# Patient Record
Sex: Female | Born: 1980 | Race: White | Hispanic: No | Smoking: Former smoker
Health system: Southern US, Community
[De-identification: ages and names within clinical notes are randomized; demographics above are authoritative.]

## PROBLEM LIST (undated history)

## (undated) DIAGNOSIS — F4310 Post-traumatic stress disorder, unspecified: Secondary | ICD-10-CM

## (undated) DIAGNOSIS — F329 Major depressive disorder, single episode, unspecified: Secondary | ICD-10-CM

## (undated) DIAGNOSIS — F32A Depression, unspecified: Secondary | ICD-10-CM

## (undated) DIAGNOSIS — F419 Anxiety disorder, unspecified: Secondary | ICD-10-CM

## (undated) DIAGNOSIS — R569 Unspecified convulsions: Secondary | ICD-10-CM

## (undated) DIAGNOSIS — M1990 Unspecified osteoarthritis, unspecified site: Secondary | ICD-10-CM

## (undated) HISTORY — PX: DILATION AND CURETTAGE OF UTERUS: SHX78

## (undated) HISTORY — DX: Anxiety disorder, unspecified: F41.9

## (undated) HISTORY — PX: TUBAL LIGATION: SHX77

---
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
## 2002-12-10 ENCOUNTER — Emergency Department (HOSPITAL_COMMUNITY): Admission: EM | Admit: 2002-12-10 | Discharge: 2002-12-11 | Payer: Self-pay | Admitting: Emergency Medicine

## 2002-12-11 ENCOUNTER — Encounter: Payer: Self-pay | Admitting: *Deleted

## 2004-11-07 ENCOUNTER — Emergency Department: Payer: Self-pay | Admitting: Emergency Medicine

## 2005-01-21 ENCOUNTER — Emergency Department: Payer: Self-pay | Admitting: Emergency Medicine

## 2005-06-30 ENCOUNTER — Observation Stay: Payer: Self-pay

## 2005-07-11 ENCOUNTER — Ambulatory Visit: Payer: Self-pay | Admitting: Obstetrics & Gynecology

## 2005-07-15 ENCOUNTER — Observation Stay: Payer: Self-pay

## 2005-08-26 ENCOUNTER — Inpatient Hospital Stay: Payer: Self-pay | Admitting: Unknown Physician Specialty

## 2005-12-24 ENCOUNTER — Emergency Department: Payer: Self-pay | Admitting: Emergency Medicine

## 2006-05-12 ENCOUNTER — Emergency Department: Payer: Self-pay | Admitting: Emergency Medicine

## 2006-11-25 HISTORY — PX: AUGMENTATION MAMMAPLASTY: SUR837

## 2007-07-14 ENCOUNTER — Emergency Department: Payer: Self-pay | Admitting: Emergency Medicine

## 2009-01-18 ENCOUNTER — Emergency Department (HOSPITAL_COMMUNITY): Admission: EM | Admit: 2009-01-18 | Discharge: 2009-01-18 | Payer: Self-pay | Admitting: Emergency Medicine

## 2009-05-22 ENCOUNTER — Ambulatory Visit: Payer: Self-pay | Admitting: Family Medicine

## 2010-06-14 ENCOUNTER — Emergency Department: Payer: Self-pay | Admitting: Emergency Medicine

## 2010-08-02 ENCOUNTER — Emergency Department (HOSPITAL_COMMUNITY): Admission: EM | Admit: 2010-08-02 | Discharge: 2010-08-02 | Payer: Self-pay | Admitting: Emergency Medicine

## 2011-03-12 LAB — URINE MICROSCOPIC-ADD ON

## 2011-03-12 LAB — URINALYSIS, ROUTINE W REFLEX MICROSCOPIC
Bilirubin Urine: NEGATIVE
Glucose, UA: NEGATIVE mg/dL
Ketones, ur: NEGATIVE mg/dL
Nitrite: NEGATIVE
Protein, ur: 30 mg/dL — AB
Specific Gravity, Urine: 1.02 (ref 1.005–1.030)
Urobilinogen, UA: 0.2 mg/dL (ref 0.0–1.0)
pH: 6.5 (ref 5.0–8.0)

## 2011-03-12 LAB — PREGNANCY, URINE: Preg Test, Ur: NEGATIVE

## 2012-05-11 ENCOUNTER — Encounter (HOSPITAL_COMMUNITY): Payer: Self-pay | Admitting: *Deleted

## 2012-05-11 ENCOUNTER — Emergency Department (HOSPITAL_COMMUNITY)
Admission: EM | Admit: 2012-05-11 | Discharge: 2012-05-11 | Disposition: A | Discharge status: Home / Self Care | Payer: Self-pay | Attending: Emergency Medicine | Admitting: Emergency Medicine

## 2012-05-11 DIAGNOSIS — T6391XA Toxic effect of contact with unspecified venomous animal, accidental (unintentional), initial encounter: Secondary | ICD-10-CM | POA: Insufficient documentation

## 2012-05-11 DIAGNOSIS — T63301A Toxic effect of unspecified spider venom, accidental (unintentional), initial encounter: Secondary | ICD-10-CM

## 2012-05-11 DIAGNOSIS — F17200 Nicotine dependence, unspecified, uncomplicated: Secondary | ICD-10-CM | POA: Insufficient documentation

## 2012-05-11 DIAGNOSIS — T63391A Toxic effect of venom of other spider, accidental (unintentional), initial encounter: Secondary | ICD-10-CM | POA: Insufficient documentation

## 2012-05-11 MED ORDER — DOXYCYCLINE HYCLATE 100 MG PO CAPS: 100.0000 mg | ORAL_CAPSULE | Freq: Two times a day (BID) | ORAL | Status: AC

## 2012-05-11 MED ORDER — NAPROXEN 500 MG PO TABS: 500.0000 mg | ORAL_TABLET | Freq: Two times a day (BID) | ORAL | Status: AC

## 2012-05-11 NOTE — Discharge Instructions (Signed)
Brown Recluse Spider Bite A brown recluse spider may be dark brown to light tan in color. It has a band of darker color shaped like a violin on its back. The whole spider (with legs) may grow to the size of 1 inch (2.5 cm). These spiders live undercover, outdoors, and in out-of-the-way places indoors. They can be found in the [place] on the [address], [place], and mostly in the [place]. Brown recluse spider bites can be serious and life-threatening. SYMPTOMS  Symptoms can get worse over several days and may include:  Pain at the bite site. This may begin as a small, painful blister with redness around it. The pain and sore (lesion) caused by the bite can increase and spread over time. This may result in an area of tissue death up to 12 inches (30 cm) wide.   General feeling of illness (malaise).   Nausea and vomiting.   Fever.   Body aches.  TREATMENT  Your caregiver may prescribe a drug to prevent tissue death or to treat your lesion. If a large lesion develops, surgery may be needed to remove the damaged tissue. Certain medicines and antibiotics may be prescribed depending on the severity of your illness. However, there is no single antidote to treat this bite. Treatment focuses on caring for your wound. HOME CARE INSTRUCTIONS   Do not scratch the bite area. Keep the area clean and covered with an adhesive bandage or sterile gauze bandage.   Wash the area daily in warm, soapy water.   Put ice or cool compresses on the bite area.   Put ice in a plastic bag.   Place a towel between your skin and the bag.   Leave the ice on for 20 to 30 minutes, 3 to 4 times a day or as directed.   Keep the bite area elevated above the level of your heart. This helps reduce swelling.   Take medicines as directed by your caregiver.  You may need a tetanus shot if:  You cannot remember when you had your last tetanus shot.   You have never had a tetanus shot.   The injury broke your skin.  If you  get a tetanus shot, your arm may swell, get red, and feel warm to the touch. This is common and not a problem. If you need a tetanus shot and you choose not to have one, there is a rare chance of getting tetanus. Sickness from tetanus can be serious. SEEK MEDICAL CARE IF:   Your symptoms do not improve in 24 hours or are getting worse.   You have increasing pain in the bite area.  SEEK IMMEDIATE MEDICAL CARE IF:   Your lesion appears to be getting larger (more than  inch [5 mm]), growing deeper, or looks infected.   You have chills or a fever.   You feel nauseous, vomit, have muscle aches, weakness, extreme tiredness, convulsions, or a red rash.   Your urine output decreases.   You have blood in your urine or notice other unusual bleeding.   Your skin turns yellow.  MAKE SURE YOU:   Understand these instructions.   Will watch your condition.   Will get help right away if you are not doing well or get worse.  Document Released: 11/11/2005 Document Revised: 10/31/2011 Document Reviewed: 06/12/2011 [facility] Patient Information 2012 [place], [place].  Not sure if it's a brown recluse bite oriented as a spider bite. Certainly not consistent with a black widow spider bite.  Take antibiotic as directed return for new or worse symptoms.

## 2012-05-11 NOTE — ED Notes (Signed)
"  spider bite" to rt thigh,

## 2012-05-11 NOTE — ED Provider Notes (Signed)
History  This chart was scribed for Shelda Jakes, MD by Bennett Scrape. This patient was seen in room APA11/APA11 and the patient's care was started at 1:58PM.  CSN: [id number]  Arrival date & time 05/11/12  1309   First MD Initiated Contact with Patient 05/11/12 1358      No chief complaint on file.   The history is provided by the patient. No language interpreter was used.    Kelsey Walton is a 31 y.o. female who presents to the Emergency Department complaining of a possible spider bite described as a black center with surrounding redness on her right thigh that she noticed approximately 30 minutes PTA. She denies pain but states that it is mildly itchy. She denies knowing the exact onset and states that she did not notice the bite yesterday. She denies seeing the insect that bite her. Pt states that she has been goggling her symptoms and was afraid that she was afraid that it was a brown recluse. She denies fever, abdominal pain and nausea as associated symptoms. Pt does not have a h/o chronic medical conditions. Pt is a current everyday smoker but denies alcohol use.   No current PCP.  History reviewed. No pertinent past medical history.  Past Surgical History  Procedure Date  . Tubal ligation     History reviewed. No pertinent family history.  History  Substance Use Topics  . Smoking status: Current Everyday Smoker  . Smokeless tobacco: Not on file  . Alcohol Use: No     Review of Systems  Constitutional: Negative for fever.  HENT: Negative for congestion and sore throat.   Eyes: Negative for discharge and redness.  Respiratory: Negative for cough and shortness of breath.   Cardiovascular: Negative for chest pain.  Gastrointestinal: Negative for vomiting, abdominal pain and diarrhea.  Genitourinary: Negative for dysuria.  Musculoskeletal: Negative for back pain.  Skin: Positive for wound. Negative for rash.  Neurological: Negative for dizziness, syncope and  headaches.  Psychiatric/Behavioral: Negative for behavioral problems and confusion.    Allergies  Review of patient's allergies indicates no known allergies.  Home Medications   Current Outpatient Rx  Name Route Sig Dispense Refill  . DOXYCYCLINE HYCLATE 100 MG PO CAPS Oral Take 1 capsule (100 mg total) by mouth 2 (two) times daily. 14 capsule 0  . NAPROXEN 500 MG PO TABS Oral Take 1 tablet (500 mg total) by mouth 2 (two) times daily. 14 tablet 0    Triage Vitals: BP 108/61  Pulse 90  Temp 98.3 F (36.8 C) (Oral)  Resp 20  Ht 5' (1.524 m)  Wt 112 lb (50.803 kg)  BMI 21.87 kg/m2  SpO2 100%  LMP 05/11/2012  Physical Exam  Nursing note and vitals reviewed. Constitutional: She is oriented to person, place, and time. She appears well-developed and well-nourished. No distress.  HENT:  Head: Normocephalic and atraumatic.  Eyes: Conjunctivae and EOM are normal.  Neck: Neck supple. No tracheal deviation present.  Cardiovascular: Normal rate and regular rhythm.   No murmur heard. Pulmonary/Chest: Effort normal and breath sounds normal. No respiratory distress.  Abdominal: Soft. Bowel sounds are normal.  Musculoskeletal: Normal range of motion.  Neurological: She is alert and oriented to person, place, and time.  Skin: Skin is warm and dry.       3 mm central black area with 12 mm of surrounding erythema on right thigh, no induration  Psychiatric: She has a normal mood and affect. Her behavior is normal.  ED Course  Procedures (including critical care time)  DIAGNOSTIC STUDIES: Oxygen Saturation is 100% on room air, normal by my interpretation.    COORDINATION OF CARE: 2:22PM-Discussed discharge plan of antibiotics with pt and pt agreed to plan.   Labs Reviewed - No data to display No results found.   1. Spider bite       MDM  Most likely spider bite cannot confirm does have a small area of blackness but no significant induration no purulence these 2 ports  carefully in case it is a brown recluse spider bite. Will treat patient with doxycycline patient has instructions return for any worsening symptoms also when this occurred is not known it difficult to judge timeline how much severity is occurred up to this point in time.      I personally performed the services described in this documentation, which was scribed in my presence. The recorded information has been reviewed and considered.     Shelda Jakes, MD 05/11/12 [phone]

## 2012-05-14 ENCOUNTER — Encounter (HOSPITAL_COMMUNITY): Payer: Self-pay | Admitting: *Deleted

## 2012-05-14 ENCOUNTER — Emergency Department (HOSPITAL_COMMUNITY)
Admission: EM | Admit: 2012-05-14 | Discharge: 2012-05-14 | Disposition: A | Discharge status: Home / Self Care | Payer: Self-pay | Attending: Emergency Medicine | Admitting: Emergency Medicine

## 2012-05-14 DIAGNOSIS — T63301A Toxic effect of unspecified spider venom, accidental (unintentional), initial encounter: Secondary | ICD-10-CM

## 2012-05-14 DIAGNOSIS — S90569A Insect bite (nonvenomous), unspecified ankle, initial encounter: Secondary | ICD-10-CM | POA: Insufficient documentation

## 2012-05-14 DIAGNOSIS — F17200 Nicotine dependence, unspecified, uncomplicated: Secondary | ICD-10-CM | POA: Insufficient documentation

## 2012-05-14 MED ORDER — HYDROCODONE-ACETAMINOPHEN 5-325 MG PO TABS: 1.0000 | ORAL_TABLET | Freq: Three times a day (TID) | ORAL | Status: AC | PRN

## 2012-05-14 MED ORDER — HYDROCODONE-ACETAMINOPHEN 5-325 MG PO TABS
1.0000 | ORAL_TABLET | Freq: Once | ORAL | Status: AC
  Administered 2012-05-14: 1 via ORAL
  Filled 2012-05-14: qty 1

## 2012-05-14 NOTE — ED Provider Notes (Signed)
History  This chart was scribed for Gerhard Munch, MD by Bennett Scrape. This patient was seen in room TR07C/TR07C and the patient's care was started at 3:23PM.  CSN: [id number]  Arrival date & time 05/14/12  1434   First MD Initiated Contact with Patient 05/14/12 1523      Chief Complaint  Patient presents with  . Insect Bite    The history is provided by the patient. No language interpreter was used.    Kelsey Walton is a 31 y.o. female who presents to the Emergency Department complaining of 4 days of sudden onset, gradually worsening, constant insect bite on right thigh with associated lethargy. she was seen 4 days ago and discharged with antibiotics. She reports that she has been taking the antibiotics but states that the area is now blistering, oozing and getting large. She reports that she has also been goggling "brown recluse bite symptoms" and is scared that she will lose her leg. She denies fever, chills and emesis as associated symptoms. She does not have a h/o chronic medical conditions. She is a current everyday smoker but denies alcohol use.   History reviewed. No pertinent past medical history.  Past Surgical History  Procedure Date  . Tubal ligation     History reviewed. No pertinent family history.  History  Substance Use Topics  . Smoking status: Current Everyday Smoker  . Smokeless tobacco: Not on file  . Alcohol Use: No     Review of Systems  Allergies  Review of patient's allergies indicates no known allergies.  Home Medications   Current Outpatient Rx  Name Route Sig Dispense Refill  . DOXYCYCLINE HYCLATE 100 MG PO CAPS Oral Take 1 capsule (100 mg total) by mouth 2 (two) times daily. 14 capsule 0  . NAPROXEN 500 MG PO TABS Oral Take 1 tablet (500 mg total) by mouth 2 (two) times daily. 14 tablet 0    Triage Vitals: BP 122/67  Pulse 88  Temp 97.9 F (36.6 C) (Oral)  Resp 16  SpO2 99%  LMP 05/11/2012  Physical Exam  Nursing note and  vitals reviewed. Constitutional: She is oriented to person, place, and time. She appears well-developed and well-nourished.  HENT:  Head: Normocephalic and atraumatic.  Eyes: Conjunctivae and EOM are normal.  Neck: Phonation normal.  Cardiovascular: Normal rate.   Pulmonary/Chest: Effort normal. No stridor. No respiratory distress.  Abdominal: She exhibits no distension.  Musculoskeletal: She exhibits no edema.  Neurological: She is alert and oriented to person, place, and time.  Skin: Skin is warm and dry.       12 cm erythematous area with a focal 4 cm indurated patch with weeping, very focal black 1 cm central area on right thigh  Psychiatric: She has a normal mood and affect. Her behavior is normal.    ED Course  Procedures (including critical care time)  COORDINATION OF CARE: 4:11PM-Discussed discharge plan with pt and pt agreed to plan.   Labs Reviewed - No data to display No results found.   No diagnosis found.    MDM  I personally performed the services described in this documentation, which was scribed in my presence. The recorded information has been reviewed and considered.  This female presents 4 days after the initial visit for a possible insect bite.  Notably, since the patient's initial presentation she notes increasing erythema focally about the leg, but she denies any concurrent fevers, chills, nausea, behavioral changes, vomiting or other overt stigmata of systemic  disease.  I discussed the natural course of wounds such as hers, although it is impossible to know the specific vector, at length.  Absent any of these aforementioned concerning findings, the patient is appropriate for continued by mouth antibiotics.  I delineated the margins of the wound today.  The patient's request for additional analgesics was accommodated.     Gerhard Munch, MD 05/14/12 [phone]

## 2012-05-14 NOTE — Discharge Instructions (Signed)
As discussed, it is very important that you continue to monitor the condition of your wound.  If you develop any new, or concerning changes in your condition, such as fever, nausea, vomiting, changes in cognition or mental status, please return to the emergency department immediately.  Otherwise, please make sure to call your physician tomorrow to insure appropriate followup care.

## 2012-05-14 NOTE — ED Notes (Signed)
Pt seen at [facility] after spider bite and was told to come back if symptoms worsened. States redness/swelling increased. Pt has black indentation at site of bite. States has taken antibiotic and pain medication without relief.

## 2012-05-14 NOTE — ED Notes (Addendum)
Pt states seen at [facility] and was told to watch smyptoms. Pt states that over the past three days she has been getting worse. Pt has a redneded area on her right outer thigh. Pt states worse today. Pt taking doxycycline and naprxsyn rx by [facility].

## 2013-09-08 ENCOUNTER — Emergency Department (HOSPITAL_COMMUNITY): Payer: Self-pay

## 2013-09-08 ENCOUNTER — Encounter (HOSPITAL_COMMUNITY): Payer: Self-pay | Admitting: Emergency Medicine

## 2013-09-08 ENCOUNTER — Emergency Department (HOSPITAL_COMMUNITY)
Admission: EM | Admit: 2013-09-08 | Discharge: 2013-09-08 | Disposition: A | Discharge status: Home / Self Care | Payer: Self-pay | Attending: Emergency Medicine | Admitting: Emergency Medicine

## 2013-09-08 DIAGNOSIS — S46909A Unspecified injury of unspecified muscle, fascia and tendon at shoulder and upper arm level, unspecified arm, initial encounter: Secondary | ICD-10-CM | POA: Insufficient documentation

## 2013-09-08 DIAGNOSIS — S8990XA Unspecified injury of unspecified lower leg, initial encounter: Secondary | ICD-10-CM | POA: Insufficient documentation

## 2013-09-08 DIAGNOSIS — F17200 Nicotine dependence, unspecified, uncomplicated: Secondary | ICD-10-CM | POA: Insufficient documentation

## 2013-09-08 DIAGNOSIS — Y92410 Unspecified street and highway as the place of occurrence of the external cause: Secondary | ICD-10-CM | POA: Insufficient documentation

## 2013-09-08 DIAGNOSIS — Y9389 Activity, other specified: Secondary | ICD-10-CM | POA: Insufficient documentation

## 2013-09-08 DIAGNOSIS — S4980XA Other specified injuries of shoulder and upper arm, unspecified arm, initial encounter: Secondary | ICD-10-CM | POA: Insufficient documentation

## 2013-09-08 DIAGNOSIS — S0990XA Unspecified injury of head, initial encounter: Secondary | ICD-10-CM | POA: Insufficient documentation

## 2013-09-08 MED ORDER — TRAMADOL HCL 50 MG PO TABS: 50.0000 mg | ORAL_TABLET | Freq: Four times a day (QID) | ORAL | Status: DC | PRN

## 2013-09-08 MED ORDER — HYDROCODONE-ACETAMINOPHEN 5-325 MG PO TABS
1.0000 | ORAL_TABLET | Freq: Once | ORAL | Status: AC
  Administered 2013-09-08: 1 via ORAL
  Filled 2013-09-08: qty 1

## 2013-09-08 NOTE — ED Notes (Signed)
Pt request pain medication for facial pain. EDP aware and pain med ordered
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
## 2013-09-08 NOTE — Progress Notes (Signed)
ED/CM noted patient did not have health insurance and/or PCP listed in the computer.  Patient was given the [place] resource handout with information on the clinics, food pantries, and the handout for new health insurance sign-up.  Patient expressed appreciation for this. 
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
## 2015-07-10 ENCOUNTER — Telehealth: Payer: Self-pay | Admitting: *Deleted

## 2015-07-10 ENCOUNTER — Encounter: Payer: Self-pay | Admitting: *Deleted

## 2015-07-10 ENCOUNTER — Ambulatory Visit
Admission: RE | Admit: 2015-07-10 | Discharge: 2015-07-10 | Disposition: A | Discharge status: Home / Self Care | Payer: Medicaid Other | Source: Ambulatory Visit | Attending: Oncology | Admitting: Oncology

## 2015-07-10 ENCOUNTER — Ambulatory Visit: Payer: Self-pay | Attending: Oncology | Admitting: *Deleted

## 2015-07-10 ENCOUNTER — Other Ambulatory Visit: Payer: Self-pay | Admitting: *Deleted

## 2015-07-10 ENCOUNTER — Ambulatory Visit: Payer: Self-pay

## 2015-07-10 VITALS — BP 106/72 | HR 78 | Temp 97.7°F | Resp 16 | Ht 62.6 in | Wt 129.9 lb

## 2015-07-10 DIAGNOSIS — N63 Unspecified lump in unspecified breast: Secondary | ICD-10-CM

## 2015-07-10 NOTE — Telephone Encounter (Signed)
Called and discussed mammogram and ultrasound results and need for surgical consult as we had discussed earlier today.  Appointment scheduled to see Dr. Bary Castilla on 07/17/15 @ 1130.  She is to arrive 30 minutes early to complete paperwork.  She is to take a photo ID and all her meds with her to the appointment.  Will follow-up per protocol.

## 2015-07-10 NOTE — Patient Instructions (Signed)
Gave patient hand-out, Women Staying Healthy, Active and Well from [facility], with education on breast health, pap smears, heart and colon health. 

## 2015-07-10 NOTE — Progress Notes (Signed)
Subjective:     Patient ID: Kelsey Walton, female   DOB: 1981/03/19, 34 y.o.   MRN: [id number]  HPI   Review of Systems     Objective:   Physical Exam  Pulmonary/Chest: Right breast exhibits mass. Right breast exhibits no inverted nipple, no nipple discharge, no skin change and no tenderness. Left breast exhibits no inverted nipple, no mass, no nipple discharge, no skin change and no tenderness. Breasts are symmetrical.         Assessment:     34 year old White female referred to [facility] by Serafina Royals, DO at [facility] for keratosis and possible area of breast concerns.  Patient states she had bilateral breast implants inserted about 8 years ago. States they are behind the muscle.  On visual inspection of the breast exam,  I see a small nodule in the upper outer quadrant of the right breast.  I can palpate an approximate 0.5 cm nodule at 10:00 right breast 4-5 cm from the areola.  I can also palpate tender probable lymphadenopathy in the right axillary tail.  Taught self breast awareness.  Patient has been screened for eligibility.  She does not have any insurance, Medicare or Medicaid.  She also meets financial eligibility.  Hand-out given on the Affordable Care Act.    Plan:     Willl get bilateral diagnostic mammogram with ultrasound.  Discussed with patient that if there are no findings on imaging, I will refer her on for surgical consult for a second opinion.  She is agreeable.  Will follow-up per protocol.

## 2015-07-17 ENCOUNTER — Encounter: Payer: Self-pay | Admitting: General Surgery

## 2015-07-17 ENCOUNTER — Other Ambulatory Visit: Payer: PRIVATE HEALTH INSURANCE

## 2015-07-17 ENCOUNTER — Ambulatory Visit (INDEPENDENT_AMBULATORY_CARE_PROVIDER_SITE_OTHER): Payer: PRIVATE HEALTH INSURANCE | Admitting: General Surgery

## 2015-07-17 VITALS — BP 100/60 | HR 60 | Resp 14 | Ht 60.0 in | Wt 132.0 lb

## 2015-07-17 DIAGNOSIS — N6310 Unspecified lump in the right breast, unspecified quadrant: Secondary | ICD-10-CM

## 2015-07-17 DIAGNOSIS — N63 Unspecified lump in breast: Secondary | ICD-10-CM

## 2015-07-17 NOTE — Progress Notes (Signed)
Patient ID: Kelsey Walton, female   DOB: Aug 02, 1981, 34 y.o.   MRN: [id number]  Chief Complaint  Patient presents with  . Other    Right Breast Mass    HPI Kelsey Walton is a 34 y.o. female for evaluation of 2 right breast masses. Her most recent mammogram and ultrasound was done on 07/10/15. She does monthly breast checks. This was her first mammogram. She found the masses in her right breast about 3 weeks ago. She does have bilateral breast implants completed 8 years ago. She states that the areas are tender to palpation. She denies any pain with movement. She denies any injury to the breasts.  HPI  Past Medical History  Diagnosis Date  . Anxiety     Past Surgical History  Procedure Laterality Date  . Tubal ligation    . Augmentation mammaplasty Bilateral 2008    saline    Family History  Problem Relation Age of Onset  . Breast cancer Maternal Aunt     great aunt  . Leukemia Maternal Grandfather   . Prostate cancer Maternal Grandfather   . Multiple myeloma Father     lung, neck, bone    Social History Social History  Substance Use Topics  . Smoking status: Former Smoker    Quit date: 12/15/2012  . Smokeless tobacco: Never Used  . Alcohol Use: No    No Known Allergies  Current Outpatient Prescriptions  Medication Sig Dispense Refill  . ALPRAZolam (XANAX) 1 MG tablet Take 1 mg by mouth daily.     No current facility-administered medications for this visit.    Review of Systems Review of Systems  Constitutional: Negative.   Respiratory: Negative.   Cardiovascular: Negative.     Blood pressure 100/60, pulse 60, resp. rate 14, height 5' (1.524 m), weight 132 lb (59.875 kg), last menstrual period 07/01/2015.  Physical Exam Physical Exam  Constitutional: She is oriented to person, place, and time. She appears well-developed and well-nourished.  Eyes: Conjunctivae are normal. No scleral icterus.  Neck: Neck supple.  Cardiovascular: Normal rate, regular rhythm  and normal heart sounds.   Pulmonary/Chest: Effort normal and breath sounds normal. Right breast exhibits no inverted nipple, no mass, no nipple discharge, no skin change and no tenderness. Left breast exhibits no inverted nipple, no mass, no nipple discharge, no skin change and no tenderness.  Lymphadenopathy:    She has no cervical adenopathy.  1 cm lymph gland in the left axilla. 1 cm node in the right axilla.  nodule in the upper outer quadrant of the right breast 10 o'clk, 10 cm from the nipple.     Neurological: She is alert and oriented to person, place, and time.  Skin: Skin is warm and dry.  Psychiatric: She has a normal mood and affect.    Data Reviewed Bilateral mammograms and right breast ultrasound dated 07/10/2015 was reviewed. 2 nodular densities are noted in the upper-outer quadrant on lateral views measuring 10 and 5 mm respectively. Ultrasound suggested intra-mammary lymph nodes for which six-month follow-up was recommended. BI-RADS-3.  Ultrasound examination of the upper-outer quadrant of the right breast confirmed a dominant nodule measuring 0.33 x 0.83 x 0.96 cm. Its echo appearance is that of a lymph node with a hyperechoic: Hypoechoic cortex. Posterior acoustic enhancement is noted. FNA sampling was completed using 1 mL of 1% plain Xylocaine. Multiple passes through the lesion with a 22-gauge needle were completed under ultrasound guidance with no violation of the underlying pectoralis muscle  or implant. The procedure was well tolerated. Slides 4 were prepared.  Assessment    Right breast nodule, likely intramammary lymph node.    Plan    The clinical exam and imaging studies correspond to a now mildly symptomatic lymph node. The patient is very anxious about this issue, as she is unaware of any episodes of injury or inflammation involving the breast or upper extremity to account for their recent development.  Options for management were reviewed: 1) 6 month  follow-up as recommended by radiology with intervention if enlargement at that time versus 2) FNA sampling followed by six-month follow-up ultrasound to confirm the clinical impression of a benign lymph node.  The patient elected to proceed to FNA sampling.  She will be contacted when the cytology results are available and if normal will arrange for a follow-up ultrasound in 6 months. The importance of avoiding manipulation of the area has been emphasized.    PCP: Serafina Royals Ref: Lady Deutscher 07/17/2015, 1:15 PM

## 2015-07-17 NOTE — Patient Instructions (Signed)
We will call with results. You may remove the bandage tomorrow. Ice to area if needed.

## 2015-07-20 ENCOUNTER — Telehealth: Payer: Self-pay | Admitting: General Surgery

## 2015-07-20 LAB — FINE-NEEDLE ASPIRATION

## 2015-07-20 NOTE — Telephone Encounter (Signed)
x

## 2015-07-21 ENCOUNTER — Telehealth: Payer: Self-pay

## 2015-07-21 NOTE — Telephone Encounter (Signed)
Notified patient as instructed, patient pleased. Discussed follow-up in 6 months, patient agrees. Patient placed in recalls.

## 2015-07-21 NOTE — Telephone Encounter (Signed)
-----   Message from Robert Bellow, MD sent at 07/21/2015  9:09 AM EDT ----- Please notify the patient of the benign results. She should plan on a 6 month follow-up office visit. Thank you ----- Message -----    From: [facility] Lab Results In Interface    Sent: 07/20/2015   5:39 AM      To: Robert Bellow, MD

## 2015-07-24 ENCOUNTER — Encounter: Payer: Self-pay | Admitting: *Deleted

## 2015-07-24 NOTE — Progress Notes (Signed)
Patient notified of benign pathology by Dr. Dwyane Luo office.  To follow-up with him in 6 months.  HSIS to [place].

## 2015-11-23 ENCOUNTER — Encounter: Payer: Self-pay | Admitting: *Deleted

## 2016-01-10 ENCOUNTER — Ambulatory Visit: Payer: PRIVATE HEALTH INSURANCE | Admitting: General Surgery

## 2016-01-11 ENCOUNTER — Ambulatory Visit: Payer: PRIVATE HEALTH INSURANCE | Admitting: General Surgery

## 2016-03-21 ENCOUNTER — Encounter: Payer: Self-pay | Admitting: *Deleted

## 2017-02-18 ENCOUNTER — Emergency Department (HOSPITAL_COMMUNITY)
Admission: EM | Admit: 2017-02-18 | Discharge: 2017-02-18 | Disposition: A | Discharge status: Home / Self Care | Payer: Self-pay | Attending: Emergency Medicine | Admitting: Emergency Medicine

## 2017-02-18 ENCOUNTER — Emergency Department (HOSPITAL_COMMUNITY): Payer: Self-pay

## 2017-02-18 ENCOUNTER — Encounter (HOSPITAL_COMMUNITY): Payer: Self-pay | Admitting: Emergency Medicine

## 2017-02-18 DIAGNOSIS — Z79899 Other long term (current) drug therapy: Secondary | ICD-10-CM | POA: Insufficient documentation

## 2017-02-18 DIAGNOSIS — Z87891 Personal history of nicotine dependence: Secondary | ICD-10-CM | POA: Insufficient documentation

## 2017-02-18 DIAGNOSIS — N7011 Chronic salpingitis: Secondary | ICD-10-CM | POA: Insufficient documentation

## 2017-02-18 LAB — COMPREHENSIVE METABOLIC PANEL
ALT: 13 U/L — ABNORMAL LOW (ref 14–54)
ANION GAP: 5 (ref 5–15)
AST: 17 U/L (ref 15–41)
Albumin: 4.2 g/dL (ref 3.5–5.0)
Alkaline Phosphatase: 54 U/L (ref 38–126)
BUN: 12 mg/dL (ref 6–20)
CHLORIDE: 105 mmol/L (ref 101–111)
CO2: 27 mmol/L (ref 22–32)
CREATININE: 0.78 mg/dL (ref 0.44–1.00)
Calcium: 9.3 mg/dL (ref 8.9–10.3)
Glucose, Bld: 92 mg/dL (ref 65–99)
POTASSIUM: 4.2 mmol/L (ref 3.5–5.1)
SODIUM: 137 mmol/L (ref 135–145)
Total Bilirubin: 0.4 mg/dL (ref 0.3–1.2)
Total Protein: 7.4 g/dL (ref 6.5–8.1)

## 2017-02-18 LAB — CBC
HCT: 41.2 % (ref 36.0–46.0)
HEMOGLOBIN: 13.6 g/dL (ref 12.0–15.0)
MCH: 29 pg (ref 26.0–34.0)
MCHC: 33 g/dL (ref 30.0–36.0)
MCV: 87.8 fL (ref 78.0–100.0)
Platelets: 263 10*3/uL (ref 150–400)
RBC: 4.69 MIL/uL (ref 3.87–5.11)
RDW: 12.7 % (ref 11.5–15.5)
WBC: 4.6 10*3/uL (ref 4.0–10.5)

## 2017-02-18 LAB — URINALYSIS, ROUTINE W REFLEX MICROSCOPIC
BILIRUBIN URINE: NEGATIVE
Glucose, UA: NEGATIVE mg/dL
HGB URINE DIPSTICK: NEGATIVE
KETONES UR: NEGATIVE mg/dL
Leukocytes, UA: NEGATIVE
Nitrite: NEGATIVE
PROTEIN: NEGATIVE mg/dL
Specific Gravity, Urine: 1.023 (ref 1.005–1.030)
pH: 5 (ref 5.0–8.0)

## 2017-02-18 LAB — WET PREP, GENITAL
Sperm: NONE SEEN
Trich, Wet Prep: NONE SEEN
Yeast Wet Prep HPF POC: NONE SEEN

## 2017-02-18 LAB — HCG, QUANTITATIVE, PREGNANCY

## 2017-02-18 LAB — LIPASE, BLOOD: LIPASE: 57 U/L — AB (ref 11–51)

## 2017-02-18 MED ORDER — DOXYCYCLINE HYCLATE 100 MG PO CAPS: 100.0000 mg | ORAL_CAPSULE | Freq: Two times a day (BID) | ORAL | 0 refills | Status: DC

## 2017-02-18 MED ORDER — ONDANSETRON 4 MG PO TBDP
4.0000 mg | ORAL_TABLET | Freq: Once | ORAL | Status: AC
  Administered 2017-02-18: 4 mg via ORAL
  Filled 2017-02-18: qty 1

## 2017-02-18 MED ORDER — LIDOCAINE HCL (PF) 1 % IJ SOLN
INTRAMUSCULAR | Status: AC
  Filled 2017-02-18: qty 5

## 2017-02-18 MED ORDER — HYDROCODONE-ACETAMINOPHEN 5-325 MG PO TABS
1.0000 | ORAL_TABLET | Freq: Once | ORAL | Status: AC
  Administered 2017-02-18: 1 via ORAL
  Filled 2017-02-18: qty 1

## 2017-02-18 MED ORDER — CEFTRIAXONE SODIUM 1 G IJ SOLR
1.0000 g | Freq: Once | INTRAMUSCULAR | Status: AC
  Administered 2017-02-18: 1 g via INTRAMUSCULAR
  Filled 2017-02-18: qty 10

## 2017-02-18 MED ORDER — HYDROCODONE-ACETAMINOPHEN 5-325 MG PO TABS: 1.0000 | ORAL_TABLET | ORAL | 0 refills | Status: DC | PRN

## 2017-02-18 MED ORDER — FLUCONAZOLE 150 MG PO TABS: 150.0000 mg | ORAL_TABLET | Freq: Once | ORAL | 0 refills | Status: AC

## 2017-02-18 NOTE — ED Provider Notes (Signed)
[facility] DEPT Provider Note   CSN: [id number] Arrival date & time: 02/18/17  1318     History   Chief Complaint Chief Complaint  Patient presents with  . Abdominal Pain    HPI Kelsey Walton is a 36 y.o. female.  HPI Patient presents with back pain that radiates from her abdomen. 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 States that she gets back pain when she works. 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 States she had a busy weekend and hurt more. States now the pain goes to her abdomen. No dysuria. No diarrhea or constipation. Worse with movements. No vaginal bleeding or discharge. No loss of bladder bowel control. No weakness. Pain is worse with movements. States she did have unprotected sex previously. She did not really quantify the time for this. Pain is dull and constant. She states she is pregnant in the past and only showed up on blood work not in her urine.   Past Medical History:  Diagnosis Date  . Anxiety     Patient Active Problem List   Diagnosis Date Noted  . Breast mass, right 07/17/2015    Past Surgical History:  Procedure Laterality Date  . AUGMENTATION MAMMAPLASTY Bilateral 2008   saline  . TUBAL LIGATION      OB History    Gravida Para Term Preterm AB Living   _0 SAB TAB Ectopic Multiple Live Births                  Obstetric Comments   Menstrual age:   Age 1st Pregnancy: 37        Home Medications    Prior to Admission medications   Medication Sig Start Date End Date Taking? Authorizing Provider  ALPRAZolam Duanne Moron) 1 MG tablet Take 1 mg by mouth 2 (two) times daily.    Yes Historical Provider, MD  ibuprofen (ADVIL,MOTRIN) 200 MG tablet Take 800 mg by mouth every 6 (six) hours as needed for mild pain or moderate pain.   Yes Historical Provider, MD  doxycycline (VIBRAMYCIN) 100 MG capsule Take 1 capsule (100 mg total) by mouth 2 (two) times daily. 02/18/17   Davonna Belling, MD  fluconazole  (DIFLUCAN) 150 MG tablet Take 1 tablet (150 mg total) by mouth once. 02/26/17 02/26/17  Davonna Belling, MD  HYDROcodone-acetaminophen (NORCO/VICODIN) 5-325 MG tablet Take 1-2 tablets by mouth every 4 (four) hours as needed. 02/18/17   Davonna Belling, MD    Family History Family History  Problem Relation Age of Onset  . Breast cancer Maternal Aunt     great aunt  . Leukemia Maternal Grandfather   . Prostate cancer Maternal Grandfather   . Multiple myeloma Father     lung, neck, bone    Social History Social History  Substance Use Topics  . Smoking status: Former Smoker    Quit date: 12/15/2012  . Smokeless tobacco: Never Used  . Alcohol use No     Allergies   Patient has no known allergies.   Review of Systems Review of Systems  Constitutional: Negative for appetite change and fever.  HENT: Negative for congestion.   Respiratory: Negative for chest tightness and shortness of breath.   Cardiovascular: Negative for chest pain.  Gastrointestinal: Positive for abdominal pain.  Genitourinary: Positive for pelvic pain. Negative for decreased urine volume, flank pain, vaginal bleeding and vaginal discharge.  Musculoskeletal: Positive for back pain.  Neurological: Negative for numbness.  Psychiatric/Behavioral: Negative for agitation and confusion.     Physical Exam Updated Vital Signs BP (!) 116/102   Pulse 63   Temp 98.2 F (36.8 C) (Oral)   Resp 16   Ht _0  (1.575 m)   Wt 125 lb (56.7 kg)   LMP 02/09/2017   SpO2 100%   BMI 22.86 kg/m   Physical Exam  Constitutional: She appears well-developed.  HENT:  Head: Atraumatic.  Eyes: Pupils are equal, round, and reactive to light.  Neck: Neck supple.  Cardiovascular: Normal rate.   Pulmonary/Chest: Effort normal.  Abdominal: There is tenderness.  Suprapubic right sided tenderness. No mass. No hernia palpated. No CVA tenderness. Mild lower lumbar tenderness on right side.  Musculoskeletal: She exhibits no edema.    Skin: Capillary refill takes less than 2 seconds.  Pelvic exam showed minimal white discharge in right adnexal tenderness. No cervical motion tenderness.   ED Treatments / Results  Labs (all labs ordered are listed, but only abnormal results are displayed) Labs Reviewed  WET PREP, GENITAL - Abnormal; Notable for the following:       Result Value   Clue Cells Wet Prep HPF POC PRESENT (*)    WBC, Wet Prep HPF POC MODERATE (*)    All other components within normal limits  LIPASE, BLOOD - Abnormal; Notable for the following:    Lipase 57 (*)    All other components within normal limits  COMPREHENSIVE METABOLIC PANEL - Abnormal; Notable for the following:    ALT 13 (*)    All other components within normal limits  CBC  URINALYSIS, ROUTINE W REFLEX MICROSCOPIC  HCG, QUANTITATIVE, PREGNANCY  RPR  HIV ANTIBODY (ROUTINE TESTING)  GC/CHLAMYDIA PROBE AMP ([place]) NOT AT [facility]    EKG  EKG Interpretation None       Radiology US Transvaginal Non-ob  Result Date: 02/18/2017 CLINICAL DATA:  36 year old female with right lower quadrant pain for 2 days tubal ligation 12 years prior. LMP 02/04/2017. EXAM: TRANSABDOMINAL AND TRANSVAGINAL ULTRASOUND OF PELVIS DOPPLER ULTRASOUND OF OVARIES TECHNIQUE: Both transabdominal and transvaginal ultrasound examinations of the pelvis were performed. Transabdominal technique was performed for global imaging of the pelvis including uterus, ovaries, adnexal regions, and pelvic cul-de-sac. It was necessary to proceed with endovaginal exam following the transabdominal exam to visualize the endometrium, myometrium and adnexa. Color and duplex Doppler ultrasound was utilized to evaluate blood flow to the ovaries. COMPARISON:  01/22/2005 obstetric scan. FINDINGS: Uterus Measurements: 8.1 x 4.2 x 5.6 cm. Anteverted uterus. Right posterior upper uterine body 2.1 x 1.6 x 2.1 cm intramural fibroid. No additional fibroids. No additional myometrial abnormalities.  Endometrium Thickness: 15 mm. Mildly heterogeneous endometrium with no endometrial cavity fluid or focal endometrial mass demonstrated. Right ovary Measurements: 3.9 x 2.1 x 2.8 cm. Normal right ovary with no right ovarian mass. 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 Mild wall thickening associated with the right hydrosalpinx, cannot exclude acute salpingitis. Left ovary Measurements: 2.5 x 1.2 x 2.5 cm. Normal appearance/no adnexal mass. Pulsed Doppler evaluation of both ovaries demonstrates normal low-resistance arterial and venous waveforms. Other findings No abnormal free fluid. IMPRESSION: 1. Normal ovaries.  No evidence of adnexal torsion. 2. 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 Mild  wall thickening associated with the right hydrosalpinx, cannot exclude acute salpingitis/ pelvic inflammatory disease. No tubo-ovarian abscess. 3. Solitary intramural right uterine fibroid. 4. Bilayer endometrial thickness 15 mm, upper normal. No endometrial cavity fluid. No focal endometrial mass. Electronically Signed   By: Ilona Sorrel M.D.   On: 02/18/2017 18:15   US Pelvis Complete  Result Date: 02/18/2017 CLINICAL DATA:  36 year old female with right lower quadrant pain for 2 days tubal ligation 12 years prior. LMP 02/04/2017. EXAM: TRANSABDOMINAL AND TRANSVAGINAL ULTRASOUND OF PELVIS DOPPLER ULTRASOUND OF OVARIES TECHNIQUE: Both transabdominal and transvaginal ultrasound examinations of the pelvis were performed. Transabdominal technique was performed for global imaging of the pelvis including uterus, ovaries, adnexal regions, and pelvic cul-de-sac. It was necessary to proceed with endovaginal exam following the transabdominal exam to visualize the  endometrium, myometrium and adnexa. Color and duplex Doppler ultrasound was utilized to evaluate blood flow to the ovaries. COMPARISON:  01/22/2005 obstetric scan. FINDINGS: Uterus Measurements: 8.1 x 4.2 x 5.6 cm. Anteverted uterus. Right posterior upper uterine body 2.1 x 1.6 x 2.1 cm intramural fibroid. No additional fibroids. No additional myometrial abnormalities. Endometrium Thickness: 15 mm. Mildly heterogeneous endometrium with no endometrial cavity fluid or focal endometrial mass demonstrated. Right ovary Measurements: 3.9 x 2.1 x 2.8 cm. Normal right ovary with no right ovarian mass. 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 Mild wall thickening associated with the right hydrosalpinx, cannot exclude acute salpingitis. Left ovary Measurements: 2.5 x 1.2 x 2.5 cm. Normal appearance/no adnexal mass. Pulsed Doppler evaluation of both ovaries demonstrates normal low-resistance arterial and venous waveforms. Other findings No abnormal free fluid. IMPRESSION: 1. Normal ovaries.  No evidence of adnexal torsion. 2. 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 Mild wall thickening associated with the right hydrosalpinx, cannot exclude acute salpingitis/ pelvic inflammatory disease. No tubo-ovarian abscess. 3. Solitary intramural right uterine fibroid. 4. Bilayer endometrial thickness 15 mm, upper normal. No endometrial cavity fluid. No focal endometrial mass. Electronically Signed   By: Ilona Sorrel M.D.   On: 02/18/2017 18:15   [place] Art/ven Flow Abd Pelv Doppler  Result Date: 02/18/2017 CLINICAL DATA:  36 year old female with right lower quadrant pain for 2 days tubal ligation 12 years prior. LMP 02/04/2017. EXAM: TRANSABDOMINAL AND TRANSVAGINAL  ULTRASOUND OF PELVIS DOPPLER ULTRASOUND OF OVARIES TECHNIQUE: Both transabdominal and transvaginal ultrasound examinations of the pelvis were performed. Transabdominal technique was performed for global imaging of the pelvis including uterus, ovaries, adnexal regions, and pelvic cul-de-sac. It was necessary to proceed with endovaginal exam following the transabdominal exam to visualize the endometrium, myometrium and adnexa. Color and duplex Doppler ultrasound was utilized to evaluate blood flow to the ovaries. COMPARISON:  01/22/2005 obstetric scan. FINDINGS: Uterus Measurements: 8.1 x 4.2 x 5.6 cm. Anteverted uterus. Right posterior upper uterine body 2.1 x 1.6 x 2.1 cm intramural fibroid. No additional fibroids. No additional myometrial abnormalities. Endometrium Thickness: 15 mm. Mildly heterogeneous endometrium with no endometrial cavity fluid or focal endometrial mass demonstrated. Right ovary Measurements: 3.9 x 2.1 x 2.8 cm. Normal right ovary with no right ovarian mass. There is a large tubular cystic structure in the right adnexa between the right ovary in uterus measuring 5.2 x 2.6 x 4.8 cm in maximum dimensions, with incomplete thickened eccentric internal septations, compatible with  moderate to large right hydrosalpinx. Mild wall thickening associated with the right hydrosalpinx, cannot exclude acute salpingitis. Left ovary Measurements: 2.5 x 1.2 x 2.5 cm. Normal appearance/no adnexal mass. Pulsed Doppler evaluation of both ovaries demonstrates normal low-resistance arterial and venous waveforms. Other findings No abnormal free fluid. IMPRESSION: 1. Normal ovaries.  No evidence of adnexal torsion. 2. 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 Mild wall thickening associated with the right hydrosalpinx, cannot exclude acute salpingitis/ pelvic inflammatory disease. No tubo-ovarian abscess. 3. Solitary intramural right uterine fibroid. 4. Bilayer  endometrial thickness 15 mm, upper normal. No endometrial cavity fluid. No focal endometrial mass. Electronically Signed   By: Ilona Sorrel M.D.   On: 02/18/2017 18:15    Procedures Procedures (including critical care time)  Medications Ordered in ED Medications  HYDROcodone-acetaminophen (NORCO/VICODIN) 5-325 MG per tablet 1 tablet (1 tablet Oral Given 02/18/17 1858)  cefTRIAXone (ROCEPHIN) injection 1 g (1 g Intramuscular Given 02/18/17 1858)  ondansetron (ZOFRAN-ODT) disintegrating tablet 4 mg (4 mg Oral Given 02/18/17 1952)     Initial Impression / Assessment and Plan / ED Course  I have reviewed the triage vital signs and the nursing notes.  Pertinent labs & imaging results that were available during my care of the patient were reviewed by me and considered in my medical decision making (see chart for details).     Patient presents with back and right pelvic pain. May be muscular skull component to the back pain but also found to have hydrosalpinx on the right. Discuss with Dr. Elonda Husky, and will empirically treat with Rocephin and doxycycline. Ultrasound said this is not a tubo-ovarian abscess. Discharge home to follow-up in one week.  Final Clinical Impressions(s) / ED Diagnoses   Final diagnoses:  Hydrosalpinx    New Prescriptions Discharge Medication List as of 02/18/2017  7:26 PM    START taking these medications   Details  doxycycline (VIBRAMYCIN) 100 MG capsule Take 1 capsule (100 mg total) by mouth 2 (two) times daily., Starting Tue 02/18/2017, Print    fluconazole (DIFLUCAN) 150 MG tablet Take 1 tablet (150 mg total) by mouth once., Starting Wed 02/26/2017, Print    HYDROcodone-acetaminophen (NORCO/VICODIN) 5-325 MG tablet Take 1-2 tablets by mouth every 4 (four) hours as needed., Starting Tue 02/18/2017, Print         Davonna Belling, MD 02/18/17 [phone]

## 2017-02-18 NOTE — ED Triage Notes (Signed)
Pt c/o rlq abd pain since yesterday. Denies n/v/d. Denies vaginal bleeding or discharge. nad noted.

## 2017-02-18 NOTE — ED Notes (Signed)
This nurse assisted Dr. Alvino Chapel with pelvic examination.

## 2017-02-18 NOTE — Discharge Instructions (Signed)
Follow-up with gynecology. Return for worsening pain or fevers.

## 2017-02-19 LAB — HIV ANTIBODY (ROUTINE TESTING W REFLEX): HIV SCREEN 4TH GENERATION: NONREACTIVE

## 2017-02-19 LAB — RPR: RPR Ser Ql: NONREACTIVE

## 2017-02-20 LAB — GC/CHLAMYDIA PROBE AMP (~~LOC~~) NOT AT ARMC
CHLAMYDIA, DNA PROBE: NEGATIVE
NEISSERIA GONORRHEA: NEGATIVE

## 2018-04-03 ENCOUNTER — Emergency Department (HOSPITAL_COMMUNITY)
Admission: EM | Admit: 2018-04-03 | Discharge: 2018-04-03 | Disposition: A | Discharge status: Home / Self Care | Payer: No Typology Code available for payment source | Attending: Emergency Medicine | Admitting: Emergency Medicine

## 2018-04-03 ENCOUNTER — Other Ambulatory Visit: Payer: Self-pay

## 2018-04-03 ENCOUNTER — Encounter (HOSPITAL_COMMUNITY): Payer: Self-pay | Admitting: *Deleted

## 2018-04-03 ENCOUNTER — Emergency Department (HOSPITAL_COMMUNITY): Payer: No Typology Code available for payment source

## 2018-04-03 DIAGNOSIS — M545 Low back pain: Secondary | ICD-10-CM | POA: Insufficient documentation

## 2018-04-03 DIAGNOSIS — M7918 Myalgia, other site: Secondary | ICD-10-CM | POA: Diagnosis not present

## 2018-04-03 DIAGNOSIS — Z87891 Personal history of nicotine dependence: Secondary | ICD-10-CM | POA: Insufficient documentation

## 2018-04-03 DIAGNOSIS — Z79899 Other long term (current) drug therapy: Secondary | ICD-10-CM | POA: Insufficient documentation

## 2018-04-03 DIAGNOSIS — F419 Anxiety disorder, unspecified: Secondary | ICD-10-CM | POA: Diagnosis not present

## 2018-04-03 DIAGNOSIS — M542 Cervicalgia: Secondary | ICD-10-CM | POA: Diagnosis present

## 2018-04-03 DIAGNOSIS — M25561 Pain in right knee: Secondary | ICD-10-CM | POA: Insufficient documentation

## 2018-04-03 LAB — URINALYSIS, ROUTINE W REFLEX MICROSCOPIC
Bilirubin Urine: NEGATIVE
GLUCOSE, UA: NEGATIVE mg/dL
HGB URINE DIPSTICK: NEGATIVE
Ketones, ur: NEGATIVE mg/dL
LEUKOCYTES UA: NEGATIVE
Nitrite: NEGATIVE
PROTEIN: NEGATIVE mg/dL
SPECIFIC GRAVITY, URINE: 1.012 (ref 1.005–1.030)
pH: 5 (ref 5.0–8.0)

## 2018-04-03 LAB — POC URINE PREG, ED: Preg Test, Ur: NEGATIVE

## 2018-04-03 MED ORDER — ONDANSETRON 4 MG PO TBDP
4.0000 mg | ORAL_TABLET | Freq: Once | ORAL | Status: AC
  Administered 2018-04-03: 4 mg via ORAL
  Filled 2018-04-03: qty 1

## 2018-04-03 MED ORDER — ONDANSETRON 4 MG PO TBDP: 4.0000 mg | ORAL_TABLET | Freq: Three times a day (TID) | ORAL | 0 refills | Status: DC | PRN

## 2018-04-03 MED ORDER — TRAMADOL HCL 50 MG PO TABS
50.0000 mg | ORAL_TABLET | Freq: Once | ORAL | Status: AC
  Administered 2018-04-03: 50 mg via ORAL
  Filled 2018-04-03: qty 1

## 2018-04-03 MED ORDER — HYDROCODONE-ACETAMINOPHEN 5-325 MG PO TABS: 1.0000 | ORAL_TABLET | ORAL | 0 refills | Status: DC | PRN

## 2018-04-03 MED ORDER — HYDROCODONE-ACETAMINOPHEN 5-325 MG PO TABS
1.0000 | ORAL_TABLET | Freq: Once | ORAL | Status: AC
  Administered 2018-04-03: 1 via ORAL
  Filled 2018-04-03: qty 1

## 2018-04-03 MED ORDER — IBUPROFEN 600 MG PO TABS: 600.0000 mg | ORAL_TABLET | Freq: Four times a day (QID) | ORAL | 0 refills | Status: DC | PRN

## 2018-04-03 MED ORDER — CYCLOBENZAPRINE HCL 5 MG PO TABS: 5.0000 mg | ORAL_TABLET | Freq: Three times a day (TID) | ORAL | 0 refills | Status: DC | PRN
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
## 2019-06-07 ENCOUNTER — Other Ambulatory Visit: Payer: Self-pay

## 2019-06-07 ENCOUNTER — Encounter (HOSPITAL_COMMUNITY): Payer: Self-pay

## 2019-06-07 ENCOUNTER — Emergency Department (HOSPITAL_COMMUNITY): Payer: Medicaid Other

## 2019-06-07 ENCOUNTER — Emergency Department (HOSPITAL_COMMUNITY)
Admission: EM | Admit: 2019-06-07 | Discharge: 2019-06-08 | Disposition: A | Discharge status: Home / Self Care | Payer: Medicaid Other | Attending: Emergency Medicine | Admitting: Emergency Medicine

## 2019-06-07 DIAGNOSIS — R110 Nausea: Secondary | ICD-10-CM | POA: Diagnosis not present

## 2019-06-07 DIAGNOSIS — Z87891 Personal history of nicotine dependence: Secondary | ICD-10-CM | POA: Insufficient documentation

## 2019-06-07 DIAGNOSIS — R1030 Lower abdominal pain, unspecified: Secondary | ICD-10-CM | POA: Diagnosis present

## 2019-06-07 DIAGNOSIS — D259 Leiomyoma of uterus, unspecified: Secondary | ICD-10-CM | POA: Diagnosis not present

## 2019-06-07 HISTORY — DX: Unspecified convulsions: R56.9

## 2019-06-07 HISTORY — DX: Post-traumatic stress disorder, unspecified: F43.10

## 2019-06-07 LAB — CBC
HCT: 41.6 % (ref 36.0–46.0)
Hemoglobin: 13.5 g/dL (ref 12.0–15.0)
MCH: 29.9 pg (ref 26.0–34.0)
MCHC: 32.5 g/dL (ref 30.0–36.0)
MCV: 92 fL (ref 80.0–100.0)
Platelets: 244 10*3/uL (ref 150–400)
RBC: 4.52 MIL/uL (ref 3.87–5.11)
RDW: 12.9 % (ref 11.5–15.5)
WBC: 11.8 10*3/uL — ABNORMAL HIGH (ref 4.0–10.5)
nRBC: 0 % (ref 0.0–0.2)

## 2019-06-07 LAB — URINALYSIS, ROUTINE W REFLEX MICROSCOPIC
Bacteria, UA: NONE SEEN
Bilirubin Urine: NEGATIVE
Glucose, UA: NEGATIVE mg/dL
Hgb urine dipstick: NEGATIVE
Ketones, ur: NEGATIVE mg/dL
Leukocytes,Ua: NEGATIVE
Nitrite: NEGATIVE
Protein, ur: 30 mg/dL — AB
Specific Gravity, Urine: 1.03 (ref 1.005–1.030)
pH: 5 (ref 5.0–8.0)

## 2019-06-07 LAB — LIPASE, BLOOD: Lipase: 45 U/L (ref 11–51)

## 2019-06-07 LAB — COMPREHENSIVE METABOLIC PANEL
ALT: 15 U/L (ref 0–44)
AST: 19 U/L (ref 15–41)
Albumin: 4.2 g/dL (ref 3.5–5.0)
Alkaline Phosphatase: 64 U/L (ref 38–126)
Anion gap: 9 (ref 5–15)
BUN: 11 mg/dL (ref 6–20)
CO2: 23 mmol/L (ref 22–32)
Calcium: 8.9 mg/dL (ref 8.9–10.3)
Chloride: 107 mmol/L (ref 98–111)
Creatinine, Ser: 0.65 mg/dL (ref 0.44–1.00)
GFR calc Af Amer: >60 mL/min (ref >60)
GFR calc non Af Amer: >60 mL/min (ref >60)
Glucose, Bld: 125 mg/dL — ABNORMAL HIGH (ref 70–99)
Potassium: 3.5 mmol/L (ref 3.5–5.1)
Sodium: 139 mmol/L (ref 135–145)
Total Bilirubin: 0.9 mg/dL (ref 0.3–1.2)
Total Protein: 7.1 g/dL (ref 6.5–8.1)

## 2019-06-07 LAB — POC URINE PREG, ED: Preg Test, Ur: NEGATIVE

## 2019-06-07 MED ORDER — ONDANSETRON HCL 4 MG/2ML IJ SOLN
4.0000 mg | Freq: Once | INTRAMUSCULAR | Status: AC
  Administered 2019-06-07: 4 mg via INTRAVENOUS
  Filled 2019-06-07: qty 2

## 2019-06-07 MED ORDER — SODIUM CHLORIDE 0.9 % IV BOLUS
1000.0000 mL | Freq: Once | INTRAVENOUS | Status: AC
  Administered 2019-06-07: 1000 mL via INTRAVENOUS

## 2019-06-07 MED ORDER — MORPHINE SULFATE (PF) 2 MG/ML IV SOLN
2.0000 mg | Freq: Once | INTRAVENOUS | Status: AC
  Administered 2019-06-07: 2 mg via INTRAVENOUS
  Filled 2019-06-07: qty 1

## 2019-06-07 MED ORDER — IOHEXOL 300 MG/ML  SOLN
100.0000 mL | Freq: Once | INTRAMUSCULAR | Status: AC | PRN
  Administered 2019-06-07: 23:00:00 100 mL via INTRAVENOUS

## 2019-06-07 NOTE — ED Provider Notes (Signed)
[facility] EMERGENCY DEPARTMENT Provider Note   CSN: [id number] Arrival date & time: 06/07/19  2012     History   Chief Complaint Chief Complaint  Patient presents with   Abdominal Pain    RLQ    HPI Kelsey Walton is a 38 y.o. female.     HPI   Kelsey Walton is a 38 y.o. female who presents to the Emergency Department complaining of lower abdominal pain that began this morning at 4-5 am.  She describes the pain as a sharp pain "that made me double over."  Pain has been associated with nausea only and seemed to worsen with food intake.  This afternoon, the pain has radiated slightly to the right lower abdomen and around into her back.  She notes pain became worse after attempting to have intercourse which she states has been painful for her for some time.  She also reports having some urinary frequency, but denies hematuria, flank pain, and burning with urination.  She is currently waiting for evaluation for possible endometriosis.  She denies pelvic pain, abnormal vaginal bleeding discharge.  No fever, chills, or vomiting.  Last meal was 7pm, ate one half a sandwich       Past Medical History:  Diagnosis Date   Anxiety    PTSD (post-traumatic stress disorder)    Seizures ([place])     Patient Active Problem List   Diagnosis Date Noted   Breast mass, right 07/17/2015    Past Surgical History:  Procedure Laterality Date   AUGMENTATION MAMMAPLASTY Bilateral 2008   saline   TUBAL LIGATION       OB History    Gravida  3   Para  2   Term      Preterm      AB  1   Living  2     SAB      TAB      Ectopic      Multiple      Live Births           Obstetric Comments  Menstrual age:   Age 1st Pregnancy: 2          Home Medications    Prior to Admission medications   Medication Sig Start Date End Date Taking? Authorizing Provider  ALPRAZolam Duanne Moron) 1 MG tablet Take 1 mg by mouth 2 (two) times daily.     [provider]    cyclobenzaprine (FLEXERIL) 5 MG tablet Take 1 tablet (5 mg total) by mouth 3 (three) times daily as needed for muscle spasms. 04/03/18   Evalee Jefferson, PA-C  doxycycline (VIBRAMYCIN) 100 MG capsule Take 1 capsule (100 mg total) by mouth 2 (two) times daily. 02/18/17   Davonna Belling, MD  HYDROcodone-acetaminophen (NORCO/VICODIN) 5-325 MG tablet Take 1 tablet by mouth every 4 (four) hours as needed. 04/03/18   Evalee Jefferson, PA-C  ibuprofen (ADVIL,MOTRIN) 600 MG tablet Take 1 tablet (600 mg total) by mouth every 6 (six) hours as needed. 04/03/18   Evalee Jefferson, PA-C  ondansetron (ZOFRAN ODT) 4 MG disintegrating tablet Take 1 tablet (4 mg total) by mouth every 8 (eight) hours as needed for nausea or vomiting. 04/03/18   Evalee Jefferson, PA-C    Family History Family History  Problem Relation Age of Onset   Breast cancer Maternal Aunt        great aunt   Leukemia Maternal Grandfather    Prostate cancer Maternal Grandfather    Multiple myeloma Father  lung, neck, bone    Social History Social History   Tobacco Use   Smoking status: Former Smoker    Quit date: 12/15/2012    Years since quitting: 6.4   Smokeless tobacco: Never Used  Substance Use Topics   Alcohol use: Yes    Alcohol/week: 0.0 standard drinks    Comment: occ    Drug use: Yes    Types: Marijuana    Comment: last used 1 month ago as of 02/02/19     Allergies   Patient has no known allergies.   Review of Systems Review of Systems  Constitutional: Negative for appetite change, chills and fever.  Respiratory: Negative for shortness of breath.   Cardiovascular: Negative for chest pain.  Gastrointestinal: Positive for abdominal pain and nausea. Negative for blood in stool, constipation, diarrhea and vomiting.  Genitourinary: Positive for frequency. Negative for decreased urine volume, difficulty urinating, dysuria, flank pain, pelvic pain, vaginal bleeding, vaginal discharge and vaginal pain.  Musculoskeletal:  Positive for back pain. Negative for neck pain.  Skin: Negative for color change and rash.  Neurological: Negative for dizziness, weakness and numbness.  Hematological: Negative for adenopathy.     Physical Exam Updated Vital Signs BP (!) 126/96 (BP Location: Right Arm)    Pulse (!) 105    Temp (S) 98.4 F (36.9 C) (Oral) Comment: Ibuprofen @ 4:30 pm today   Resp 18    Ht 5' (1.524 m)    Wt 56.2 kg    LMP 05/26/2019    SpO2 98%    BMI 24.22 kg/m   Physical Exam Vitals signs and nursing note reviewed.  Constitutional:      General: She is not in acute distress.    Appearance: She is well-developed. She is not ill-appearing or toxic-appearing.  HENT:     Mouth/Throat:     Mouth: Mucous membranes are moist.  Neck:     Musculoskeletal: Normal range of motion.  Cardiovascular:     Rate and Rhythm: Normal rate and regular rhythm.     Pulses: Normal pulses.  Pulmonary:     Effort: Pulmonary effort is normal.     Breath sounds: Normal breath sounds.  Chest:     Chest wall: No tenderness.  Abdominal:     Palpations: Abdomen is soft. There is no mass.     Tenderness: There is abdominal tenderness in the right lower quadrant. There is no right CVA tenderness or left CVA tenderness.     Comments: periumbilical and RLQ tenderness on exam.  No rebound or guarding.  Abdomen is soft.    Genitourinary:    Labia:        Right: No rash.        Left: No rash.      Vagina: No foreign body. No vaginal discharge.     Cervix: Cervical motion tenderness present. No discharge, friability or cervical bleeding.     Uterus: Enlarged and tender.      Adnexa:        Right: Tenderness present. No mass.         Left: Tenderness present. No mass.       Comments: Exam assisted by nursing.  CMT and bilateral adnexal tenderness w/o palpable masses.  No significant vaginal discharge.  No vaginal bleeding.   Musculoskeletal: Normal range of motion.  Skin:    General: Skin is warm.  Neurological:      General: No focal deficit present.     Mental Status:  She is alert.     Sensory: No sensory deficit.     Motor: No weakness.      ED Treatments / Results  Labs (all labs ordered are listed, but only abnormal results are displayed) Labs Reviewed  COMPREHENSIVE METABOLIC PANEL - Abnormal; Notable for the following components:      Result Value   Glucose, Bld 125 (*)    All other components within normal limits  CBC - Abnormal; Notable for the following components:   WBC 11.8 (*)    All other components within normal limits  LIPASE, BLOOD  URINALYSIS, ROUTINE W REFLEX MICROSCOPIC  POC URINE PREG, ED    EKG None  Radiology Ct Abdomen Pelvis W Contrast  Result Date: 06/07/2019 CLINICAL DATA:  Right lower quadrant pain. EXAM: CT ABDOMEN AND PELVIS WITH CONTRAST TECHNIQUE: Multidetector CT imaging of the abdomen and pelvis was performed using the standard protocol following bolus administration of intravenous contrast. CONTRAST:  134m OMNIPAQUE IOHEXOL 300 MG/ML  SOLN COMPARISON:  None. FINDINGS: Lower chest: Lung bases are clear. No effusions. Heart is normal size. Hepatobiliary: No focal hepatic abnormality. Gallbladder unremarkable. Pancreas: No focal abnormality or ductal dilatation. Spleen: No focal abnormality.  Normal size. Adrenals/Urinary Tract: No adrenal abnormality. No focal renal abnormality. No stones or hydronephrosis. Urinary bladder is unremarkable. Stomach/Bowel: Normal appendix. Stomach, large and small bowel grossly unremarkable. Vascular/Lymphatic: Scattered calcifications in the aorta. No evidence of aneurysm or adenopathy. Reproductive: Multiple uterine fibroids. Bilateral ovarian follicles. 2.5 cm cyst or dominant follicle in the left ovary. Other: Trace free fluid in the pelvis.  No free air. Musculoskeletal: No acute bony abnormality. IMPRESSION: Fibroid uterus. Normal appendix. No acute findings in the abdomen or pelvis. Electronically Signed   By: KRolm BaptiseM.D.    On: 06/07/2019 23:49    Procedures Procedures (including critical care time)  Medications Ordered in ED Medications  sodium chloride 0.9 % bolus 1,000 mL (has no administration in time range)  ondansetron (ZOFRAN) injection 4 mg (has no administration in time range)  morphine 2 MG/ML injection 2 mg (has no administration in time range)     Initial Impression / Assessment and Plan / ED Course  I have reviewed the triage vital signs and the nursing notes.  Pertinent labs & imaging results that were available during my care of the patient were reviewed by me and considered in my medical decision making (see chart for details).        Pt with > 12 hrs hx of right lower abdominal pain and nausea.  No fever or vomiting. Will obtain labs and CT abd/pelvis.  Exam concerning for appendectomy.    On recheck, pt reports feeling better.  CT abd/pelvis shows uterine fibroids, nml appendix.  Labs reassuring.  I suppose the evidence of uterine fibroids and hx of painful intercourse may be source of pt's sx's.  [facility]  She appears appropriate for d/c home.    Final Clinical Impressions(s) / ED Diagnoses   Final diagnoses:  Uterine leiomyoma, unspecified location      ED Discharge Orders    None       TBufford Lope07/15/20 2242    ZMilton Ferguson MD 06/10/19 [phone]

## 2019-06-07 NOTE — ED Triage Notes (Signed)
Pt reports RLQ pain that wraps around to back, pt has tests pending for endometriosis.  Pt denies urinary symptoms. Pt pain started this morning, some mild back pain yesterday. Last BM this morning and normal per report.

## 2019-06-08 LAB — WET PREP, GENITAL
Clue Cells Wet Prep HPF POC: NONE SEEN
Sperm: NONE SEEN
Trich, Wet Prep: NONE SEEN
Yeast Wet Prep HPF POC: NONE SEEN

## 2019-06-08 MED ORDER — HYDROCODONE-ACETAMINOPHEN 5-325 MG PO TABS: ORAL_TABLET | ORAL | 0 refills | Status: DC

## 2019-06-08 MED ORDER — IBUPROFEN 400 MG PO TABS: 400.0000 mg | ORAL_TABLET | Freq: Four times a day (QID) | ORAL | 0 refills | Status: DC | PRN

## 2019-06-08 NOTE — Discharge Instructions (Signed)
Call [facility] at the number listed to arrange a follow-up appt.

## 2019-06-09 ENCOUNTER — Telehealth: Payer: Self-pay | Admitting: Obstetrics & Gynecology

## 2019-06-09 LAB — GC/CHLAMYDIA PROBE AMP (~~LOC~~) NOT AT ARMC
Chlamydia: NEGATIVE
Neisseria Gonorrhea: NEGATIVE

## 2019-06-09 LAB — URINE CULTURE: Culture: NO GROWTH

## 2019-06-09 NOTE — Telephone Encounter (Signed)

## 2019-06-10 ENCOUNTER — Other Ambulatory Visit: Payer: Self-pay

## 2019-06-10 ENCOUNTER — Encounter: Payer: Self-pay | Admitting: Obstetrics & Gynecology

## 2019-06-10 ENCOUNTER — Ambulatory Visit (INDEPENDENT_AMBULATORY_CARE_PROVIDER_SITE_OTHER): Payer: Medicaid Other | Admitting: Obstetrics & Gynecology

## 2019-06-10 VITALS — BP 98/65 | HR 72 | Ht 60.0 in | Wt 122.4 lb

## 2019-06-10 DIAGNOSIS — N920 Excessive and frequent menstruation with regular cycle: Secondary | ICD-10-CM | POA: Diagnosis not present

## 2019-06-10 DIAGNOSIS — N946 Dysmenorrhea, unspecified: Secondary | ICD-10-CM

## 2019-06-10 DIAGNOSIS — D219 Benign neoplasm of connective and other soft tissue, unspecified: Secondary | ICD-10-CM

## 2019-06-10 DIAGNOSIS — N941 Unspecified dyspareunia: Secondary | ICD-10-CM

## 2019-06-10 MED ORDER — KETOROLAC TROMETHAMINE 10 MG PO TABS: 10.0000 mg | ORAL_TABLET | Freq: Three times a day (TID) | ORAL | 0 refills | Status: DC | PRN

## 2019-06-10 MED ORDER — ONDANSETRON 8 MG PO TBDP: 8.0000 mg | ORAL_TABLET | Freq: Three times a day (TID) | ORAL | 0 refills | Status: DC | PRN

## 2019-06-10 NOTE — Progress Notes (Signed)
Chief Complaint  Patient presents with  . ER follow-up    lower abdominal pain/ had CT Scan      38 y.o. [id number] Patient's last menstrual period was 05/26/2019. The current method of family planning is tubal ligation.  Outpatient Encounter Medications as of 06/10/2019  Medication Sig  . ALPRAZolam (XANAX) 1 MG tablet Take 1 mg by mouth 2 (two) times daily.   Marland Kitchen HYDROcodone-acetaminophen (NORCO/VICODIN) 5-325 MG tablet Take one tab po q 4 hrs prn pain  . ibuprofen (ADVIL) 400 MG tablet Take 1 tablet (400 mg total) by mouth every 6 (six) hours as needed.  Marland Kitchen imipramine (TOFRANIL) 50 MG tablet Take 50 mg by mouth at bedtime.  . cyclobenzaprine (FLEXERIL) 5 MG tablet Take 1 tablet (5 mg total) by mouth 3 (three) times daily as needed for muscle spasms. (Patient not taking: Reported on 06/10/2019)  . ketorolac (TORADOL) 10 MG tablet Take 1 tablet (10 mg total) by mouth every 8 (eight) hours as needed.  . ondansetron (ZOFRAN ODT) 4 MG disintegrating tablet Take 1 tablet (4 mg total) by mouth every 8 (eight) hours as needed for nausea or vomiting. (Patient not taking: Reported on 06/10/2019)  . ondansetron (ZOFRAN ODT) 8 MG disintegrating tablet Take 1 tablet (8 mg total) by mouth every 8 (eight) hours as needed for nausea or vomiting.   No facility-administered encounter medications on file as of 06/10/2019.     Subjective Pt is seen for follow up from ED CT reveals a 12-14 week size fibroid uterus She had acute onset of right sided abdominal pain last week She has had years of heavy painful periods Also has had increasing pain with sex to the point now that she cnnot have sex S/p tubal ligation  Past Medical History:  Diagnosis Date  . Anxiety   . PTSD (post-traumatic stress disorder)   . Seizures ([place])     Past Surgical History:  Procedure Laterality Date  . AUGMENTATION MAMMAPLASTY Bilateral 2008   saline  . TUBAL LIGATION      OB History    Gravida  3   Para  2   Term      Preterm      AB  1   Living  2     SAB      TAB      Ectopic      Multiple      Live Births           Obstetric Comments  Menstrual age:   Age 1st Pregnancy: 53         No Known Allergies  Social History   Socioeconomic History  . Marital status: Divorced    Spouse name: Not on file  . Number of children: 2  . Years of education: Not on file  . Highest education level: Not on file  Occupational History  . Not on file  Social Needs  . Financial resource strain: Not on file  . Food insecurity    Worry: Not on file    Inability: Not on file  . Transportation needs    Medical: Not on file    Non-medical: Not on file  Tobacco Use  . Smoking status: Former Smoker    Quit date: 12/15/2012    Years since quitting: 6.4  . Smokeless tobacco: Never Used  Substance and Sexual Activity  . Alcohol use: Yes    Alcohol/week: 0.0 standard drinks    Comment: occ   .  Drug use: Yes    Types: Marijuana    Comment: last used 1 month ago as of 02/02/19  . Sexual activity: Yes    Birth control/protection: Surgical  Lifestyle  . Physical activity    Days per week: Not on file    Minutes per session: Not on file  . Stress: Not on file  Relationships  . Social Herbalist on phone: Not on file    Gets together: Not on file    Attends religious service: Not on file    Active member of club or organization: Not on file    Attends meetings of clubs or organizations: Not on file    Relationship status: Not on file  Other Topics Concern  . Not on file  Social History Narrative  . Not on file    Family History  Problem Relation Age of Onset  . Breast cancer Maternal Aunt        great aunt  . Leukemia Maternal Grandfather   . Prostate cancer Maternal Grandfather   . Multiple myeloma Father        lung, neck, bone    Medications:       Current Outpatient Medications:  .  ALPRAZolam (XANAX) 1 MG tablet, Take 1 mg by mouth 2 (two) times  daily. , Disp: , Rfl:  .  HYDROcodone-acetaminophen (NORCO/VICODIN) 5-325 MG tablet, Take one tab po q 4 hrs prn pain, Disp: 8 tablet, Rfl: 0 .  ibuprofen (ADVIL) 400 MG tablet, Take 1 tablet (400 mg total) by mouth every 6 (six) hours as needed., Disp: 21 tablet, Rfl: 0 .  imipramine (TOFRANIL) 50 MG tablet, Take 50 mg by mouth at bedtime., Disp: , Rfl:  .  cyclobenzaprine (FLEXERIL) 5 MG tablet, Take 1 tablet (5 mg total) by mouth 3 (three) times daily as needed for muscle spasms. (Patient not taking: Reported on 06/10/2019), Disp: 15 tablet, Rfl: 0 .  ketorolac (TORADOL) 10 MG tablet, Take 1 tablet (10 mg total) by mouth every 8 (eight) hours as needed., Disp: 15 tablet, Rfl: 0 .  ondansetron (ZOFRAN ODT) 4 MG disintegrating tablet, Take 1 tablet (4 mg total) by mouth every 8 (eight) hours as needed for nausea or vomiting. (Patient not taking: Reported on 06/10/2019), Disp: 20 tablet, Rfl: 0 .  ondansetron (ZOFRAN ODT) 8 MG disintegrating tablet, Take 1 tablet (8 mg total) by mouth every 8 (eight) hours as needed for nausea or vomiting., Disp: 20 tablet, Rfl: 0  Objective Blood pressure 98/65, pulse 72, height 5' (1.524 m), weight 122 lb 6.4 oz (55.5 kg), last menstrual period 05/26/2019.  General WDWN female NAD Vulva:  normal appearing vulva with no masses, tenderness or lesions Vagina:  normal mucosa, no discharge Cervix:  Normal no lesions Uterus:  12-14 weeks size with fibroids 2 predominant Adnexa: ovaries:present,  normal adnexa in size, nontender and no masses   Pertinent ROS No burning with urination, frequency or urgency No nausea, vomiting or diarrhea Nor fever chills or other constitutional symptoms   Labs or studies Reviewed all of her labs and scans from recent ED visit    Impression Diagnoses this Encounter::   ICD-10-CM   1. Fibroids  D21.9   2. Dyspareunia, female  N94.10   3. Menorrhagia with regular cycle  N92.0   4. Dysmenorrhea  N94.6     Established  relevant diagnosis(es):   Plan/Recommendations: Meds ordered this encounter  Medications  . ketorolac (TORADOL) 10 MG tablet  Sig: Take 1 tablet (10 mg total) by mouth every 8 (eight) hours as needed.    Dispense:  15 tablet    Refill:  0  . ondansetron (ZOFRAN ODT) 8 MG disintegrating tablet    Sig: Take 1 tablet (8 mg total) by mouth every 8 (eight) hours as needed for nausea or vomiting.    Dispense:  20 tablet    Refill:  0    Labs or Scans Ordered: No orders of the defined types were placed in this encounter.   Management:: >schedule TAH thru mini lap incision with removal of tubes 06/30/2019  Follow up Return in about 4 weeks (around 07/08/2019) for [place], with Dr Elonda Husky.        All questions were answered.

## 2019-06-25 NOTE — Patient Instructions (Signed)
Latera Mclin Pasley  06/25/2019     @PREFPERIOPPHARMACY @   Your procedure is scheduled on  06/30/2019  Report to [facility] at  [email or web address].  Call this number if you have problems the morning of surgery:  [phone]   Remember:  Do not eat or drink after midnight.                        Take these medicines the morning of surgery with A SIP OF WATER  Xanax, imipramine.    Do not wear jewelry, make-up or nail polish.  Do not wear lotions, powders, or perfumes, or deodorant.  Do not shave 48 hours prior to surgery.  Men may shave face and neck.  Do not bring valuables to the hospital.  [facility] is not responsible for any belongings or valuables.  Contacts, dentures or bridgework may not be worn into surgery.  Leave your suitcase in the car.  After surgery it may be brought to your room.  For patients admitted to the hospital, discharge time will be determined by your treatment team.  Patients discharged the day of surgery will not be allowed to drive home.   Name and phone number of your driver:   family Special instructions:  None  Please read over the following fact sheets that you were given. Anesthesia Post-op Instructions and Care and Recovery After Surgery       Abdominal Hysterectomy, Care After This sheet gives you information about how to care for yourself after your procedure. Your health care provider may also give you more specific instructions. If you have problems or questions, contact your health care provider. What can I expect after the procedure? After the procedure, it is common to have:  Pain.  Tiredness (fatigue).  Poor appetite.  Lowered interest in sex.  Bleeding and discharge from your vagina. You may need to use a pad in your underwear after this procedure. Follow these instructions at home: Medicines  Take over-the-counter and prescription medicines only as told by your doctor.  Do not take aspirin or ibuprofen. These  medicines can cause bleeding.  Ask your doctor if the medicine prescribed to you: ? Requires you to avoid driving or using heavy machinery. ? Can cause trouble pooping (constipation). You may need to take these actions to prevent or treat trouble pooping:  Take over-the-counter or prescription medicines.  Eat foods that are high in fiber. These include beans, whole grains, and fresh fruits and vegetables.  Limit foods that are high in fat and processed sugars. These include fried or sweet foods. Surgical cut (incision) care      Follow instructions from your doctor about how to take care of your cut from surgery (incision). Make sure you: ? Wash your hands with soap and water before and after you change your bandage (dressing). If you cannot use soap and water, use hand sanitizer. ? Change your bandage as told by your doctor. ? Leave stitches (sutures), skin glue, or skin tape (adhesive) strips in place. They may need to stay in place for 2 weeks or longer. If tape strips get loose and curl up, you may trim the loose edges. Do not remove tape strips completely unless your doctor says it is okay.  Check your cut from surgery every day for signs of infection. Check for: ? Redness, swelling, or pain. ? Fluid or blood. ? Warmth. ? Pus  or a bad smell. Activity  Rest as told by your doctor. ? Do not sit for a long time without moving. Get up to take short walks every 1-2 hours. This is important. Ask for help if you feel weak or unsteady.  Do not lift anything that is heavier than 10 lb (4.5 kg), or the limit that you are told, until your doctor says that it is safe.  Do not drive or use heavy machinery while taking prescription pain medicine.  Follow your doctor's advice about exercise, driving, and general activities. Return to your normal activities as told by your doctor. Ask your doctor what activities are safe for you. Lifestyle  Do not douche, use tampons, or have sex for at  least 6 weeks or as told by your doctor.  Do not drink alcohol until your doctor says it is okay.  Do not use any products that contain nicotine or tobacco, such as cigarettes, e-cigarettes, and chewing tobacco. If you need help quitting, ask your doctor. General instructions   Drink enough fluid to keep your pee (urine) pale yellow.  Do not take baths, swim, or use a hot tub until your doctor approves. Ask your doctor if you may take showers. You may only be allowed to take sponge baths.  Try to have someone at home with you for the first 1-2 weeks to help you with your daily chores at home.  Keep the bandage dry until your doctor says it can be taken off.  Wear tight-fitting (compression) stockings as told by your health care provider. These stockings help to prevent blood clots and reduce swelling in your legs.  Keep all follow-up visits as told by your doctor. This is important. Contact a doctor if:  You have any of these signs of infection: ? Redness, swelling, or pain around your cut. ? Fluid or blood coming from your cut. ? Warmth coming from your cut. ? Pus or a bad smell coming from your cut. ? Chills or a fever.  Your cut breaks open.  You feel dizzy or light-headed.  You have pain or bleeding when you pee.  You keep having watery poop (diarrhea).  You keep feeling like you may vomit (nauseous) or keep vomiting.  You have unusual fluid (discharge) coming from your vagina.  You have a rash.  You have any type of reaction to your medicine that is not normal, or you develop an allergy to your medicine.  Your pain medicine does not help. Get help right away if:  You have a fever and your symptoms get worse all of a sudden.  You have very bad pain in your belly (abdomen).  You are short of breath.  You faint.  You have pain, swelling, or redness of your leg.  You bleed a lot from your vagina and notice clumps of blood (clots). Summary  Do not take  baths, swim, or use a hot tub until your doctor approves. Ask your doctor if you may take showers. You may only be allowed to take sponge baths.  Do not lift anything that is heavier than 10 lb (4.5 kg), or the limit that you are told, until your doctor says that it is safe.  Follow your doctor's advice about exercise, driving, and general activities. Ask your doctor what activities are safe for you.  Try to have someone at home with you for the first 1-2 weeks to help you with your daily chores at home. This information is not intended  to replace advice given to you by your health care provider. Make sure you discuss any questions you have with your health care provider. Document Released: 08/20/2008 Document Revised: 01/14/2019 Document Reviewed: 10/30/2016 Elsevier Patient Education  2020 [place] Anesthesia, Adult, Care After This sheet gives you information about how to care for yourself after your procedure. Your health care provider may also give you more specific instructions. If you have problems or questions, contact your health care provider. What can I expect after the procedure? After the procedure, the following side effects are common:  Pain or discomfort at the IV site.  Nausea.  Vomiting.  Sore throat.  Trouble concentrating.  Feeling cold or chills.  Weak or tired.  Sleepiness and fatigue.  Soreness and body aches. These side effects can affect parts of the body that were not involved in surgery. Follow these instructions at home:  For at least 24 hours after the procedure:  Have a responsible adult stay with you. It is important to have someone help care for you until you are awake and alert.  Rest as needed.  Do not: ? Participate in activities in which you could fall or become injured. ? Drive. ? Use heavy machinery. ? Drink alcohol. ? Take sleeping pills or medicines that cause drowsiness. ? Make important decisions or sign legal  documents. ? Take care of children on your own. Eating and drinking  Follow any instructions from your health care provider about eating or drinking restrictions.  When you feel hungry, start by eating small amounts of foods that are soft and easy to digest (bland), such as toast. Gradually return to your regular diet.  Drink enough fluid to keep your urine pale yellow.  If you vomit, rehydrate by drinking water, juice, or clear broth. General instructions  If you have sleep apnea, surgery and certain medicines can increase your risk for breathing problems. Follow instructions from your health care provider about wearing your sleep device: ? Anytime you are sleeping, including during daytime naps. ? While taking prescription pain medicines, sleeping medicines, or medicines that make you drowsy.  Return to your normal activities as told by your health care provider. Ask your health care provider what activities are safe for you.  Take over-the-counter and prescription medicines only as told by your health care provider.  If you smoke, do not smoke without supervision.  Keep all follow-up visits as told by your health care provider. This is important. Contact a health care provider if:  You have nausea or vomiting that does not get better with medicine.  You cannot eat or drink without vomiting.  You have pain that does not get better with medicine.  You are unable to pass urine.  You develop a skin rash.  You have a fever.  You have redness around your IV site that gets worse. Get help right away if:  You have difficulty breathing.  You have chest pain.  You have blood in your urine or stool, or you vomit blood. Summary  After the procedure, it is common to have a sore throat or nausea. It is also common to feel tired.  Have a responsible adult stay with you for the first 24 hours after general anesthesia. It is important to have someone help care for you until you  are awake and alert.  When you feel hungry, start by eating small amounts of foods that are soft and easy to digest (bland), such as toast. Gradually return  to your regular diet.  Drink enough fluid to keep your urine pale yellow.  Return to your normal activities as told by your health care provider. Ask your health care provider what activities are safe for you. This information is not intended to replace advice given to you by your health care provider. Make sure you discuss any questions you have with your health care provider. Document Released: 02/17/2001 Document Revised: 11/14/2017 Document Reviewed: 06/27/2017 Elsevier Patient Education  2020 [employer]. How to Use Chlorhexidine for Bathing Chlorhexidine gluconate (CHG) is a germ-killing (antiseptic) solution that is used to clean the skin. It can get rid of the bacteria that normally live on the skin and can keep them away for about 24 hours. To clean your skin with CHG, you may be given:  A CHG solution to use in the shower or as part of a sponge bath.  A prepackaged cloth that contains CHG. Cleaning your skin with CHG may help lower the risk for infection:  While you are staying in the intensive care unit of the hospital.  If you have a vascular access, such as a central line, to provide short-term or long-term access to your veins.  If you have a catheter to drain urine from your bladder.  If you are on a ventilator. A ventilator is a machine that helps you breathe by moving air in and out of your lungs.  After surgery. What are the risks? Risks of using CHG include:  A skin reaction.  Hearing loss, if CHG gets in your ears.  Eye injury, if CHG gets in your eyes and is not rinsed out.  The CHG product catching fire. Make sure that you avoid smoking and flames after applying CHG to your skin. Do not use CHG:  If you have a chlorhexidine allergy or have previously reacted to chlorhexidine.  On babies younger  than 40 months of age. How to use CHG solution  Use CHG only as told by your health care provider, and follow the instructions on the label.  Use the full amount of CHG as directed. Usually, this is one bottle. During a shower Follow these steps when using CHG solution during a shower (unless your health care provider gives you different instructions): 1. Start the shower. 2. Use your normal soap and shampoo to wash your face and hair. 3. Turn off the shower or move out of the shower stream. 4. Pour the CHG onto a clean washcloth. Do not use any type of brush or rough-edged sponge. 5. Starting at your neck, lather your body down to your toes. Make sure you follow these instructions: ? If you will be having surgery, pay special attention to the part of your body where you will be having surgery. Scrub this area for at least 1 minute. ? Do not use CHG on your head or face. If the solution gets into your ears or eyes, rinse them well with water. ? Avoid your genital area. ? Avoid any areas of skin that have broken skin, cuts, or scrapes. ? Scrub your back and under your arms. Make sure to wash skin folds. 6. Let the lather sit on your skin for 1-2 minutes or as long as told by your health care provider. 7. Thoroughly rinse your entire body in the shower. Make sure that all body creases and crevices are rinsed well. 8. Dry off with a clean towel. Do not put any substances on your body afterward-such as powder, lotion, or perfume-unless  you are told to do so by your health care provider. Only use lotions that are recommended by the manufacturer. 9. Put on clean clothes or pajamas. 10. If it is the night before your surgery, sleep in clean sheets.  During a sponge bath Follow these steps when using CHG solution during a sponge bath (unless your health care provider gives you different instructions): 1. Use your normal soap and shampoo to wash your face and hair. 2. Pour the CHG onto a clean  washcloth. 3. Starting at your neck, lather your body down to your toes. Make sure you follow these instructions: ? If you will be having surgery, pay special attention to the part of your body where you will be having surgery. Scrub this area for at least 1 minute. ? Do not use CHG on your head or face. If the solution gets into your ears or eyes, rinse them well with water. ? Avoid your genital area. ? Avoid any areas of skin that have broken skin, cuts, or scrapes. ? Scrub your back and under your arms. Make sure to wash skin folds. 4. Let the lather sit on your skin for 1-2 minutes or as long as told by your health care provider. 5. Using a different clean, wet washcloth, thoroughly rinse your entire body. Make sure that all body creases and crevices are rinsed well. 6. Dry off with a clean towel. Do not put any substances on your body afterward-such as powder, lotion, or perfume-unless you are told to do so by your health care provider. Only use lotions that are recommended by the manufacturer. 7. Put on clean clothes or pajamas. 8. If it is the night before your surgery, sleep in clean sheets. How to use CHG prepackaged cloths  Only use CHG cloths as told by your health care provider, and follow the instructions on the label.  Use the CHG cloth on clean, dry skin.  Do not use the CHG cloth on your head or face unless your health care provider tells you to.  When washing with the CHG cloth: ? Avoid your genital area. ? Avoid any areas of skin that have broken skin, cuts, or scrapes. Before surgery Follow these steps when using a CHG cloth to clean before surgery (unless your health care provider gives you different instructions): 1. Using the CHG cloth, vigorously scrub the part of your body where you will be having surgery. Scrub using a back-and-forth motion for 3 minutes. The area on your body should be completely wet with CHG when you are done scrubbing. 2. Do not rinse. Discard  the cloth and let the area air-dry. Do not put any substances on the area afterward, such as powder, lotion, or perfume. 3. Put on clean clothes or pajamas. 4. If it is the night before your surgery, sleep in clean sheets.  For general bathing Follow these steps when using CHG cloths for general bathing (unless your health care provider gives you different instructions). 1. Use a separate CHG cloth for each area of your body. Make sure you wash between any folds of skin and between your fingers and toes. Wash your body in the following order, switching to a new cloth after each step: ? The front of your neck, shoulders, and chest. ? Both of your arms, under your arms, and your hands. ? Your stomach and groin area, avoiding the genitals. ? Your right leg and foot. ? Your left leg and foot. ? The back of your neck,  your back, and your buttocks. 2. Do not rinse. Discard the cloth and let the area air-dry. Do not put any substances on your body afterward-such as powder, lotion, or perfume-unless you are told to do so by your health care provider. Only use lotions that are recommended by the manufacturer. 3. Put on clean clothes or pajamas. Contact a health care provider if:  Your skin gets irritated after scrubbing.  You have questions about using your solution or cloth. Get help right away if:  Your eyes become very red or swollen.  Your eyes itch badly.  Your skin itches badly and is red or swollen.  Your hearing changes.  You have trouble seeing.  You have swelling or tingling in your mouth or throat.  You have trouble breathing.  You swallow any chlorhexidine. Summary  Chlorhexidine gluconate (CHG) is a germ-killing (antiseptic) solution that is used to clean the skin. Cleaning your skin with CHG may help to lower your risk for infection.  You may be given CHG to use for bathing. It may be in a bottle or in a prepackaged cloth to use on your skin. Carefully follow your health  care provider's instructions and the instructions on the product label.  Do not use CHG if you have a chlorhexidine allergy.  Contact your health care provider if your skin gets irritated after scrubbing. This information is not intended to replace advice given to you by your health care provider. Make sure you discuss any questions you have with your health care provider. Document Released: 08/05/2012 Document Revised: 01/28/2019 Document Reviewed: 10/09/2017 Elsevier Patient Education  2020 [employer].

## 2019-06-28 ENCOUNTER — Encounter (HOSPITAL_COMMUNITY)
Admission: RE | Admit: 2019-06-28 | Discharge: 2019-06-28 | Disposition: A | Discharge status: Home / Self Care | Payer: Medicaid Other | Source: Ambulatory Visit | Attending: Obstetrics & Gynecology | Admitting: Obstetrics & Gynecology

## 2019-06-28 ENCOUNTER — Other Ambulatory Visit: Payer: Self-pay

## 2019-06-28 ENCOUNTER — Other Ambulatory Visit (HOSPITAL_COMMUNITY)
Admission: RE | Admit: 2019-06-28 | Discharge: 2019-06-28 | Disposition: A | Discharge status: Home / Self Care | Payer: HRSA Program | Source: Ambulatory Visit | Attending: Obstetrics & Gynecology | Admitting: Obstetrics & Gynecology

## 2019-06-28 ENCOUNTER — Encounter (HOSPITAL_COMMUNITY): Payer: Self-pay

## 2019-06-28 ENCOUNTER — Other Ambulatory Visit: Payer: Self-pay | Admitting: Obstetrics & Gynecology

## 2019-06-28 DIAGNOSIS — Z20828 Contact with and (suspected) exposure to other viral communicable diseases: Secondary | ICD-10-CM | POA: Diagnosis present

## 2019-06-28 DIAGNOSIS — Z01812 Encounter for preprocedural laboratory examination: Secondary | ICD-10-CM | POA: Insufficient documentation

## 2019-06-28 HISTORY — DX: Depression, unspecified: F32.A

## 2019-06-28 HISTORY — DX: Unspecified osteoarthritis, unspecified site: M19.90

## 2019-06-28 LAB — URINALYSIS, ROUTINE W REFLEX MICROSCOPIC
Bilirubin Urine: NEGATIVE
Glucose, UA: NEGATIVE mg/dL
Hgb urine dipstick: NEGATIVE
Ketones, ur: NEGATIVE mg/dL
Leukocytes,Ua: NEGATIVE
Nitrite: NEGATIVE
Protein, ur: NEGATIVE mg/dL
Specific Gravity, Urine: 1.015 (ref 1.005–1.030)
pH: 5 (ref 5.0–8.0)

## 2019-06-28 LAB — COMPREHENSIVE METABOLIC PANEL
ALT: 13 U/L (ref 0–44)
AST: 14 U/L — ABNORMAL LOW (ref 15–41)
Albumin: 4.1 g/dL (ref 3.5–5.0)
Alkaline Phosphatase: 45 U/L (ref 38–126)
Anion gap: 8 (ref 5–15)
BUN: 15 mg/dL (ref 6–20)
CO2: 22 mmol/L (ref 22–32)
Calcium: 8.8 mg/dL — ABNORMAL LOW (ref 8.9–10.3)
Chloride: 107 mmol/L (ref 98–111)
Creatinine, Ser: 0.57 mg/dL (ref 0.44–1.00)
GFR calc Af Amer: >60 mL/min (ref >60)
GFR calc non Af Amer: >60 mL/min (ref >60)
Glucose, Bld: 86 mg/dL (ref 70–99)
Potassium: 3.6 mmol/L (ref 3.5–5.1)
Sodium: 137 mmol/L (ref 135–145)
Total Bilirubin: 0.5 mg/dL (ref 0.3–1.2)
Total Protein: 6.9 g/dL (ref 6.5–8.1)

## 2019-06-28 LAB — CBC
HCT: 37.9 % (ref 36.0–46.0)
Hemoglobin: 12.4 g/dL (ref 12.0–15.0)
MCH: 30.1 pg (ref 26.0–34.0)
MCHC: 32.7 g/dL (ref 30.0–36.0)
MCV: 92 fL (ref 80.0–100.0)
Platelets: 253 10*3/uL (ref 150–400)
RBC: 4.12 MIL/uL (ref 3.87–5.11)
RDW: 12.8 % (ref 11.5–15.5)
WBC: 5 10*3/uL (ref 4.0–10.5)
nRBC: 0 % (ref 0.0–0.2)

## 2019-06-28 LAB — TYPE AND SCREEN
ABO/RH(D): A POS
Antibody Screen: NEGATIVE

## 2019-06-28 LAB — RAPID HIV SCREEN (HIV 1/2 AB+AG)
HIV 1/2 Antibodies: NONREACTIVE
HIV-1 P24 Antigen - HIV24: NONREACTIVE

## 2019-06-28 LAB — HCG, QUANTITATIVE, PREGNANCY: hCG, Beta Chain, Quant, S: <1 m[IU]/mL (ref <5)

## 2019-06-28 LAB — SARS CORONAVIRUS 2 (TAT 6-24 HRS): SARS Coronavirus 2: NEGATIVE

## 2019-06-28 NOTE — Progress Notes (Signed)
Patient refuses to have foley catheter placed due to personal reasons on day of surgery.  Dr. Elonda Husky aware.

## 2019-06-30 ENCOUNTER — Encounter (HOSPITAL_COMMUNITY): Payer: Self-pay | Admitting: *Deleted

## 2019-06-30 ENCOUNTER — Encounter (HOSPITAL_COMMUNITY)
Admission: RE | Disposition: A | Discharge status: Home / Self Care | Payer: Self-pay | Source: Home / Self Care | Attending: Obstetrics & Gynecology

## 2019-06-30 ENCOUNTER — Ambulatory Visit (HOSPITAL_COMMUNITY): Payer: Medicaid Other | Admitting: Anesthesiology

## 2019-06-30 ENCOUNTER — Other Ambulatory Visit: Payer: Self-pay

## 2019-06-30 ENCOUNTER — Inpatient Hospital Stay (HOSPITAL_COMMUNITY)
Admission: RE | Admit: 2019-06-30 | Discharge: 2019-07-01 | DRG: 743 | Disposition: A | Discharge status: Home / Self Care | Payer: Medicaid Other | Attending: Obstetrics & Gynecology | Admitting: Obstetrics & Gynecology

## 2019-06-30 DIAGNOSIS — N3289 Other specified disorders of bladder: Secondary | ICD-10-CM | POA: Diagnosis present

## 2019-06-30 DIAGNOSIS — D259 Leiomyoma of uterus, unspecified: Secondary | ICD-10-CM | POA: Diagnosis present

## 2019-06-30 DIAGNOSIS — N888 Other specified noninflammatory disorders of cervix uteri: Secondary | ICD-10-CM | POA: Diagnosis present

## 2019-06-30 DIAGNOSIS — Z791 Long term (current) use of non-steroidal anti-inflammatories (NSAID): Secondary | ICD-10-CM | POA: Diagnosis not present

## 2019-06-30 DIAGNOSIS — N838 Other noninflammatory disorders of ovary, fallopian tube and broad ligament: Secondary | ICD-10-CM | POA: Diagnosis present

## 2019-06-30 DIAGNOSIS — Z90710 Acquired absence of both cervix and uterus: Secondary | ICD-10-CM | POA: Diagnosis present

## 2019-06-30 DIAGNOSIS — N941 Unspecified dyspareunia: Secondary | ICD-10-CM | POA: Diagnosis present

## 2019-06-30 DIAGNOSIS — R1031 Right lower quadrant pain: Secondary | ICD-10-CM

## 2019-06-30 DIAGNOSIS — N841 Polyp of cervix uteri: Secondary | ICD-10-CM | POA: Diagnosis present

## 2019-06-30 DIAGNOSIS — Z79899 Other long term (current) drug therapy: Secondary | ICD-10-CM

## 2019-06-30 DIAGNOSIS — N946 Dysmenorrhea, unspecified: Secondary | ICD-10-CM | POA: Diagnosis present

## 2019-06-30 DIAGNOSIS — N920 Excessive and frequent menstruation with regular cycle: Secondary | ICD-10-CM | POA: Diagnosis present

## 2019-06-30 HISTORY — PX: ABDOMINAL HYSTERECTOMY: SHX81

## 2019-06-30 HISTORY — PX: BILATERAL SALPINGECTOMY: SHX5743

## 2019-06-30 SURGERY — HYSTERECTOMY, ABDOMINAL
Anesthesia: General | Site: Abdomen

## 2019-06-30 MED ORDER — ACETAMINOPHEN 500 MG PO TABS
ORAL_TABLET | ORAL | Status: AC
  Filled 2019-06-30: qty 2

## 2019-06-30 MED ORDER — ACETAMINOPHEN 160 MG/5ML PO SOLN
960.0000 mg | Freq: Once | ORAL | Status: AC
  Filled 2019-06-30: qty 30

## 2019-06-30 MED ORDER — LACTATED RINGERS IV SOLN
Freq: Once | INTRAVENOUS | Status: AC
  Administered 2019-06-30: 12:00:00 via INTRAVENOUS
  Administered 2019-06-30: 10:00:00 1000 mL via INTRAVENOUS

## 2019-06-30 MED ORDER — BUPIVACAINE LIPOSOME 1.3 % IJ SUSP
INTRAMUSCULAR | Status: DC | PRN
  Administered 2019-06-30: 20 mL

## 2019-06-30 MED ORDER — MIDAZOLAM HCL 2 MG/2ML IJ SOLN
INTRAMUSCULAR | Status: AC
  Filled 2019-06-30: qty 2

## 2019-06-30 MED ORDER — CEFAZOLIN SODIUM-DEXTROSE 2-4 GM/100ML-% IV SOLN
INTRAVENOUS | Status: AC
  Filled 2019-06-30: qty 100

## 2019-06-30 MED ORDER — FENTANYL CITRATE (PF) 100 MCG/2ML IJ SOLN
50.0000 ug | INTRAMUSCULAR | Status: DC | PRN
  Administered 2019-06-30 – 2019-07-01 (×2): 50 ug via INTRAVENOUS
  Filled 2019-06-30 (×2): qty 2

## 2019-06-30 MED ORDER — ENOXAPARIN SODIUM 40 MG/0.4ML ~~LOC~~ SOLN
40.0000 mg | SUBCUTANEOUS | Status: DC
  Filled 2019-06-30: qty 0.4

## 2019-06-30 MED ORDER — ACETAMINOPHEN 500 MG PO TABS
1000.0000 mg | ORAL_TABLET | Freq: Once | ORAL | Status: AC
  Administered 2019-06-30: 1000 mg via ORAL

## 2019-06-30 MED ORDER — SUGAMMADEX SODIUM 200 MG/2ML IV SOLN
INTRAVENOUS | Status: DC | PRN
  Administered 2019-06-30 (×2): 100 mg via INTRAVENOUS

## 2019-06-30 MED ORDER — SCOPOLAMINE 1 MG/3DAYS TD PT72
1.0000 | MEDICATED_PATCH | Freq: Once | TRANSDERMAL | Status: DC
  Administered 2019-06-30: 1.5 mg via TRANSDERMAL

## 2019-06-30 MED ORDER — PROMETHAZINE HCL 25 MG/ML IJ SOLN
6.2500 mg | INTRAMUSCULAR | Status: AC | PRN
  Administered 2019-06-30: 12.5 mg via INTRAVENOUS
  Administered 2019-06-30: 6.25 mg via INTRAVENOUS
  Filled 2019-06-30 (×2): qty 1

## 2019-06-30 MED ORDER — IMIPRAMINE HCL 25 MG PO TABS
50.0000 mg | ORAL_TABLET | Freq: Every day | ORAL | Status: DC
  Filled 2019-06-30: qty 2

## 2019-06-30 MED ORDER — ROCURONIUM BROMIDE 10 MG/ML (PF) SYRINGE
PREFILLED_SYRINGE | INTRAVENOUS | Status: AC
  Filled 2019-06-30: qty 10

## 2019-06-30 MED ORDER — MEPERIDINE HCL 50 MG/ML IJ SOLN: 6.2500 mg | INTRAMUSCULAR | Status: DC | PRN

## 2019-06-30 MED ORDER — ALUM & MAG HYDROXIDE-SIMETH 200-200-20 MG/5ML PO SUSP: 30.0000 mL | ORAL | Status: DC | PRN

## 2019-06-30 MED ORDER — KCL IN DEXTROSE-NACL 20-5-0.45 MEQ/L-%-% IV SOLN
INTRAVENOUS | Status: AC
  Administered 2019-06-30: 20:00:00 via INTRAVENOUS

## 2019-06-30 MED ORDER — HYDROMORPHONE HCL 1 MG/ML IJ SOLN
0.2500 mg | INTRAMUSCULAR | Status: DC | PRN
  Administered 2019-06-30 (×4): 0.5 mg via INTRAVENOUS
  Filled 2019-06-30 (×4): qty 0.5

## 2019-06-30 MED ORDER — DEXAMETHASONE SODIUM PHOSPHATE 10 MG/ML IJ SOLN
INTRAMUSCULAR | Status: AC
  Filled 2019-06-30: qty 1

## 2019-06-30 MED ORDER — SENNOSIDES-DOCUSATE SODIUM 8.6-50 MG PO TABS: 1.0000 | ORAL_TABLET | Freq: Every evening | ORAL | Status: DC | PRN

## 2019-06-30 MED ORDER — PROMETHAZINE HCL 25 MG/ML IJ SOLN
25.0000 mg | Freq: Four times a day (QID) | INTRAMUSCULAR | Status: DC | PRN
  Administered 2019-06-30 – 2019-07-01 (×2): 25 mg via INTRAVENOUS
  Filled 2019-06-30 (×2): qty 1

## 2019-06-30 MED ORDER — SODIUM CHLORIDE 0.9% FLUSH
INTRAVENOUS | Status: AC
  Filled 2019-06-30: qty 10

## 2019-06-30 MED ORDER — 0.9 % SODIUM CHLORIDE (POUR BTL) OPTIME
TOPICAL | Status: DC | PRN
  Administered 2019-06-30: 12:00:00 2000 mL

## 2019-06-30 MED ORDER — SCOPOLAMINE 1 MG/3DAYS TD PT72
MEDICATED_PATCH | TRANSDERMAL | Status: AC
  Filled 2019-06-30: qty 1

## 2019-06-30 MED ORDER — KETOROLAC TROMETHAMINE 30 MG/ML IJ SOLN
30.0000 mg | Freq: Once | INTRAMUSCULAR | Status: AC
  Administered 2019-06-30: 30 mg via INTRAVENOUS

## 2019-06-30 MED ORDER — ROCURONIUM BROMIDE 100 MG/10ML IV SOLN
INTRAVENOUS | Status: DC | PRN
  Administered 2019-06-30: 50 mg via INTRAVENOUS

## 2019-06-30 MED ORDER — DOCUSATE SODIUM 100 MG PO CAPS
100.0000 mg | ORAL_CAPSULE | Freq: Two times a day (BID) | ORAL | Status: DC
  Administered 2019-07-01: 100 mg via ORAL
  Filled 2019-06-30 (×2): qty 1

## 2019-06-30 MED ORDER — DEXAMETHASONE SODIUM PHOSPHATE 10 MG/ML IJ SOLN
INTRAMUSCULAR | Status: DC | PRN
  Administered 2019-06-30: 10 mg via INTRAVENOUS

## 2019-06-30 MED ORDER — GABAPENTIN 300 MG PO CAPS: 300.0000 mg | ORAL_CAPSULE | Freq: Once | ORAL | Status: DC | PRN

## 2019-06-30 MED ORDER — FENTANYL CITRATE (PF) 250 MCG/5ML IJ SOLN
INTRAMUSCULAR | Status: AC
  Filled 2019-06-30: qty 5

## 2019-06-30 MED ORDER — ONDANSETRON HCL 4 MG PO TABS
8.0000 mg | ORAL_TABLET | Freq: Four times a day (QID) | ORAL | Status: DC | PRN
  Administered 2019-06-30 – 2019-07-01 (×2): 8 mg via ORAL
  Filled 2019-06-30 (×2): qty 2

## 2019-06-30 MED ORDER — LIDOCAINE HCL 1 % IJ SOLN
INTRAMUSCULAR | Status: DC | PRN
  Administered 2019-06-30: 60 mg via INTRADERMAL

## 2019-06-30 MED ORDER — SODIUM CHLORIDE 0.9 % IV SOLN
8.0000 mg | Freq: Four times a day (QID) | INTRAVENOUS | Status: DC | PRN
  Filled 2019-06-30: qty 4

## 2019-06-30 MED ORDER — CEFAZOLIN SODIUM-DEXTROSE 2-4 GM/100ML-% IV SOLN
2.0000 g | INTRAVENOUS | Status: AC
  Administered 2019-06-30: 2 g via INTRAVENOUS

## 2019-06-30 MED ORDER — KETOROLAC TROMETHAMINE 30 MG/ML IJ SOLN
30.0000 mg | Freq: Four times a day (QID) | INTRAMUSCULAR | Status: DC
  Administered 2019-06-30 – 2019-07-01 (×3): 30 mg via INTRAVENOUS
  Filled 2019-06-30 (×3): qty 1

## 2019-06-30 MED ORDER — KETOROLAC TROMETHAMINE 30 MG/ML IJ SOLN
INTRAMUSCULAR | Status: AC
  Filled 2019-06-30: qty 1

## 2019-06-30 MED ORDER — HEMOSTATIC AGENTS (NO CHARGE) OPTIME
TOPICAL | Status: DC | PRN
  Administered 2019-06-30: 1 via TOPICAL

## 2019-06-30 MED ORDER — GABAPENTIN 300 MG PO CAPS
ORAL_CAPSULE | ORAL | Status: AC
  Filled 2019-06-30: qty 1

## 2019-06-30 MED ORDER — IBUPROFEN 800 MG PO TABS: 800.0000 mg | ORAL_TABLET | Freq: Four times a day (QID) | ORAL | Status: DC

## 2019-06-30 MED ORDER — LIDOCAINE 2% (20 MG/ML) 5 ML SYRINGE
INTRAMUSCULAR | Status: AC
  Filled 2019-06-30: qty 5

## 2019-06-30 MED ORDER — DIPHENHYDRAMINE HCL 50 MG/ML IJ SOLN: 25.0000 mg | Freq: Four times a day (QID) | INTRAMUSCULAR | Status: DC | PRN

## 2019-06-30 MED ORDER — PROPOFOL 10 MG/ML IV BOLUS
INTRAVENOUS | Status: DC | PRN
  Administered 2019-06-30: 150 mg via INTRAVENOUS

## 2019-06-30 MED ORDER — MIDAZOLAM HCL 5 MG/5ML IJ SOLN
INTRAMUSCULAR | Status: DC | PRN
  Administered 2019-06-30: 2 mg via INTRAVENOUS

## 2019-06-30 MED ORDER — ALPRAZOLAM 1 MG PO TABS
1.0000 mg | ORAL_TABLET | Freq: Three times a day (TID) | ORAL | Status: DC
  Administered 2019-06-30 – 2019-07-01 (×2): 1 mg via ORAL
  Filled 2019-06-30 (×2): qty 1

## 2019-06-30 MED ORDER — OXYCODONE HCL 5 MG PO TABS
5.0000 mg | ORAL_TABLET | ORAL | Status: DC | PRN
  Administered 2019-06-30: 20:00:00 10 mg via ORAL
  Administered 2019-07-01: 5 mg via ORAL
  Filled 2019-06-30: qty 2
  Filled 2019-06-30: qty 1

## 2019-06-30 MED ORDER — FENTANYL CITRATE (PF) 100 MCG/2ML IJ SOLN
INTRAMUSCULAR | Status: DC | PRN
  Administered 2019-06-30: 50 ug via INTRAVENOUS
  Administered 2019-06-30 (×2): 100 ug via INTRAVENOUS

## 2019-06-30 SURGICAL SUPPLY — 45 items
APPLIER CLIP 13 LRG OPEN (CLIP)
CELLS DAT CNTRL 66122 CELL SVR (MISCELLANEOUS) ×2 IMPLANT
CLIP APPLIE 13 LRG OPEN (CLIP) IMPLANT
CLOTH BEACON ORANGE TIMEOUT ST (SAFETY) ×4 IMPLANT
COVER LIGHT HANDLE STERIS (MISCELLANEOUS) ×8 IMPLANT
COVER WAND RF STERILE (DRAPES) ×4 IMPLANT
DERMABOND ADVANCED (GAUZE/BANDAGES/DRESSINGS) ×2
DERMABOND ADVANCED .7 DNX12 (GAUZE/BANDAGES/DRESSINGS) ×2 IMPLANT
DRAPE WARM FLUID 44X44 (DRAPES) ×4 IMPLANT
DRSG OPSITE POSTOP 4X8 (GAUZE/BANDAGES/DRESSINGS) ×4 IMPLANT
ELECT REM PT RETURN 9FT ADLT (ELECTROSURGICAL) ×4
ELECTRODE REM PT RTRN 9FT ADLT (ELECTROSURGICAL) ×2 IMPLANT
GAUZE 4X4 16PLY RFD (DISPOSABLE) ×4 IMPLANT
GLOVE BIO SURGEON STRL SZ7 (GLOVE) ×8 IMPLANT
GLOVE BIOGEL PI IND STRL 7.0 (GLOVE) ×8 IMPLANT
GLOVE BIOGEL PI IND STRL 8 (GLOVE) ×2 IMPLANT
GLOVE BIOGEL PI INDICATOR 7.0 (GLOVE) ×8
GLOVE BIOGEL PI INDICATOR 8 (GLOVE) ×2
GLOVE ECLIPSE 6.5 STRL STRAW (GLOVE) ×4 IMPLANT
GLOVE ECLIPSE 8.0 STRL XLNG CF (GLOVE) ×4 IMPLANT
GOWN STRL REUS W/TWL LRG LVL3 (GOWN DISPOSABLE) ×8 IMPLANT
GOWN STRL REUS W/TWL XL LVL3 (GOWN DISPOSABLE) ×4 IMPLANT
HEMOSTAT ARISTA ABSORB 3G PWDR (HEMOSTASIS) ×4 IMPLANT
INST SET MAJOR GENERAL (KITS) ×4 IMPLANT
KIT TURNOVER KIT A (KITS) ×4 IMPLANT
MANIFOLD NEPTUNE II (INSTRUMENTS) ×4 IMPLANT
NEEDLE HYPO 21X1.5 SAFETY (NEEDLE) ×4 IMPLANT
NS IRRIG 1000ML POUR BTL (IV SOLUTION) ×8 IMPLANT
PACK ABDOMINAL MAJOR (CUSTOM PROCEDURE TRAY) ×4 IMPLANT
PAD ARMBOARD 7.5X6 YLW CONV (MISCELLANEOUS) ×4 IMPLANT
RTRCTR WOUND ALEXIS 18CM MED (MISCELLANEOUS) ×4
SET BASIN LINEN APH (SET/KITS/TRAYS/PACK) ×4 IMPLANT
SPONGE LAP 18X18 RF (DISPOSABLE) ×8 IMPLANT
SUT CHROMIC 0 CT 1 (SUTURE) ×8 IMPLANT
SUT MON AB 3-0 SH 27 (SUTURE) ×8 IMPLANT
SUT PLAIN 2 0 XLH (SUTURE) IMPLANT
SUT VIC AB 0 CT1 27 (SUTURE) ×4
SUT VIC AB 0 CT1 27XBRD ANTBC (SUTURE) IMPLANT
SUT VIC AB 0 CT1 27XCR 8 STRN (SUTURE) ×4 IMPLANT
SUT VIC AB 0 CTX 36 (SUTURE) ×2
SUT VIC AB 0 CTX36XBRD ANTBCTR (SUTURE) ×2 IMPLANT
SUT VICRYL 3 0 (SUTURE) ×4 IMPLANT
SYR 20CC LL (SYRINGE) ×4 IMPLANT
TOWEL SURG RFD BLUE STRL DISP (DISPOSABLE) ×4 IMPLANT
TRAY FOLEY MTR SLVR 16FR STAT (SET/KITS/TRAYS/PACK) ×4 IMPLANT

## 2019-06-30 NOTE — Op Note (Signed)
Preoperative diagnosis:  1.  12 week fibroid uterus                                          2.  RLQ pain                                         3.  Dyspareunia                                          Postoperative diagnosis:  Same as above + right paratubal cyst  Procedure:  Abdominal hysterectomy, bilateral salpingectomy  Surgeon:  Florian Buff  Assistant:    Anesthesia:  General endotracheal  Preoperative clinical summary:    Pt is seen for follow up from ED  CT reveals a 12-14 week size fibroid uterus  She had acute onset of right sided abdominal pain last week  She has had years of heavy painful periods  Also has had increasing pain with sex to the point now that she cnnot have sex  S/p tubal ligation   Intraoperative findings: fibroids, right paratubal cyst  Description of operation:  Patient was taken to the operating room and placed in the supine position where she underwent general endotracheal anesthesia.  She was then prepped and draped in the usual sterile fashion and a Foley catheter was placed for continuous bladder drainage.  A Pfannenstiel skin incision was made and carried down sharply to the rectus fascia which was scored in the midline and extended laterally.  The fascia was taken off the muscles superiorly and inferiorly without difficulty.  The muscles were divided.  The peritoneal cavity was entered.  An medium Alexis self-retaining retractor was placed.  The upper abdomen was packed away. Both uterine cornu were grasped with Coker clamps.  The left round ligament was suture ligated and coagulated with the electrocautery unit.  The left vesicouterine serosal flap was created.  An avascular window in in the peritoneum was created and the utero-ovarian ligament was cross clamped, cut and suture ligated.  The right round ligament was suture ligated and cut with the electrocautery unit.  The vesicouterine serosal flap on the right was created.  An avascular window in the  peritoneum was created and the right utero-ovarian ligament was cross clamped, cut and double suture ligated.  Thus both ovaries were preserved.  The uterine vessels were skeletonized bilaterally.  The uterine vessels were clamped bilaterally,  then cut and suture ligated.  Two more pedicles were taken down the cervix medial to the uterine vessels. The vagina was cross clamped uterus and cervix removed.  Vaginal angle sutures were placed and the vagina was closed using interrupted figure of eight sutures.  The pelvis was irrigated vigorously and all pedicles were examined and found to be hemostatic.  Both Fallpoian tubes were removed by cross clamping with a Kelley clamp and a fore and aft 3-0 monocryl suture for hemostasis.  All specimens were sent to pathology for routine evaluation.  The Alexis self-retaining retractor was removed and the pelvis was irrigated vigorously.  All packs were removed and all counts were correct at this point x 3.  The muscles and peritoneum were reapproximated  loosely.  The fascia was closed with 0 Vicryl running.  The skin was closed using 3-0 Vicryl on a Keith needle in a subcuticular fashion.  Dermabond was then applied for additional wound integrity and to serve as a postoperative bacterial barrier.  The patient was awakened from anesthesia taken to the recovery room in good stable condition. All sponge instrument and needle counts were correct x 3.  The patient received Ancef and Toradol prophylactically preoperatively.  Estimated blood loss for the procedure was 100  cc.  Florian Buff, MD  06/30/2019 12:30 PM

## 2019-06-30 NOTE — Anesthesia Postprocedure Evaluation (Signed)
Anesthesia Post Note  Patient: Kelsey Walton  Procedure(s) Performed: HYSTERECTOMY ABDOMINAL (N/A Abdomen) OPEN BILATERAL SALPINGECTOMY (Bilateral Abdomen)  Patient location during evaluation: PACU Anesthesia Type: General Level of consciousness: awake and alert and patient cooperative Pain management: satisfactory to patient Vital Signs Assessment: post-procedure vital signs reviewed and stable Respiratory status: spontaneous breathing Postop Assessment: no apparent nausea or vomiting Anesthetic complications: no     Last Vitals:  Vitals:   06/30/19 1430 06/30/19 1431  BP:  112/69  Pulse: 78 75  Resp: 14 11  Temp:    SpO2: 99% 100%    Last Pain:  Vitals:   06/30/19 1425  TempSrc:   PainSc: Asleep                 Lise Pincus

## 2019-06-30 NOTE — Progress Notes (Signed)
Pt on PACU Hold since 1325. Previous nurse was told that there was several discharge awaiting and a bed may be available in an hour. Oncoming RN repeated call at 1500 and stated no discharges. Ann Hunt,RN  notified and communicated that the discharges that are scheduled are waiting for placement beds. She said the beds would then have to be cleaned and it would likely be between 5p- 7p before PACU pt could have assigned bed on Unit 300.

## 2019-06-30 NOTE — Transfer of Care (Signed)
Immediate Anesthesia Transfer of Care Note  Patient: Kelsey Walton  Procedure(s) Performed: HYSTERECTOMY ABDOMINAL (N/A Abdomen) OPEN BILATERAL SALPINGECTOMY (Bilateral Abdomen)  Patient Location: PACU  Anesthesia Type:General  Level of Consciousness: awake and patient cooperative  Airway & Oxygen Therapy: Patient Spontanous Breathing and Patient connected to face mask oxygen  Post-op Assessment: Report given to RN and Post -op Vital signs reviewed and stable  Post vital signs: Reviewed and stable  Last Vitals:  Vitals Value Taken Time  BP    Temp    Pulse    Resp    SpO2      Last Pain:  Vitals:   06/30/19 1002  TempSrc: Oral  PainSc: 0-No pain      Patients Stated Pain Goal: 9 (97/94/80 1655)  Complications: No apparent anesthesia complications

## 2019-06-30 NOTE — Anesthesia Preprocedure Evaluation (Signed)
Anesthesia Evaluation  Patient identified by MRN, date of birth, ID band Patient awake    Reviewed: Allergy & Precautions, NPO status , Patient's Chart, lab work & pertinent test results  Airway Mallampati: II  TM Distance: >3 FB Neck ROM: Full    Dental  (+) Missing,  Right Upper Missing tooth x1 :   Pulmonary former smoker,    Pulmonary exam normal breath sounds clear to auscultation       Cardiovascular Exercise Tolerance: Good Normal cardiovascular exam Rhythm:Regular Rate:Normal     Neuro/Psych Seizures - (As per patient, her seizures are secondary to PTSD not real seizures, last seizre like episode 02/2019, her orimary care physician aware),  PSYCHIATRIC DISORDERS Anxiety Depression PTSD   GI/Hepatic negative GI ROS, (+)     substance abuse (last use of marijuana - 06/29/19)  marijuana use,   Endo/Other  negative endocrine ROS  Renal/GU negative Renal ROS     Musculoskeletal  (+) Arthritis  (sacral pain),   Abdominal   Peds  Hematology negative hematology ROS (+)   Anesthesia Other Findings   Reproductive/Obstetrics                             Anesthesia Physical Anesthesia Plan  ASA: II  Anesthesia Plan: General   Post-op Pain Management:    Induction: Intravenous  PONV Risk Score and Plan: 4 or greater and Ondansetron, Dexamethasone and Scopolamine patch - Pre-op  Airway Management Planned: Oral ETT  Additional Equipment:   Intra-op Plan:   Post-operative Plan: Extubation in OR  Informed Consent: I have reviewed the patients History and Physical, chart, labs and discussed the procedure including the risks, benefits and alternatives for the proposed anesthesia with the patient or authorized representative who has indicated his/her understanding and acceptance.     Dental advisory given  Plan Discussed with: CRNA  Anesthesia Plan Comments:          Anesthesia Quick Evaluation

## 2019-06-30 NOTE — Anesthesia Procedure Notes (Signed)
Procedure Name: Intubation Date/Time: 06/30/2019 11:12 AM Performed by: Denese Killings, MD Pre-anesthesia Checklist: Patient identified, Patient being monitored, Timeout performed, Emergency Drugs available and Suction available Patient Re-evaluated:Patient Re-evaluated prior to induction Oxygen Delivery Method: Circle System Utilized Preoxygenation: Pre-oxygenation with 100% oxygen Induction Type: IV induction Ventilation: Mask ventilation without difficulty Laryngoscope Size: Mac and 3 Grade View: Grade II Tube type: Oral Tube size: 7.0 mm Number of attempts: 1 Airway Equipment and Method: stylet Placement Confirmation: ETT inserted through vocal cords under direct vision,  positive ETCO2 and breath sounds checked- equal and bilateral Secured at: 20 cm Tube secured with: Tape Dental Injury: Teeth and Oropharynx as per pre-operative assessment

## 2019-06-30 NOTE — Interval H&P Note (Signed)
History and Physical Interval Note:  06/30/2019 10:40 AM  Kelsey Walton  has presented today for surgery, with the diagnosis of Fibroids Dyspareunia Dysmenorrhea Dysmenorrhea.  The various methods of treatment have been discussed with the patient and family. After consideration of risks, benefits and other options for treatment, the patient has consented to  Procedure(s): HYSTERECTOMY ABDOMINAL (N/A) OPEN BILATERAL SALPINGECTOMY (Bilateral) as a surgical intervention.  The patient's history has been reviewed, patient examined, no change in status, stable for surgery.  I have reviewed the patient's chart and labs.  Questions were answered to the patient's satisfaction.     Florian Buff

## 2019-06-30 NOTE — H&P (Signed)
Chief Complaint  Patient presents with  . ER follow-up    lower abdominal pain/ had CT Scan  38 y.o. [id number] Patient's last menstrual period was 05/26/2019. The current method of family planning is tubal ligation.      Outpatient Encounter Medications as of 06/10/2019  Medication Sig  . ALPRAZolam (XANAX) 1 MG tablet Take 1 mg by mouth 2 (two) times daily.   Marland Kitchen HYDROcodone-acetaminophen (NORCO/VICODIN) 5-325 MG tablet Take one tab po q 4 hrs prn pain  . ibuprofen (ADVIL) 400 MG tablet Take 1 tablet (400 mg total) by mouth every 6 (six) hours as needed.  Marland Kitchen imipramine (TOFRANIL) 50 MG tablet Take 50 mg by mouth at bedtime.  . cyclobenzaprine (FLEXERIL) 5 MG tablet Take 1 tablet (5 mg total) by mouth 3 (three) times daily as needed for muscle spasms. (Patient not taking: Reported on 06/10/2019)  . ketorolac (TORADOL) 10 MG tablet Take 1 tablet (10 mg total) by mouth every 8 (eight) hours as needed.  . ondansetron (ZOFRAN ODT) 4 MG disintegrating tablet Take 1 tablet (4 mg total) by mouth every 8 (eight) hours as needed for nausea or vomiting. (Patient not taking: Reported on 06/10/2019)  . ondansetron (ZOFRAN ODT) 8 MG disintegrating tablet Take 1 tablet (8 mg total) by mouth every 8 (eight) hours as needed for nausea or vomiting.   No facility-administered encounter medications on file as of 06/10/2019.   Subjective  Pt is seen for follow up from ED  CT reveals a 12-14 week size fibroid uterus  She had acute onset of right sided abdominal pain last week  She has had years of heavy painful periods  Also has had increasing pain with sex to the point now that she cnnot have sex  S/p tubal ligation      Past Medical History:  Diagnosis Date  . Anxiety   . PTSD (post-traumatic stress disorder)   . Seizures ([place])         Past Surgical History:  Procedure Laterality Date  . AUGMENTATION MAMMAPLASTY Bilateral 2008   saline  . TUBAL LIGATION             OB History     Gravida Para  Term Preterm AB Living   _0 SAB TAB Ectopic Multiple Live Births             Obstetric Comments   Menstrual age:  Age 1st Pregnancy: 8      No Known Allergies  Social History        Socioeconomic History  . Marital status: Divorced    Spouse name: Not on file  . Number of children: 2  . Years of education: Not on file  . Highest education level: Not on file  Occupational History  . Not on file  Social Needs  . Financial resource strain: Not on file  . Food insecurity    Worry: Not on file    Inability: Not on file  . Transportation needs    Medical: Not on file    Non-medical: Not on file  Tobacco Use  . Smoking status: Former Smoker    Quit date: 12/15/2012    Years since quitting: 6.4  . Smokeless tobacco: Never Used  Substance and Sexual Activity  . Alcohol use: Yes    Alcohol/week: 0.0 standard drinks    Comment: occ   . Drug use: Yes    Types: Marijuana  Comment: last used 1 month ago as of 02/02/19  . Sexual activity: Yes    Birth control/protection: Surgical  Lifestyle  . Physical activity    Days per week: Not on file    Minutes per session: Not on file  . Stress: Not on file  Relationships  . Social Herbalist on phone: Not on file    Gets together: Not on file    Attends religious service: Not on file    Active member of club or organization: Not on file    Attends meetings of clubs or organizations: Not on file    Relationship status: Not on file  Other Topics Concern  . Not on file  Social History Narrative  . Not on file        Family History  Problem Relation Age of Onset  . Breast cancer Maternal Aunt    great aunt  . Leukemia Maternal Grandfather   . Prostate cancer Maternal Grandfather   . Multiple myeloma Father    lung, neck, bone  Medications:  Current Outpatient Medications:  . ALPRAZolam (XANAX) 1 MG tablet, Take 1 mg by mouth 2 (two) times daily. , Disp: , Rfl:  . HYDROcodone-acetaminophen  (NORCO/VICODIN) 5-325 MG tablet, Take one tab po q 4 hrs prn pain, Disp: 8 tablet, Rfl: 0  . ibuprofen (ADVIL) 400 MG tablet, Take 1 tablet (400 mg total) by mouth every 6 (six) hours as needed., Disp: 21 tablet, Rfl: 0  . imipramine (TOFRANIL) 50 MG tablet, Take 50 mg by mouth at bedtime., Disp: , Rfl:  . cyclobenzaprine (FLEXERIL) 5 MG tablet, Take 1 tablet (5 mg total) by mouth 3 (three) times daily as needed for muscle spasms. (Patient not taking: Reported on 06/10/2019), Disp: 15 tablet, Rfl: 0  . ketorolac (TORADOL) 10 MG tablet, Take 1 tablet (10 mg total) by mouth every 8 (eight) hours as needed., Disp: 15 tablet, Rfl: 0  . ondansetron (ZOFRAN ODT) 4 MG disintegrating tablet, Take 1 tablet (4 mg total) by mouth every 8 (eight) hours as needed for nausea or vomiting. (Patient not taking: Reported on 06/10/2019), Disp: 20 tablet, Rfl: 0  . ondansetron (ZOFRAN ODT) 8 MG disintegrating tablet, Take 1 tablet (8 mg total) by mouth every 8 (eight) hours as needed for nausea or vomiting., Disp: 20 tablet, Rfl: 0  Objective  Blood pressure 98/65, pulse 72, height 5' (1.524 m), weight 122 lb 6.4 oz (55.5 kg), last menstrual period 05/26/2019.  General WDWN female NAD  Vulva: normal appearing vulva with no masses, tenderness or lesions  Vagina: normal mucosa, no discharge  Cervix: Normal no lesions  Uterus: 12-14 weeks size with fibroids 2 predominant  Adnexa: ovaries:present, normal adnexa in size, nontender and no masses  Pertinent ROS  No burning with urination, frequency or urgency  No nausea, vomiting or diarrhea  Nor fever chills or other constitutional symptoms  Labs or studies  Reviewed all of her labs and scans from recent ED visit   Results for orders placed or performed during the hospital encounter of 06/28/19 (from the past 168 hour(s))  CBC   Collection Time: 06/28/19  8:44 AM  Result Value Ref Range   WBC 5.0 4.0 - 10.5 K/uL   RBC 4.12 3.87 - 5.11 MIL/uL   Hemoglobin 12.4 12.0 -  15.0 g/dL   HCT 37.9 36.0 - 46.0 %   MCV 92.0 80.0 - 100.0 fL   MCH 30.1 26.0 - 34.0 pg  MCHC 32.7 30.0 - 36.0 g/dL   RDW 12.8 11.5 - 15.5 %   Platelets 253 150 - 400 K/uL   nRBC 0.0 0.0 - 0.2 %  Comprehensive metabolic panel   Collection Time: 06/28/19  8:44 AM  Result Value Ref Range   Sodium 137 135 - 145 mmol/L   Potassium 3.6 3.5 - 5.1 mmol/L   Chloride 107 98 - 111 mmol/L   CO2 22 22 - 32 mmol/L   Glucose, Bld 86 70 - 99 mg/dL   BUN 15 6 - 20 mg/dL   Creatinine, Ser 0.57 0.44 - 1.00 mg/dL   Calcium 8.8 (L) 8.9 - 10.3 mg/dL   Total Protein 6.9 6.5 - 8.1 g/dL   Albumin 4.1 3.5 - 5.0 g/dL   AST 14 (L) 15 - 41 U/L   ALT 13 0 - 44 U/L   Alkaline Phosphatase 45 38 - 126 U/L   Total Bilirubin 0.5 0.3 - 1.2 mg/dL   GFR calc non Af Amer >60 >60 mL/min   GFR calc Af Amer >60 >60 mL/min   Anion gap 8 5 - 15  hCG, quantitative, pregnancy   Collection Time: 06/28/19  8:44 AM  Result Value Ref Range   hCG, Beta Chain, Quant, S <1 <5 mIU/mL  Rapid HIV screen (HIV 1/2 Ab+Ag)   Collection Time: 06/28/19  8:44 AM  Result Value Ref Range   HIV-1 P24 Antigen - HIV24 NON REACTIVE NON REACTIVE   HIV 1/2 Antibodies NON REACTIVE NON REACTIVE   Interpretation (HIV Ag Ab)      A non reactive test result means that HIV 1 or HIV 2 antibodies and HIV 1 p24 antigen were not detected in the specimen.  Urinalysis, Routine w reflex microscopic   Collection Time: 06/28/19  8:44 AM  Result Value Ref Range   Color, Urine STRAW (A) YELLOW   APPearance CLEAR CLEAR   Specific Gravity, Urine 1.015 1.005 - 1.030   pH 5.0 5.0 - 8.0   Glucose, UA NEGATIVE NEGATIVE mg/dL   Hgb urine dipstick NEGATIVE NEGATIVE   Bilirubin Urine NEGATIVE NEGATIVE   Ketones, ur NEGATIVE NEGATIVE mg/dL   Protein, ur NEGATIVE NEGATIVE mg/dL   Nitrite NEGATIVE NEGATIVE   Leukocytes,Ua NEGATIVE NEGATIVE  Type and screen   Collection Time: 06/28/19  8:44 AM  Result Value Ref Range   ABO/RH(D) A POS    Antibody Screen NEG     Sample Expiration      07/01/2019,2359 Performed at [facility], [address], [place], [place] [address]   Results for orders placed or performed during the hospital encounter of 06/28/19 (from the past 168 hour(s))  SARS CORONAVIRUS 2 Nasal Swab Aptima Multi Swab   Collection Time: 06/28/19  8:44 AM   Specimen: Aptima Multi Swab; Nasal Swab  Result Value Ref Range   SARS Coronavirus 2 NEGATIVE NEGATIVE   Ct Abdomen Pelvis W Contrast  Result Date: 06/07/2019 CLINICAL DATA:  Right lower quadrant pain. EXAM: CT ABDOMEN AND PELVIS WITH CONTRAST TECHNIQUE: Multidetector CT imaging of the abdomen and pelvis was performed using the standard protocol following bolus administration of intravenous contrast. CONTRAST:  117m OMNIPAQUE IOHEXOL 300 MG/ML  SOLN COMPARISON:  None. FINDINGS: Lower chest: Lung bases are clear. No effusions. Heart is normal size. Hepatobiliary: No focal hepatic abnormality. Gallbladder unremarkable. Pancreas: No focal abnormality or ductal dilatation. Spleen: No focal abnormality.  Normal size. Adrenals/Urinary Tract: No adrenal abnormality. No focal renal abnormality. No stones or hydronephrosis. Urinary bladder  is unremarkable. Stomach/Bowel: Normal appendix. Stomach, large and small bowel grossly unremarkable. Vascular/Lymphatic: Scattered calcifications in the aorta. No evidence of aneurysm or adenopathy. Reproductive: Multiple uterine fibroids. Bilateral ovarian follicles. 2.5 cm cyst or dominant follicle in the left ovary. Other: Trace free fluid in the pelvis.  No free air. Musculoskeletal: No acute bony abnormality. IMPRESSION: Fibroid uterus. Normal appendix. No acute findings in the abdomen or pelvis. Electronically Signed   By: Rolm Baptise M.D.   On: 06/07/2019 23:49   Impression  Diagnoses this Encounter::    ICD-10-CM   1. Fibroids  D21.9   2. Dyspareunia, female  N94.10   3. Menorrhagia with regular cycle  N92.0   4. Dysmenorrhea  N94.6   Established  relevant diagnosis(es):  Plan/Recommendations:      Meds ordered this encounter  Medications  . ketorolac (TORADOL) 10 MG tablet    Sig: Take 1 tablet (10 mg total) by mouth every 8 (eight) hours as needed.    Dispense: 15 tablet    Refill: 0  . ondansetron (ZOFRAN ODT) 8 MG disintegrating tablet    Sig: Take 1 tablet (8 mg total) by mouth every 8 (eight) hours as needed for nausea or vomiting.    Dispense: 20 tablet    Refill: 0  Labs or Scans Ordered:  No orders of the defined types were placed in this encounter.  Management::  >schedule TAH thru mini lap incision with removal of tubes 06/30/2019

## 2019-07-01 ENCOUNTER — Encounter (HOSPITAL_COMMUNITY): Payer: Self-pay | Admitting: Obstetrics & Gynecology

## 2019-07-01 LAB — BASIC METABOLIC PANEL
Anion gap: 4 — ABNORMAL LOW (ref 5–15)
BUN: 7 mg/dL (ref 6–20)
CO2: 26 mmol/L (ref 22–32)
Calcium: 8.7 mg/dL — ABNORMAL LOW (ref 8.9–10.3)
Chloride: 108 mmol/L (ref 98–111)
Creatinine, Ser: 0.62 mg/dL (ref 0.44–1.00)
GFR calc Af Amer: >60 mL/min (ref >60)
GFR calc non Af Amer: >60 mL/min (ref >60)
Glucose, Bld: 104 mg/dL — ABNORMAL HIGH (ref 70–99)
Potassium: 3.8 mmol/L (ref 3.5–5.1)
Sodium: 138 mmol/L (ref 135–145)

## 2019-07-01 LAB — CBC
HCT: 37 % (ref 36.0–46.0)
Hemoglobin: 12.1 g/dL (ref 12.0–15.0)
MCH: 30.6 pg (ref 26.0–34.0)
MCHC: 32.7 g/dL (ref 30.0–36.0)
MCV: 93.4 fL (ref 80.0–100.0)
Platelets: 261 10*3/uL (ref 150–400)
RBC: 3.96 MIL/uL (ref 3.87–5.11)
RDW: 13.1 % (ref 11.5–15.5)
WBC: 12.8 10*3/uL — ABNORMAL HIGH (ref 4.0–10.5)
nRBC: 0 % (ref 0.0–0.2)

## 2019-07-01 MED ORDER — KETOROLAC TROMETHAMINE 10 MG PO TABS: 10.0000 mg | ORAL_TABLET | Freq: Three times a day (TID) | ORAL | 0 refills | Status: DC | PRN

## 2019-07-01 MED ORDER — PROMETHAZINE HCL 25 MG PO TABS: 25.0000 mg | ORAL_TABLET | Freq: Four times a day (QID) | ORAL | 1 refills | Status: DC | PRN

## 2019-07-01 MED ORDER — OXYCODONE HCL 5 MG PO TABS: 5.0000 mg | ORAL_TABLET | ORAL | 0 refills | Status: DC | PRN

## 2019-07-01 NOTE — Discharge Instructions (Signed)
Abdominal Hysterectomy, Care After °This sheet gives you information about how to care for yourself after your procedure. Your health care provider may also give you more specific instructions. If you have problems or questions, contact your health care provider. °What can I expect after the procedure? °After your procedure, it is common to have: °· Pain. °· Fatigue. °· Poor appetite. °· Less interest in sex. °· Vaginal bleeding and discharge. You may need to use a sanitary napkin after this procedure. °Follow these instructions at home: °Bathing °· Do not take baths, swim, or use a hot tub until your health care provider approves. Ask your health care provider if you can take showers. You may only be allowed to take sponge baths for bathing. °· Keep the bandage (dressing) dry until your health care provider says it can be removed. °Incision care ° °· Follow instructions from your health care provider about how to take care of your incision. Make sure you: °? Wash your hands with soap and water before you change your bandage (dressing). If soap and water are not available, use hand sanitizer. °? Change your dressing as told by your health care provider. °? Leave stitches (sutures), skin glue, or adhesive strips in place. These skin closures may need to stay in place for 2 weeks or longer. If adhesive strip edges start to loosen and curl up, you may trim the loose edges. Do not remove adhesive strips completely unless your health care provider tells you to do that. °· Check your incision area every day for signs of infection. Check for: °? Redness, swelling, or pain. °? Fluid or blood. °? Warmth. °? Pus or a bad smell. °Activity °· Do gentle, daily exercises as told by your health care provider. You may be told to take short walks every day and go farther each time. °· Do not lift anything that is heavier than 10 lb (4.5 kg), or the limit that your health care provider tells you, until he or she says that it is  safe. °· Do not drive or use heavy machinery while taking prescription pain medicine. °· Do not drive for 24 hours if you were given a medicine to help you relax (sedative). °· Follow your health care provider's instructions about exercise, driving, and general activities. Ask your health care provider what activities are safe for you. °Lifestyle °· Do not douche, use tampons, or have sex for at least 6 weeks or as told by your health care provider. °· Do not drink alcohol until your health care provider approves. °· Drink enough fluid to keep your urine clear or pale yellow. °· Try to have someone at home with you for the first 1-2 weeks to help. °· Do not use any products that contain nicotine or tobacco, such as cigarettes and e-cigarettes. These can delay healing. If you need help quitting, ask your health care provider. °General instructions °· Take over-the-counter and prescription medicines only as told by your health care provider. °· Do not take aspirin or ibuprofen. These medicines can cause bleeding. °· To prevent or treat constipation while you are taking prescription pain medicine, your health care provider may recommend that you: °? Drink enough fluid to keep your urine clear or pale yellow. °? Take over-the-counter or prescription medicines. °? Eat foods that are high in fiber, such as fresh fruits and vegetables, whole grains, and beans. °? Limit foods that are high in fat and processed sugars, such as fried and sweet foods. °· Keep all   follow-up visits as told by your health care provider. This is important. °Contact a health care provider if: °· You have chills or fever. °· You have redness, swelling, or pain around your incision. °· You have fluid or blood coming from your incision. °· Your incision feels warm to the touch. °· You have pus or a bad smell coming from your incision. °· Your incision breaks open. °· You feel dizzy or light-headed. °· You have pain or bleeding when you urinate. °· You  have persistent diarrhea. °· You have persistent nausea and vomiting. °· You have abnormal vaginal discharge. °· You have a rash. °· You have any type of abnormal reaction or you develop an allergy to your medicine. °· Your pain medicine does not help. °Get help right away if: °· You have a fever and your symptoms suddenly get worse. °· You have severe abdominal pain. °· You have shortness of breath. °· You faint. °· You have pain, swelling, or redness in your leg. °· You have heavy vaginal bleeding with blood clots. °Summary °· After your procedure, it is common to have pain, fatigue and vaginal discharge. °· Do not take baths, swim, or use a hot tub until your health care provider approves. Ask your health care provider if you can take showers. You may only be allowed to take sponge baths for bathing. °· Follow your health care provider's instructions about exercise, driving, and general activities. Ask your health care provider what activities are safe for you. °· Do not lift anything that is heavier than 10 lb (4.5 kg), or the limit that your health care provider tells you, until he or she says that it is safe. °· Try to have someone at home with you for the first 1-2 weeks to help. °This information is not intended to replace advice given to you by your health care provider. Make sure you discuss any questions you have with your health care provider. °Document Released: 05/31/2005 Document Revised: 12/15/2018 Document Reviewed: 10/30/2016 °Elsevier Patient Education © 2020 [employer] ° °

## 2019-07-01 NOTE — Progress Notes (Signed)
Educated pt that ambulating in hallway and sitting up in chair is a part of her plan of care. Pt refused at this time. Pros of ambulating explained to pt; pt continues to refuse. Will continue to encourage. Pt is ambulating in room to and from restroom. Pt also refused SCD and nurse educated on use.

## 2019-07-01 NOTE — Discharge Summary (Signed)
Physician Discharge Summary  Patient ID: Kelsey Walton MRN: [id number] DOB/AGE: 01/07/1981 38 y.o.  Admit date: 06/30/2019 Discharge date: 07/01/2019  Admission Diagnoses:s/p TAH  Discharge Diagnoses:  Active Problems:   S/P hysterectomy   Discharged Condition: good  Hospital Course: unremarkable post op course  Consults: None  Significant Diagnostic Studies: labs:   Results for orders placed or performed during the hospital encounter of 06/30/19 (from the past 24 hour(s))  CBC   Collection Time: 07/01/19  4:26 AM  Result Value Ref Range   WBC 12.8 (H) 4.0 - 10.5 K/uL   RBC 3.96 3.87 - 5.11 MIL/uL   Hemoglobin 12.1 12.0 - 15.0 g/dL   HCT 37.0 36.0 - 46.0 %   MCV 93.4 80.0 - 100.0 fL   MCH 30.6 26.0 - 34.0 pg   MCHC 32.7 30.0 - 36.0 g/dL   RDW 13.1 11.5 - 15.5 %   Platelets 261 150 - 400 K/uL   nRBC 0.0 0.0 - 0.2 %  Basic metabolic panel   Collection Time: 07/01/19  4:26 AM  Result Value Ref Range   Sodium 138 135 - 145 mmol/L   Potassium 3.8 3.5 - 5.1 mmol/L   Chloride 108 98 - 111 mmol/L   CO2 26 22 - 32 mmol/L   Glucose, Bld 104 (H) 70 - 99 mg/dL   BUN 7 6 - 20 mg/dL   Creatinine, Ser 0.62 0.44 - 1.00 mg/dL   Calcium 8.7 (L) 8.9 - 10.3 mg/dL   GFR calc non Af Amer >60 >60 mL/min   GFR calc Af Amer >60 >60 mL/min   Anion gap 4 (L) 5 - 15     Treatments: surgery: TAH BS  Discharge Exam: Blood pressure 105/61, pulse 64, temperature 98.2 F (36.8 C), temperature source Oral, resp. rate 16, height 5' (1.524 m), weight 55.8 kg, SpO2 100 %. General appearance: alert, cooperative and no distress GI: soft, non-tender; bowel sounds normal; no masses,  no organomegaly Incision/Wound:clean dry dressing  Disposition: Discharge disposition: 01-Home or Self Care       Discharge Instructions    Call MD for:  persistant nausea and vomiting   Complete by: As directed    Call MD for:  severe uncontrolled pain   Complete by: As directed    Call MD for:  temperature  >100.4   Complete by: As directed    Diet - low sodium heart healthy   Complete by: As directed    Driving Restrictions   Complete by: As directed    No driving for 1 week   Increase activity slowly   Complete by: As directed    Leave dressing on - Keep it clean, dry, and intact until clinic visit   Complete by: As directed    Lifting restrictions   Complete by: As directed    Do not lift more than 10 pounds for 6 weeks   Sexual Activity Restrictions   Complete by: As directed    No sex until Dr Elonda Husky releases you     Allergies as of 07/01/2019   No Known Allergies     Medication List    STOP taking these medications   HYDROcodone-acetaminophen 5-325 MG tablet Commonly known as: NORCO/VICODIN   ibuprofen 400 MG tablet Commonly known as: ADVIL     TAKE these medications   ALPRAZolam 1 MG tablet Commonly known as: XANAX Take 1 mg by mouth 3 (three) times daily.   imipramine 25 MG tablet Commonly known as:  TOFRANIL Take 50 mg by mouth at bedtime.   ketorolac 10 MG tablet Commonly known as: TORADOL Take 1 tablet (10 mg total) by mouth every 8 (eight) hours as needed.   ondansetron 8 MG disintegrating tablet Commonly known as: Zofran ODT Take 1 tablet (8 mg total) by mouth every 8 (eight) hours as needed for nausea or vomiting.   oxyCODONE 5 MG immediate release tablet Commonly known as: Oxy IR/ROXICODONE Take 1-2 tablets (5-10 mg total) by mouth every 4 (four) hours as needed for moderate pain.   promethazine 25 MG tablet Commonly known as: PHENERGAN Take 1 tablet (25 mg total) by mouth every 6 (six) hours as needed for nausea.      Follow-up Information    Florian Buff, MD Follow up on 07/09/2019.   Specialties: Obstetrics and Gynecology, Radiology Why: post op Contact information: [place] [address] [phone]           Signed: Florian Buff 07/01/2019, 9:50 AM

## 2019-07-01 NOTE — Progress Notes (Signed)
Patient received discharge instructions and paperwork at bedside. No questions asked  And stated understanding of signs and symptoms to watch for.  Elodia Florence RN 07/01/2019 1003

## 2019-07-08 ENCOUNTER — Telehealth: Payer: Self-pay | Admitting: Obstetrics & Gynecology

## 2019-07-08 NOTE — Telephone Encounter (Signed)

## 2019-07-09 ENCOUNTER — Encounter: Payer: Self-pay | Admitting: Obstetrics & Gynecology

## 2019-07-09 ENCOUNTER — Ambulatory Visit (INDEPENDENT_AMBULATORY_CARE_PROVIDER_SITE_OTHER): Payer: Self-pay | Admitting: Obstetrics & Gynecology

## 2019-07-09 ENCOUNTER — Other Ambulatory Visit: Payer: Self-pay

## 2019-07-09 VITALS — BP 107/76 | HR 98 | Ht 60.0 in | Wt 120.0 lb

## 2019-07-09 DIAGNOSIS — Z9889 Other specified postprocedural states: Secondary | ICD-10-CM

## 2019-07-09 DIAGNOSIS — Z90710 Acquired absence of both cervix and uterus: Secondary | ICD-10-CM

## 2019-07-09 MED ORDER — OXYCODONE HCL 5 MG PO TABS: 5.0000 mg | ORAL_TABLET | ORAL | 0 refills | Status: DC | PRN

## 2019-07-09 MED ORDER — KETOROLAC TROMETHAMINE 10 MG PO TABS: 10.0000 mg | ORAL_TABLET | Freq: Three times a day (TID) | ORAL | 0 refills | Status: DC | PRN

## 2019-07-09 MED ORDER — PROMETHAZINE HCL 25 MG PO TABS: 25.0000 mg | ORAL_TABLET | Freq: Four times a day (QID) | ORAL | 1 refills | Status: DC | PRN

## 2019-07-09 NOTE — Progress Notes (Signed)
  HPI: Patient returns for routine postoperative follow-up having undergone TAH on 06/30/2019.  The patient's immediate postoperative recovery has been unremarkable. Since hospital discharge the patient reports no problems.   Current Outpatient Medications: ALPRAZolam (XANAX) 1 MG tablet, Take 1 mg by mouth 3 (three) times daily. , Disp: , Rfl:  ketorolac (TORADOL) 10 MG tablet, Take 1 tablet (10 mg total) by mouth every 8 (eight) hours as needed., Disp: 15 tablet, Rfl: 0 ondansetron (ZOFRAN ODT) 8 MG disintegrating tablet, Take 1 tablet (8 mg total) by mouth every 8 (eight) hours as needed for nausea or vomiting., Disp: 20 tablet, Rfl: 0 oxyCODONE (OXY IR/ROXICODONE) 5 MG immediate release tablet, Take 1-2 tablets (5-10 mg total) by mouth every 4 (four) hours as needed for moderate pain., Disp: 30 tablet, Rfl: 0 promethazine (PHENERGAN) 25 MG tablet, Take 1 tablet (25 mg total) by mouth every 6 (six) hours as needed for nausea., Disp: 30 tablet, Rfl: 1 imipramine (TOFRANIL) 25 MG tablet, Take 50 mg by mouth at bedtime. , Disp: , Rfl:   No current facility-administered medications for this visit.     Blood pressure 107/76, pulse 98, height 5' (1.524 m), weight 120 lb (54.4 kg).  Physical Exam: Incision clean dry intact Abdomen normal post op  Diagnostic Tests:   Pathology: benign  Impression: S/p TAH  Plan: Routine post op care  Follow up: 4  weeks  Florian Buff, MD

## 2019-07-26 ENCOUNTER — Telehealth: Payer: Self-pay | Admitting: Obstetrics & Gynecology

## 2019-07-26 MED ORDER — HYDROCODONE-ACETAMINOPHEN 5-325 MG PO TABS: 1.0000 | ORAL_TABLET | Freq: Four times a day (QID) | ORAL | 0 refills | Status: DC | PRN

## 2019-07-26 NOTE — Telephone Encounter (Signed)
Pt states her daughter's boxer jumped on her stomach above incision. Right side is puffy. Pt has 4 Tramadol left and some Phenergan. Pt is requesting some pain med, not as strong as Percocet. Pt has an appt scheduled for 9/11. Please advise. Thanks!! [place]

## 2019-07-26 NOTE — Telephone Encounter (Signed)
Patient called, stated that she has just about run out of her nausea medication and would like something as affect as percocet, but not as strong.  She stated that she is such pain when she lays on her side, to the point she doesn't know if she wants to cry or throw up.  She would like to speak to a nurse.  [place]  [phone]

## 2019-07-26 NOTE — Telephone Encounter (Signed)
Filled lortab

## 2019-08-05 ENCOUNTER — Telehealth: Payer: Self-pay | Admitting: Obstetrics & Gynecology

## 2019-08-05 NOTE — Telephone Encounter (Signed)
Called patient regarding appointment scheduled in our office encouraged to come alone to the visit if possible, however, a support person, over age 37, may accompany her  to appointment if assistance is needed for safety or care concerns. Otherwise, support persons should remain outside until the visit is complete.  ° °We ask if you have had any exposure to anyone suspected or confirmed of having COVID-19 or if you are experiencing any of the following, to call and reschedule your appointment: fever, cough, shortness of breath, muscle pain, diarrhea, rash, vomiting, abdominal pain, red eye, weakness, bruising, bleeding, joint pain, or a severe headache.  ° °Please know we will ask you these questions or similar questions when you arrive for your appointment and again it’s how we are keeping everyone safe.   ° °Also,to keep you safe, please use the provided hand sanitizer when you enter the office. We are asking everyone in the office to wear a mask to help prevent the spread of °germs. If you have a mask of your own, please wear it to your appointment, if not, we are happy to provide one for you. ° °Thank you for understanding and your cooperation.  ° ° °[facility] Staff ° ° ° °

## 2019-08-06 ENCOUNTER — Other Ambulatory Visit: Payer: Self-pay

## 2019-08-06 ENCOUNTER — Encounter: Payer: Self-pay | Admitting: Obstetrics & Gynecology

## 2019-08-06 ENCOUNTER — Ambulatory Visit (INDEPENDENT_AMBULATORY_CARE_PROVIDER_SITE_OTHER): Payer: Self-pay | Admitting: Obstetrics & Gynecology

## 2019-08-06 VITALS — BP 106/72 | HR 83 | Ht 60.0 in

## 2019-08-06 DIAGNOSIS — Z90710 Acquired absence of both cervix and uterus: Secondary | ICD-10-CM

## 2019-08-06 DIAGNOSIS — Z9889 Other specified postprocedural states: Secondary | ICD-10-CM

## 2019-08-06 MED ORDER — KETOROLAC TROMETHAMINE 10 MG PO TABS: 10.0000 mg | ORAL_TABLET | Freq: Three times a day (TID) | ORAL | 0 refills | Status: DC | PRN

## 2019-08-06 NOTE — Progress Notes (Signed)
  HPI: Patient returns for routine postoperative follow-up having undergone TAH on 06/30/2019.  The patient's immediate postoperative recovery has been unremarkable. Since hospital discharge the patient reports no problems.   Current Outpatient Medications: ALPRAZolam (XANAX) 1 MG tablet, Take 1 mg by mouth 3 (three) times daily. , Disp: , Rfl:  HYDROcodone-acetaminophen (NORCO/VICODIN) 5-325 MG tablet, Take 1 tablet by mouth every 6 (six) hours as needed. (Patient not taking: Reported on 08/06/2019), Disp: 15 tablet, Rfl: 0 imipramine (TOFRANIL) 25 MG tablet, Take 50 mg by mouth at bedtime. , Disp: , Rfl:  ketorolac (TORADOL) 10 MG tablet, Take 1 tablet (10 mg total) by mouth every 8 (eight) hours as needed. (Patient not taking: Reported on 08/06/2019), Disp: 15 tablet, Rfl: 0 ketorolac (TORADOL) 10 MG tablet, Take 1 tablet (10 mg total) by mouth every 8 (eight) hours as needed., Disp: 15 tablet, Rfl: 0 ondansetron (ZOFRAN ODT) 8 MG disintegrating tablet, Take 1 tablet (8 mg total) by mouth every 8 (eight) hours as needed for nausea or vomiting. (Patient not taking: Reported on 08/06/2019), Disp: 20 tablet, Rfl: 0 oxyCODONE (OXY IR/ROXICODONE) 5 MG immediate release tablet, Take 1-2 tablets (5-10 mg total) by mouth every 4 (four) hours as needed for moderate pain. (Patient not taking: Reported on 08/06/2019), Disp: 24 tablet, Rfl: 0 promethazine (PHENERGAN) 25 MG tablet, Take 1 tablet (25 mg total) by mouth every 6 (six) hours as needed for nausea. (Patient not taking: Reported on 08/06/2019), Disp: 30 tablet, Rfl: 1  No current facility-administered medications for this visit.     Blood pressure 106/72, pulse 83, height 5' (1.524 m).  Physical Exam: Incision clean dry intact Cuff healing well  Diagnostic Tests:   Pathology: benign  Impression: S/P abdominal hysterectomy  Plan: No sex for 4 weeks   Follow up: 1  years  Florian Buff, MD

## 2019-08-10 ENCOUNTER — Telehealth: Payer: Self-pay | Admitting: *Deleted

## 2019-08-10 NOTE — Telephone Encounter (Signed)
Called wanting to know if she can take a bath and/or get in a hot tub.

## 2019-08-11 NOTE — Telephone Encounter (Signed)
Pt advised can get in the bath tub but I wouldn't advise getting in a hot tub. Pt voiced understanding. [place]

## 2019-09-03 ENCOUNTER — Telehealth: Payer: Self-pay | Admitting: Obstetrics & Gynecology

## 2019-09-03 NOTE — Telephone Encounter (Signed)

## 2019-09-06 ENCOUNTER — Ambulatory Visit (INDEPENDENT_AMBULATORY_CARE_PROVIDER_SITE_OTHER): Payer: Medicaid Other | Admitting: Obstetrics & Gynecology

## 2019-09-06 ENCOUNTER — Encounter: Payer: Self-pay | Admitting: Obstetrics & Gynecology

## 2019-09-06 ENCOUNTER — Other Ambulatory Visit: Payer: Self-pay

## 2019-09-06 VITALS — Ht 60.0 in | Wt 119.0 lb

## 2019-09-06 DIAGNOSIS — B9689 Other specified bacterial agents as the cause of diseases classified elsewhere: Secondary | ICD-10-CM | POA: Diagnosis not present

## 2019-09-06 DIAGNOSIS — N760 Acute vaginitis: Secondary | ICD-10-CM

## 2019-09-06 MED ORDER — METRONIDAZOLE 0.75 % VA GEL: VAGINAL | 0 refills | Status: DC

## 2019-09-06 NOTE — Progress Notes (Signed)
       Chief Complaint  Patient presents with  . Follow-up    on hyst    Height 5' (1.524 m), weight 119 lb (54 kg), last menstrual period 05/28/2019.  38 y.o. [id number] Patient's last menstrual period was 05/28/2019 (within days). The current method of family planning is status post hysterectomy.  Subjective Vaginal discharge for 1weeks Itching no Irritation yes Odor yes Similar to previous yes  Previous treatment metronidazole  Objective Vulva:  normal appearing vulva with no masses, tenderness or lesions Vagina:  normal mucosa, thin grey discharge Cervix:  absent Uterus:  uterus absent Adnexa: ovaries:present,       Pertinent ROS No burning with urination, frequency or urgency No nausea, vomiting or diarrhea Nor fever chills or other constitutional symptoms   Labs or studies Wet Prep:   A sample of vaginal discharge was obtained from the posterior fornix using a cotton swab. 2 drops of saline were placed on a slide and the cotton swab was immersed in the saline. Microscopic evaluation was performed and results were as follows:  Negative  for yeast  Positive for clue cells , consistent with Bacterial vaginosis Negative for trichomonas  Normal WBC population   Whiff test: Positive     Impression Diagnoses this Encounter::   ICD-10-CM   1. BV (bacterial vaginosis)  N76.0    B96.89    metrogel x 5 night    Established relevant diagnosis(es):   Plan/Recommendations: Meds ordered this encounter  Medications  . metroNIDAZOLE (METROGEL VAGINAL) 0.75 % vaginal gel    Sig: Nightly x 5 nights    Dispense:  70 g    Refill:  0    Labs or Scans Ordered: No orders of the defined types were placed in this encounter.   Management:: >per above  Follow up Return if symptoms worsen or fail to improve.      All questions were answered.

## 2019-09-26 DIAGNOSIS — R569 Unspecified convulsions: Secondary | ICD-10-CM

## 2019-09-26 HISTORY — DX: Unspecified convulsions: R56.9

## 2019-11-15 DIAGNOSIS — M25562 Pain in left knee: Secondary | ICD-10-CM | POA: Diagnosis not present

## 2019-11-15 DIAGNOSIS — G8929 Other chronic pain: Secondary | ICD-10-CM | POA: Diagnosis not present

## 2019-11-15 DIAGNOSIS — M25372 Other instability, left ankle: Secondary | ICD-10-CM | POA: Diagnosis not present

## 2019-11-15 DIAGNOSIS — M25561 Pain in right knee: Secondary | ICD-10-CM | POA: Diagnosis not present

## 2019-11-15 DIAGNOSIS — M25862 Other specified joint disorders, left knee: Secondary | ICD-10-CM | POA: Diagnosis not present

## 2019-11-15 DIAGNOSIS — M25861 Other specified joint disorders, right knee: Secondary | ICD-10-CM | POA: Diagnosis not present

## 2019-11-22 ENCOUNTER — Other Ambulatory Visit: Payer: Self-pay | Admitting: Student

## 2019-11-22 DIAGNOSIS — R29898 Other symptoms and signs involving the musculoskeletal system: Secondary | ICD-10-CM

## 2019-11-22 DIAGNOSIS — M25562 Pain in left knee: Secondary | ICD-10-CM

## 2019-11-22 DIAGNOSIS — M25561 Pain in right knee: Secondary | ICD-10-CM

## 2019-11-22 DIAGNOSIS — M25361 Other instability, right knee: Secondary | ICD-10-CM

## 2019-11-22 DIAGNOSIS — M25362 Other instability, left knee: Secondary | ICD-10-CM

## 2019-11-22 DIAGNOSIS — M25861 Other specified joint disorders, right knee: Secondary | ICD-10-CM

## 2019-11-30 DIAGNOSIS — M25372 Other instability, left ankle: Secondary | ICD-10-CM | POA: Diagnosis not present

## 2019-12-17 ENCOUNTER — Ambulatory Visit: Payer: Medicaid Other

## 2019-12-23 ENCOUNTER — Ambulatory Visit: Admission: RE | Admit: 2019-12-23 | Payer: Medicaid Other | Source: Ambulatory Visit

## 2020-05-26 DIAGNOSIS — F411 Generalized anxiety disorder: Secondary | ICD-10-CM | POA: Diagnosis not present

## 2020-05-26 DIAGNOSIS — Z1389 Encounter for screening for other disorder: Secondary | ICD-10-CM | POA: Diagnosis not present

## 2020-05-26 DIAGNOSIS — R61 Generalized hyperhidrosis: Secondary | ICD-10-CM | POA: Diagnosis not present

## 2020-09-01 DIAGNOSIS — Z1389 Encounter for screening for other disorder: Secondary | ICD-10-CM | POA: Diagnosis not present

## 2020-09-01 DIAGNOSIS — F411 Generalized anxiety disorder: Secondary | ICD-10-CM | POA: Diagnosis not present

## 2020-09-01 DIAGNOSIS — F4001 Agoraphobia with panic disorder: Secondary | ICD-10-CM | POA: Diagnosis not present

## 2020-10-25 DIAGNOSIS — F411 Generalized anxiety disorder: Secondary | ICD-10-CM | POA: Diagnosis not present

## 2020-10-25 DIAGNOSIS — F410 Panic disorder [episodic paroxysmal anxiety] without agoraphobia: Secondary | ICD-10-CM | POA: Diagnosis not present

## 2020-11-29 DIAGNOSIS — F410 Panic disorder [episodic paroxysmal anxiety] without agoraphobia: Secondary | ICD-10-CM | POA: Diagnosis not present

## 2020-11-29 DIAGNOSIS — F411 Generalized anxiety disorder: Secondary | ICD-10-CM | POA: Diagnosis not present

## 2021-01-23 DIAGNOSIS — L03019 Cellulitis of unspecified finger: Secondary | ICD-10-CM | POA: Diagnosis not present

## 2021-01-23 DIAGNOSIS — F411 Generalized anxiety disorder: Secondary | ICD-10-CM | POA: Diagnosis not present

## 2021-01-23 DIAGNOSIS — F4001 Agoraphobia with panic disorder: Secondary | ICD-10-CM | POA: Diagnosis not present

## 2021-01-23 DIAGNOSIS — Z1389 Encounter for screening for other disorder: Secondary | ICD-10-CM | POA: Diagnosis not present

## 2021-03-28 DIAGNOSIS — L0231 Cutaneous abscess of buttock: Secondary | ICD-10-CM | POA: Diagnosis not present

## 2021-06-07 DIAGNOSIS — R238 Other skin changes: Secondary | ICD-10-CM | POA: Diagnosis not present

## 2021-08-16 DIAGNOSIS — M25572 Pain in left ankle and joints of left foot: Secondary | ICD-10-CM | POA: Diagnosis not present

## 2021-08-16 DIAGNOSIS — F411 Generalized anxiety disorder: Secondary | ICD-10-CM | POA: Diagnosis not present

## 2021-08-16 DIAGNOSIS — Z0000 Encounter for general adult medical examination without abnormal findings: Secondary | ICD-10-CM | POA: Diagnosis not present

## 2021-10-15 DIAGNOSIS — M25512 Pain in left shoulder: Secondary | ICD-10-CM | POA: Diagnosis not present

## 2021-10-15 DIAGNOSIS — M25572 Pain in left ankle and joints of left foot: Secondary | ICD-10-CM | POA: Diagnosis not present

## 2021-10-15 DIAGNOSIS — Z0000 Encounter for general adult medical examination without abnormal findings: Secondary | ICD-10-CM | POA: Diagnosis not present

## 2021-10-15 DIAGNOSIS — M25562 Pain in left knee: Secondary | ICD-10-CM | POA: Diagnosis not present

## 2021-12-20 ENCOUNTER — Telehealth: Payer: Self-pay | Admitting: Family Medicine

## 2021-12-20 NOTE — Telephone Encounter (Signed)
..  Patient declines further follow up and engagement by the Managed Medicaid Team. Appropriate care team members and provider have been notified via electronic communication. The Managed Medicaid Team is available to follow up with the patient after provider conversation with the patient regarding recommendation for engagement and subsequent re-referral to the Managed Medicaid Team.    Jennifer Alley Care Guide, High Risk Medicaid Managed Care Embedded Care Coordination [place]  [facility]   

## 2021-12-24 DIAGNOSIS — R002 Palpitations: Secondary | ICD-10-CM | POA: Diagnosis not present

## 2021-12-24 DIAGNOSIS — F445 Conversion disorder with seizures or convulsions: Secondary | ICD-10-CM | POA: Diagnosis not present

## 2021-12-24 DIAGNOSIS — Z0000 Encounter for general adult medical examination without abnormal findings: Secondary | ICD-10-CM | POA: Diagnosis not present

## 2021-12-24 DIAGNOSIS — R42 Dizziness and giddiness: Secondary | ICD-10-CM | POA: Diagnosis not present

## 2022-01-08 DIAGNOSIS — M25512 Pain in left shoulder: Secondary | ICD-10-CM | POA: Diagnosis not present

## 2022-01-08 DIAGNOSIS — M25562 Pain in left knee: Secondary | ICD-10-CM | POA: Diagnosis not present

## 2022-01-08 DIAGNOSIS — M25869 Other specified joint disorders, unspecified knee: Secondary | ICD-10-CM | POA: Diagnosis not present

## 2022-01-08 DIAGNOSIS — M25862 Other specified joint disorders, left knee: Secondary | ICD-10-CM | POA: Diagnosis not present

## 2022-01-08 DIAGNOSIS — S46912A Strain of unspecified muscle, fascia and tendon at shoulder and upper arm level, left arm, initial encounter: Secondary | ICD-10-CM | POA: Diagnosis not present

## 2022-01-14 ENCOUNTER — Other Ambulatory Visit (HOSPITAL_COMMUNITY): Payer: Self-pay | Admitting: Orthopedic Surgery

## 2022-01-14 ENCOUNTER — Other Ambulatory Visit: Payer: Self-pay | Admitting: Orthopedic Surgery

## 2022-01-14 DIAGNOSIS — M25869 Other specified joint disorders, unspecified knee: Secondary | ICD-10-CM

## 2022-01-23 ENCOUNTER — Ambulatory Visit
Admission: RE | Admit: 2022-01-23 | Discharge: 2022-01-23 | Disposition: A | Discharge status: Home / Self Care | Payer: Medicaid Other | Source: Ambulatory Visit | Attending: Orthopedic Surgery | Admitting: Orthopedic Surgery

## 2022-01-23 DIAGNOSIS — M25869 Other specified joint disorders, unspecified knee: Secondary | ICD-10-CM

## 2022-02-05 ENCOUNTER — Other Ambulatory Visit: Payer: Self-pay | Admitting: Orthopedic Surgery

## 2022-02-05 DIAGNOSIS — M25569 Pain in unspecified knee: Secondary | ICD-10-CM

## 2022-02-14 DIAGNOSIS — H15002 Unspecified scleritis, left eye: Secondary | ICD-10-CM | POA: Diagnosis not present

## 2022-02-14 DIAGNOSIS — Z0000 Encounter for general adult medical examination without abnormal findings: Secondary | ICD-10-CM | POA: Diagnosis not present

## 2022-02-14 DIAGNOSIS — Z1389 Encounter for screening for other disorder: Secondary | ICD-10-CM | POA: Diagnosis not present

## 2022-02-20 ENCOUNTER — Ambulatory Visit
Admission: RE | Admit: 2022-02-20 | Discharge: 2022-02-20 | Disposition: A | Discharge status: Home / Self Care | Payer: Medicaid Other | Source: Ambulatory Visit | Attending: Orthopedic Surgery | Admitting: Orthopedic Surgery

## 2022-02-20 ENCOUNTER — Ambulatory Visit
Admission: RE | Admit: 2022-02-20 | Discharge: 2022-02-20 | Disposition: A | Discharge status: Home / Self Care | Payer: Medicaid Other | Attending: Orthopedic Surgery | Admitting: Orthopedic Surgery

## 2022-02-20 DIAGNOSIS — M25569 Pain in unspecified knee: Secondary | ICD-10-CM | POA: Insufficient documentation

## 2022-04-16 IMAGING — CR DG BONE LENGTH
1 series · 4 of 4 positions shown · non-contrast
Comparison: Right knee radiographs 04/03/2018.

CLINICAL DATA: Chronic bilateral knee pain. Evaluate mechanical
alignment for possible surgery.

EXAM:
BONE LENGTH

[Series 1: long bone ap · 0.14mm/px · 4 of 4 slices shown]
[im 1/4]
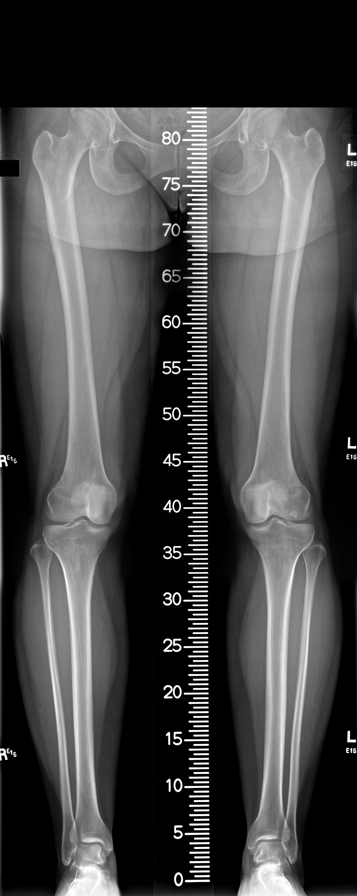
[im 2/4]
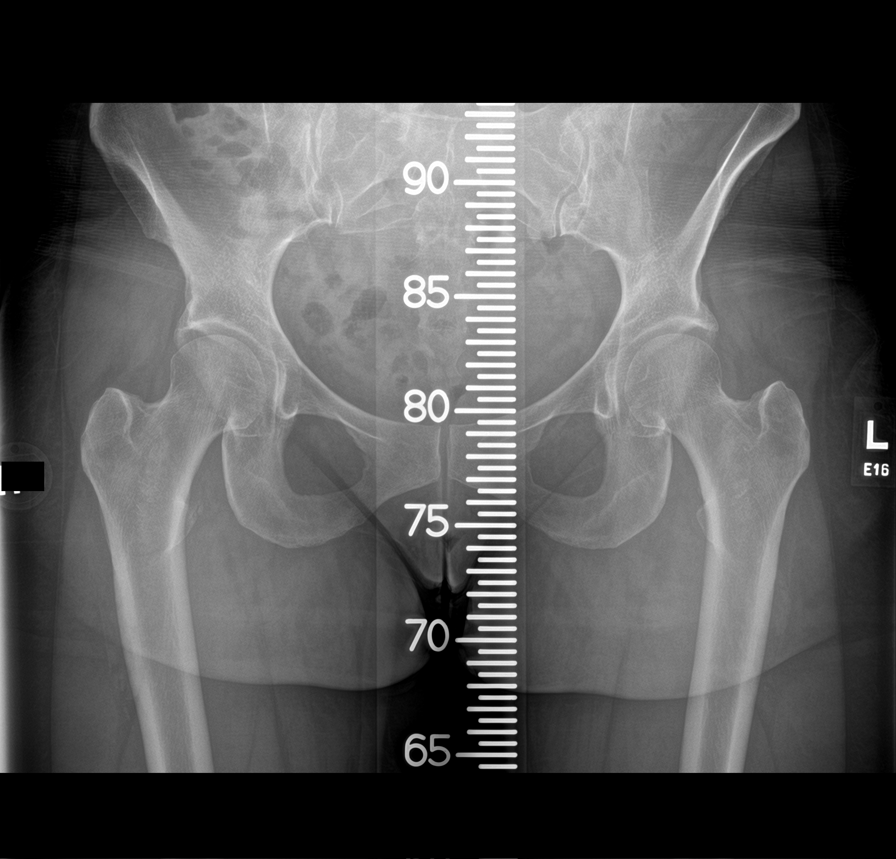
[im 3/4]
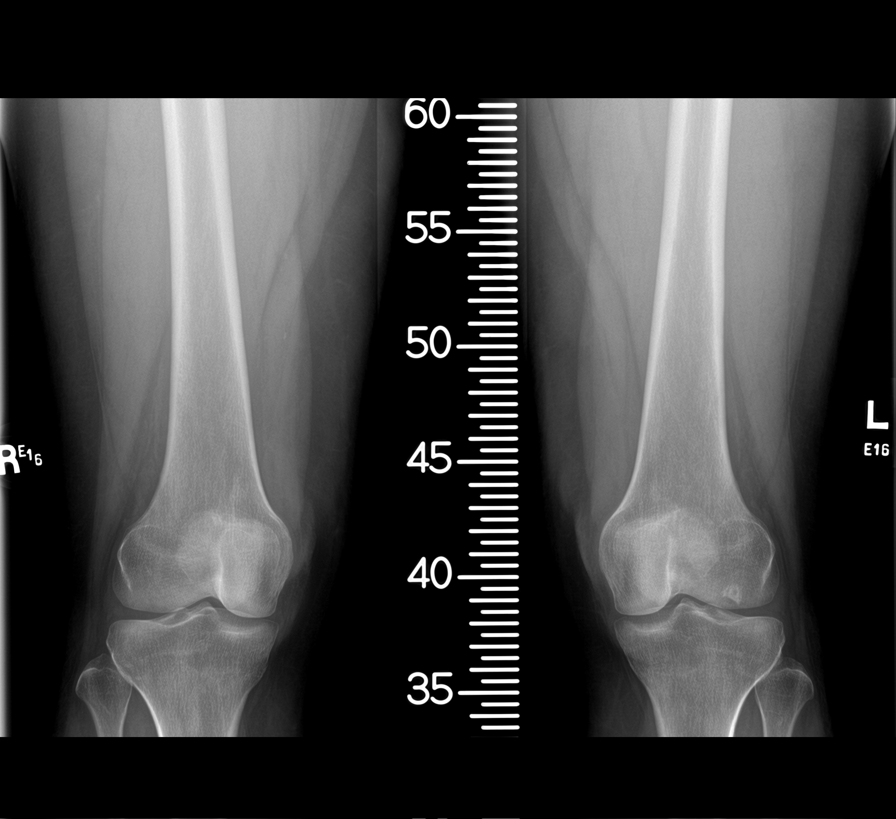
[im 4/4]
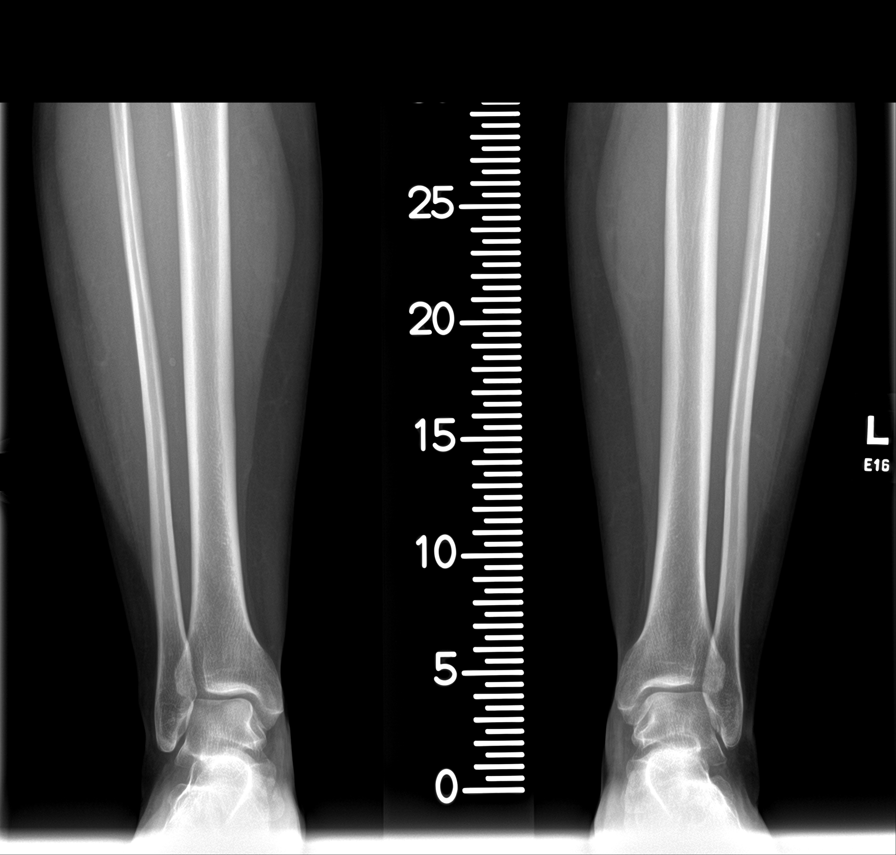

[4 of 4 positions shown; findings below may reference images not displayed]

FINDINGS: The weight-bearing axis from the superior aspect of the femoral
heads through the mid aspect of the distal tibial plafond extend
slightly medial to the medial left tibial spine and minimally medial
to the right tibial spine, minimal varus angulation.

Mild bilateral medial compartment joint space narrowing.

The superior aspect of the femoral head to the top of the talar
dome, the right leg measures approximately 79.3 cm in the left leg
measures approximately 79.4 cm.
IMPRESSION: :
IMPRESSION: 1. Symmetric bilateral leg lengths.
2. Minimal left-greater-than-right genu varum.

## 2022-05-01 DIAGNOSIS — L03039 Cellulitis of unspecified toe: Secondary | ICD-10-CM | POA: Diagnosis not present

## 2022-05-01 DIAGNOSIS — Z1389 Encounter for screening for other disorder: Secondary | ICD-10-CM | POA: Diagnosis not present

## 2022-05-01 DIAGNOSIS — B999 Unspecified infectious disease: Secondary | ICD-10-CM | POA: Diagnosis not present

## 2022-12-25 ENCOUNTER — Emergency Department (HOSPITAL_COMMUNITY): Payer: Medicaid Other

## 2022-12-25 ENCOUNTER — Emergency Department (HOSPITAL_COMMUNITY)
Admission: EM | Admit: 2022-12-25 | Discharge: 2022-12-25 | Disposition: A | Discharge status: Home / Self Care | Payer: Medicaid Other | Attending: Emergency Medicine | Admitting: Emergency Medicine

## 2022-12-25 ENCOUNTER — Encounter (HOSPITAL_COMMUNITY): Payer: Self-pay

## 2022-12-25 ENCOUNTER — Other Ambulatory Visit: Payer: Self-pay

## 2022-12-25 DIAGNOSIS — Q430 Meckel's diverticulum (displaced) (hypertrophic): Secondary | ICD-10-CM | POA: Diagnosis not present

## 2022-12-25 DIAGNOSIS — Z7982 Long term (current) use of aspirin: Secondary | ICD-10-CM | POA: Insufficient documentation

## 2022-12-25 DIAGNOSIS — R1031 Right lower quadrant pain: Secondary | ICD-10-CM

## 2022-12-25 DIAGNOSIS — K5792 Diverticulitis of intestine, part unspecified, without perforation or abscess without bleeding: Secondary | ICD-10-CM

## 2022-12-25 LAB — CBC
HCT: 39.4 % (ref 36.0–46.0)
Hemoglobin: 12.8 g/dL (ref 12.0–15.0)
MCH: 30.2 pg (ref 26.0–34.0)
MCHC: 32.5 g/dL (ref 30.0–36.0)
MCV: 92.9 fL (ref 80.0–100.0)
Platelets: 264 10*3/uL (ref 150–400)
RBC: 4.24 MIL/uL (ref 3.87–5.11)
RDW: 12.5 % (ref 11.5–15.5)
WBC: 6.5 10*3/uL (ref 4.0–10.5)
nRBC: 0 % (ref 0.0–0.2)

## 2022-12-25 LAB — URINALYSIS, ROUTINE W REFLEX MICROSCOPIC
Bilirubin Urine: NEGATIVE
Glucose, UA: NEGATIVE mg/dL
Hgb urine dipstick: NEGATIVE
Ketones, ur: NEGATIVE mg/dL
Leukocytes,Ua: NEGATIVE
Nitrite: NEGATIVE
Protein, ur: NEGATIVE mg/dL
Specific Gravity, Urine: 1.02 (ref 1.005–1.030)
pH: 7 (ref 5.0–8.0)

## 2022-12-25 LAB — COMPREHENSIVE METABOLIC PANEL
ALT: 16 U/L (ref 0–44)
AST: 17 U/L (ref 15–41)
Albumin: 3.9 g/dL (ref 3.5–5.0)
Alkaline Phosphatase: 62 U/L (ref 38–126)
Anion gap: 9 (ref 5–15)
BUN: 12 mg/dL (ref 6–20)
CO2: 28 mmol/L (ref 22–32)
Calcium: 9.5 mg/dL (ref 8.9–10.3)
Chloride: 101 mmol/L (ref 98–111)
Creatinine, Ser: 0.59 mg/dL (ref 0.44–1.00)
GFR, Estimated: >60 mL/min (ref >60)
Glucose, Bld: 74 mg/dL (ref 70–99)
Potassium: 3.7 mmol/L (ref 3.5–5.1)
Sodium: 138 mmol/L (ref 135–145)
Total Bilirubin: 0.5 mg/dL (ref 0.3–1.2)
Total Protein: 7.4 g/dL (ref 6.5–8.1)

## 2022-12-25 LAB — POC URINE PREG, ED: Preg Test, Ur: NEGATIVE

## 2022-12-25 LAB — LIPASE, BLOOD: Lipase: 54 U/L — ABNORMAL HIGH (ref 11–51)

## 2022-12-25 MED ORDER — AMOXICILLIN-POT CLAVULANATE 875-125 MG PO TABS
1.0000 | ORAL_TABLET | Freq: Once | ORAL | Status: AC
  Administered 2022-12-25: 1 via ORAL
  Filled 2022-12-25: qty 1

## 2022-12-25 MED ORDER — FLUCONAZOLE 150 MG PO TABS: 150.0000 mg | ORAL_TABLET | Freq: Once | ORAL | 1 refills | Status: AC

## 2022-12-25 MED ORDER — AMOXICILLIN-POT CLAVULANATE 875-125 MG PO TABS: 1.0000 | ORAL_TABLET | Freq: Two times a day (BID) | ORAL | 0 refills | Status: DC

## 2022-12-25 MED ORDER — IOHEXOL 300 MG/ML  SOLN
100.0000 mL | Freq: Once | INTRAMUSCULAR | Status: AC | PRN
  Administered 2022-12-25: 100 mL via INTRAVENOUS

## 2022-12-25 NOTE — ED Notes (Signed)
Pt states when the MD or provider steps in to talk to her about any results she would like her visitor to be asked to step out as she only wants her medical information/labs/etc discussed with her

## 2022-12-25 NOTE — ED Provider Notes (Addendum)
[facility] Provider Note   CSN: [id number] Arrival date & time: 12/25/22  1244     History  Chief Complaint  Patient presents with   Abdominal Pain    Kelsey Walton is a 42 y.o. female.  Patient does complain of right lower quadrant abdominal pain also did have some right flank pain preceding that.  Everything started on Saturday.  Denies any diarrhea constipation nausea vomiting denies any fevers.  Denies any dysuria.  Patient's had an abdominal hysterectomy.        Home Medications Prior to Admission medications   Medication Sig Start Date End Date Taking? Authorizing Provider  ALPRAZolam (XANAX XR) 3 MG 24 hr tablet Take 3 mg by mouth daily.   Yes [provider]  ALPRAZolam Duanne Moron) 1 MG tablet Take 1 mg by mouth See admin instructions. Take between 3-6am 02/08/22  Yes [provider]  amoxicillin-clavulanate (AUGMENTIN) 875-125 MG tablet Take 1 tablet by mouth every 12 (twelve) hours. 12/25/22  Yes Fredia Sorrow, MD  Aspirin-Salicylamide-Caffeine [facility] HEADACHE POWDER PO) Take 1 packet by mouth as needed (pain).   Yes [provider]  cyclobenzaprine (FLEXERIL) 5 MG tablet Take 5 mg by mouth 3 (three) times daily as needed for muscle spasms.   Yes [provider]  ibuprofen (ADVIL) 200 MG tablet Take 800 mg by mouth every 6 (six) hours as needed for headache.   Yes [provider]      Allergies    Patient has no known allergies.    Review of Systems   Review of Systems  Constitutional:  Negative for chills and fever.  HENT:  Negative for ear pain and sore throat.   Eyes:  Negative for pain and visual disturbance.  Respiratory:  Negative for cough and shortness of breath.   Cardiovascular:  Negative for chest pain and palpitations.  Gastrointestinal:  Positive for abdominal pain. Negative for vomiting.  Genitourinary:  Positive for flank pain. Negative for dysuria and  hematuria.  Musculoskeletal:  Negative for arthralgias and back pain.  Skin:  Negative for color change and rash.  Neurological:  Negative for seizures and syncope.  All other systems reviewed and are negative.   Physical Exam Updated Vital Signs BP 119/71 (BP Location: Right Arm)   Pulse 79   Temp 98.3 F (36.8 C) (Oral)   Resp 17   Ht 1.524 m (5')   Wt 59.8 kg   LMP 05/28/2019 (Within Days)   SpO2 97%   BMI 25.76 kg/m  Physical Exam Vitals and nursing note reviewed.  Constitutional:      General: She is not in acute distress.    Appearance: She is well-developed.  HENT:     Head: Normocephalic and atraumatic.  Eyes:     Conjunctiva/sclera: Conjunctivae normal.  Cardiovascular:     Rate and Rhythm: Normal rate and regular rhythm.     Heart sounds: No murmur heard. Pulmonary:     Effort: Pulmonary effort is normal. No respiratory distress.     Breath sounds: Normal breath sounds.  Abdominal:     General: Abdomen is flat.     Palpations: Abdomen is soft.     Tenderness: There is abdominal tenderness in the right lower quadrant.     Hernia: No hernia is present.  Musculoskeletal:        General: No swelling.     Cervical back: Neck supple.  Skin:    General: Skin is warm  and dry.     Capillary Refill: Capillary refill takes less than 2 seconds.  Neurological:     General: No focal deficit present.     Mental Status: She is alert and oriented to person, place, and time.  Psychiatric:        Mood and Affect: Mood normal.     ED Results / Procedures / Treatments   Labs (all labs ordered are listed, but only abnormal results are displayed) Labs Reviewed  LIPASE, BLOOD - Abnormal; Notable for the following components:      Result Value   Lipase 54 (*)    All other components within normal limits  POC URINE PREG, ED - Normal  COMPREHENSIVE METABOLIC PANEL  CBC  URINALYSIS, ROUTINE W REFLEX MICROSCOPIC    EKG None  Radiology CT Abdomen Pelvis W  Contrast  Result Date: 12/25/2022 CLINICAL DATA:  Right lower quadrant abdominal pain for 2 days. EXAM: CT ABDOMEN AND PELVIS WITH CONTRAST TECHNIQUE: Multidetector CT imaging of the abdomen and pelvis was performed using the standard protocol following bolus administration of intravenous contrast. RADIATION DOSE REDUCTION: This exam was performed according to the departmental dose-optimization program which includes automated exposure control, adjustment of the mA and/or kV according to patient size and/or use of iterative reconstruction technique. CONTRAST:  141m OMNIPAQUE IOHEXOL 300 MG/ML  SOLN COMPARISON:  CT scan from 2020 FINDINGS: Lower chest: The lung bases are clear of acute process. No pleural effusion or pulmonary lesions. The heart is normal in size. No pericardial effusion. The distal esophagus and aorta are unremarkable. Hepatobiliary: No hepatic lesions or intrahepatic biliary dilatation. The gallbladder is unremarkable. No common bile duct dilatation. Pancreas: No mass, inflammation or ductal dilatation. Spleen: Normal size.  No focal lesions. Adrenals/Urinary Tract: Adrenal glands and kidneys are unremarkable. No renal lesions, renal calculi or hydronephrosis. The bladder is unremarkable. Stomach/Bowel: The stomach, duodenum and small bowel are unremarkable. The terminal ileum and appendix are normal. There appears to be a blind-ending cystic structure closely associated with the distal ileum. I believe this was also present on the prior CT scan has not really changed. It could be an ileal diverticulum but a Meckel's diverticulum would be a strong possibility. I do not see any evidence of acute inflammation but a nuclear medicine Meckel's scan may be helpful for further evaluation. Focus of acute sigmoid colon diverticulitis is noted without complicating features. No free air or abscess. Moderate sigmoid colon diverticulosis. The remainder of the colon is unremarkable. The appendix is normal.  Vascular/Lymphatic: The aorta is normal in caliber. No dissection. Age advanced atherosclerotic calcifications. The branch vessels are patent. The major venous structures are patent. No mesenteric or retroperitoneal mass or adenopathy. Small scattered lymph nodes are noted. Reproductive: Small simple cysts associated with the left ovary. The largest measures 2 cm. No further imaging involve UA shin or follow-up is necessary. The right ovary is normal. The uterus is surgically absent. Other: No pelvic mass or adenopathy. No free pelvic fluid collections. No inguinal mass or adenopathy. No abdominal wall hernia or subcutaneous lesions. Musculoskeletal: No significant bony findings. IMPRESSION: 1. Focus of acute uncomplicated sigmoid colon diverticulitis without complicating features. 2. Blind-ending cystic structure closely associated with the distal ileum. I believe this was also present on the prior CT scan has not really changed. It could be an ileal diverticulum but a Meckel's diverticulum would be a strong possibility. I do not see any evidence of acute inflammation but a nuclear medicine Meckel's scan may  be helpful for further evaluation. 3. Age advanced atherosclerotic calcifications. Electronically Signed   By: Marijo Sanes M.D.   On: 12/25/2022 18:10    Procedures Procedures    Medications Ordered in ED Medications  amoxicillin-clavulanate (AUGMENTIN) 875-125 MG per tablet 1 tablet (has no administration in time range)  iohexol (OMNIPAQUE) 300 MG/ML solution 100 mL (100 mLs Intravenous Contrast Given 12/25/22 1754)    ED Course/ Medical Decision Making/ A&P                             Medical Decision Making Amount and/or Complexity of Data Reviewed Labs: ordered. Radiology: ordered.  Risk Prescription drug management.  Patient with right lower quadrant abdominal tenderness.  Need CT scan to rule out acute abdominal process.  Patient's had an abdominal hysterectomy but her  point-of-care urine pregnancy test was negative.  Lipase slightly elevated at 54.  The complete metabolic panel normal.  Include LFTs renal function normal.  Bilirubin not elevated.  CBC no leukocytosis hemoglobin 12.8.  Platelets 264.  CT scan of the abdomen appendix was normal.  Did raise questions of a Meckel's diverticulum in the right lower quadrant.  Discussed with Dr. Arnoldo Morale on-call for general surgery.  Will see her in the office and arrange a Meckel scan.  However he did feel that the diverticulitis definitely needed treatment will treat with Augmentin for the next 7 days.  He felt that this needed to be treated first.  Final Clinical Impression(s) / ED Diagnoses Final diagnoses:  Right lower quadrant abdominal pain  Diverticulitis  Meckel's diverticulum    Rx / DC Orders ED Discharge Orders          Ordered    amoxicillin-clavulanate (AUGMENTIN) 875-125 MG tablet  Every 12 hours        12/25/22 1947              Fredia Sorrow, MD 12/25/22 1711    Fredia Sorrow, MD 12/25/22 1950

## 2022-12-25 NOTE — Discharge Instructions (Addendum)
Take the antibiotic Augmentin for the next 7 days.  Give Dr. Arnoldo Morale office will call tomorrow he will see you in the office and we will probably do a Meckel scan to prove whether there is a Meckel's diverticulum or not.  Either way said that the diverticulitis needs to be treated first.  A prescription for Diflucan provided.  Take it if you develop a yeast infection from the antibiotic.  Also a refill provided if you need to do a redose.  Recommend extra strength Tylenol as needed for the pain.  Can take Motrin with it as well.

## 2022-12-25 NOTE — ED Triage Notes (Signed)
Pt reports RLQ pain since Saturday, denies any diarrhea, constipation, nausea or vomiting.

## 2022-12-31 ENCOUNTER — Ambulatory Visit: Payer: Medicaid Other | Admitting: General Surgery

## 2022-12-31 ENCOUNTER — Encounter: Payer: Self-pay | Admitting: General Surgery

## 2022-12-31 VITALS — BP 111/70 | HR 79 | Temp 98.1°F | Resp 14 | Ht 60.0 in | Wt 132.0 lb

## 2022-12-31 DIAGNOSIS — R1031 Right lower quadrant pain: Secondary | ICD-10-CM | POA: Diagnosis not present

## 2023-01-01 NOTE — Progress Notes (Signed)
Kelsey Walton; [id number]; 1981-01-20   HPI Patient is a 42 year old white female who was referred to my care by Dr. Patsey Berthold in the emergency room for evaluation treatment of right lower quadrant abdominal pain.  Patient was seen in the emergency room on 12/25/2022 and was found to have mild sigmoid diverticulitis along with a structure in the distal ileum that may have been consistent with a Meckel's diverticulum.  Patient states she has had intermittent right lower quadrant abdominal pain for many months.  She denies any fever or chills.  She has been on antibiotics since her discharge from the emergency room and finishes up in 2 days.  Patient denies any diarrhea or blood per rectum. Past Medical History:  Diagnosis Date   Anxiety    Arthritis    Depression    PTSD (post-traumatic stress disorder)    PTSD (post-traumatic stress disorder)    Seizures ([place])     Past Surgical History:  Procedure Laterality Date   ABDOMINAL HYSTERECTOMY N/A 06/30/2019   Procedure: HYSTERECTOMY ABDOMINAL;  Surgeon: Florian Buff, MD;  Location: [facility];  Service: Gynecology;  Laterality: N/A;   AUGMENTATION MAMMAPLASTY Bilateral 2008   saline   BILATERAL SALPINGECTOMY Bilateral 06/30/2019   Procedure: OPEN BILATERAL SALPINGECTOMY;  Surgeon: Florian Buff, MD;  Location: [facility];  Service: Gynecology;  Laterality: Bilateral;   DILATION AND CURETTAGE OF UTERUS     TUBAL LIGATION      Family History  Problem Relation Age of Onset   Breast cancer Maternal Aunt        great aunt   Leukemia Maternal Grandfather    Prostate cancer Maternal Grandfather    Multiple myeloma Father        lung, neck, bone    Current Outpatient Medications on File Prior to Visit  Medication Sig Dispense Refill   ALPRAZolam (XANAX XR) 3 MG 24 hr tablet Take 3 mg by mouth daily.     ALPRAZolam (XANAX) 1 MG tablet Take 1 mg by mouth See admin instructions. Take between 3-3AS     Aspirin-Salicylamide-Caffeine (BC HEADACHE POWDER PO)  Take 1 packet by mouth as needed (pain).     cyclobenzaprine (FLEXERIL) 5 MG tablet Take 5 mg by mouth 3 (three) times daily as needed for muscle spasms.     ibuprofen (ADVIL) 200 MG tablet Take 800 mg by mouth every 6 (six) hours as needed for headache.     No current facility-administered medications on file prior to visit.    No Known Allergies  Social History   Substance and Sexual Activity  Alcohol Use Yes   Comment: 2-3 pints weekly    Social History   Tobacco Use  Smoking Status Former   Types: Cigarettes   Quit date: 12/15/2012   Years since quitting: 10.0  Smokeless Tobacco Never    Review of Systems  Constitutional: Negative.   HENT: Negative.    Eyes: Negative.   Respiratory: Negative.    Cardiovascular: Negative.   Gastrointestinal:  Positive for abdominal pain.  Genitourinary:  Positive for frequency.  Musculoskeletal:  Positive for back pain and joint pain.  Skin: Negative.   Neurological:  Positive for tremors.  Endo/Heme/Allergies: Negative.   Psychiatric/Behavioral:  The patient is nervous/anxious.     Objective   Vitals:   12/31/22 1508  BP: 111/70  Pulse: 79  Resp: 14  Temp: 98.1 F (36.7 C)  SpO2: 97%    Physical Exam Vitals reviewed.  Constitutional:  Appearance: Normal appearance. She is normal weight. She is not ill-appearing.  HENT:     Head: Normocephalic and atraumatic.  Cardiovascular:     Rate and Rhythm: Normal rate and regular rhythm.     Heart sounds: Normal heart sounds. No murmur heard.    No friction rub. No gallop.  Pulmonary:     Effort: Pulmonary effort is normal. No respiratory distress.     Breath sounds: Normal breath sounds. No stridor. No wheezing, rhonchi or rales.  Abdominal:     General: Abdomen is flat. Bowel sounds are normal. There is no distension.     Palpations: Abdomen is soft. There is no mass.     Tenderness: There is abdominal tenderness. There is no guarding or rebound.     Hernia: No  hernia is present.  Skin:    General: Skin is warm and dry.  Neurological:     Mental Status: She is alert and oriented to person, place, and time.   CT scan results reviewed  Assessment  Resolving sigmoid diverticulitis, question Meckel's diverticulum.  Appendix appears normal Plan  Will get Meckel's scan.  Further management is pending those results.  I will call the patient with those results.

## 2023-01-02 ENCOUNTER — Encounter (HOSPITAL_COMMUNITY)
Admission: RE | Admit: 2023-01-02 | Discharge: 2023-01-02 | Disposition: A | Discharge status: Home / Self Care | Payer: Medicaid Other | Source: Ambulatory Visit | Attending: General Surgery | Admitting: General Surgery

## 2023-01-02 ENCOUNTER — Encounter (HOSPITAL_COMMUNITY): Payer: Self-pay

## 2023-01-02 ENCOUNTER — Telehealth (INDEPENDENT_AMBULATORY_CARE_PROVIDER_SITE_OTHER): Payer: Medicaid Other | Admitting: General Surgery

## 2023-01-02 DIAGNOSIS — R1031 Right lower quadrant pain: Secondary | ICD-10-CM | POA: Diagnosis not present

## 2023-01-02 MED ORDER — SODIUM PERTECHNETATE TC 99M INJECTION
10.0000 | Freq: Once | INTRAVENOUS | Status: AC | PRN
  Administered 2023-01-02: 10.2 via INTRAVENOUS

## 2023-01-02 NOTE — Telephone Encounter (Signed)
Contacted patient by phone.  Her Meckel's scan was negative.  CT findings could be consistent with either duplication cyst or ileal diverticulum.  She is finishing her antibiotics.  She does feel better.  I told her to contact me should the lower abdominal pain continue over the next few months.  Follow-up as needed.

## 2023-07-24 ENCOUNTER — Other Ambulatory Visit: Payer: Self-pay | Admitting: Family Medicine

## 2023-07-24 DIAGNOSIS — Z1231 Encounter for screening mammogram for malignant neoplasm of breast: Secondary | ICD-10-CM

## 2023-11-17 ENCOUNTER — Ambulatory Visit
Admission: RE | Admit: 2023-11-17 | Discharge: 2023-11-17 | Disposition: A | Discharge status: Home / Self Care | Payer: Medicaid Other | Source: Ambulatory Visit | Attending: Family Medicine | Admitting: Family Medicine

## 2023-11-17 VITALS — BP 110/75 | HR 67 | Temp 97.5°F | Resp 18

## 2023-11-17 DIAGNOSIS — J22 Unspecified acute lower respiratory infection: Secondary | ICD-10-CM | POA: Diagnosis not present

## 2023-11-17 DIAGNOSIS — J0100 Acute maxillary sinusitis, unspecified: Secondary | ICD-10-CM | POA: Diagnosis not present

## 2023-11-17 MED ORDER — PROMETHAZINE-DM 6.25-15 MG/5ML PO SYRP: 5.0000 mL | ORAL_SOLUTION | Freq: Four times a day (QID) | ORAL | 0 refills | Status: DC | PRN

## 2023-11-17 MED ORDER — ALBUTEROL SULFATE HFA 108 (90 BASE) MCG/ACT IN AERS: 2.0000 | INHALATION_SPRAY | RESPIRATORY_TRACT | 0 refills | Status: DC | PRN

## 2023-11-17 MED ORDER — FLUCONAZOLE 150 MG PO TABS: 150.0000 mg | ORAL_TABLET | ORAL | 0 refills | Status: DC

## 2023-11-17 MED ORDER — AZITHROMYCIN 250 MG PO TABS: ORAL_TABLET | ORAL | 0 refills | Status: DC

## 2023-11-17 NOTE — ED Provider Notes (Signed)
[facility]    CSN: [id number] Arrival date & time: 11/17/23  1229      History   Chief Complaint Chief Complaint  Patient presents with   Cough    HPI Kelsey Walton is a 42 y.o. female.   Patient presenting today with 2-week history of a "barking cough", nasal congestion, ear pressure and popping, sinus pain and pressure, fatigue.  Denies fever, chills, chest pain, abdominal pain, nausea vomiting or diarrhea.  Trying numerous over-the-counter cold and congestion medications with no relief.  No known history of chronic pulmonary disease but is a former smoker and is around secondhand smoke daily.  Concerned about walking pneumonia.   Past Medical History:  Diagnosis Date   Anxiety    Arthritis    Depression    PTSD (post-traumatic stress disorder)    PTSD (post-traumatic stress disorder)    Seizures (HCC)    Patient Active Problem List   Diagnosis Date Noted   S/P hysterectomy 06/30/2019   Breast mass, right 07/17/2015   Past Surgical History:  Procedure Laterality Date   ABDOMINAL HYSTERECTOMY N/A 06/30/2019   Procedure: HYSTERECTOMY ABDOMINAL;  Surgeon: Lazaro Arms, MD;  Location: [facility];  Service: Gynecology;  Laterality: N/A;   AUGMENTATION MAMMAPLASTY Bilateral 2008   saline   BILATERAL SALPINGECTOMY Bilateral 06/30/2019   Procedure: OPEN BILATERAL SALPINGECTOMY;  Surgeon: Lazaro Arms, MD;  Location: [facility];  Service: Gynecology;  Laterality: Bilateral;   DILATION AND CURETTAGE OF UTERUS     TUBAL LIGATION      OB History     Gravida  3   Para  2   Term      Preterm      AB  1   Living  2      SAB      IAB      Ectopic      Multiple      Live Births           Obstetric Comments  Menstrual age:   Age 1st Pregnancy: 10          Home Medications    Prior to Admission medications   Medication Sig Start Date End Date Taking? Authorizing Provider  albuterol (VENTOLIN HFA) 108 (90 Base) MCG/ACT inhaler Inhale 2  puffs into the lungs every 4 (four) hours as needed. 11/17/23  Yes Particia Nearing, PA-C  azithromycin (ZITHROMAX) 250 MG tablet Take first 2 tablets together, then 1 every day until finished. 11/17/23  Yes Particia Nearing, PA-C  fluconazole (DIFLUCAN) 150 MG tablet Take 1 tablet (150 mg total) by mouth once a week. 11/17/23  Yes Particia Nearing, PA-C  promethazine-dextromethorphan (PROMETHAZINE-DM) 6.25-15 MG/5ML syrup Take 5 mLs by mouth 4 (four) times daily as needed. 11/17/23  Yes Particia Nearing, PA-C  ALPRAZolam (XANAX XR) 3 MG 24 hr tablet Take 3 mg by mouth daily.    [provider]  ALPRAZolam Prudy Feeler) 1 MG tablet Take 1 mg by mouth See admin instructions. Take between 3-6am 02/08/22   [provider]  Aspirin-Salicylamide-Caffeine (BC HEADACHE POWDER PO) Take 1 packet by mouth as needed (pain).    [provider]  cyclobenzaprine (FLEXERIL) 5 MG tablet Take 5 mg by mouth 3 (three) times daily as needed for muscle spasms.    [provider]  ibuprofen (ADVIL) 200 MG tablet Take 800 mg by mouth every 6 (six) hours as needed for headache.    [provider]    Family History Family History  Problem Relation Age of Onset   Breast cancer Maternal Aunt        great aunt   Leukemia Maternal Grandfather    Prostate cancer Maternal Grandfather    Multiple myeloma Father        lung, neck, bone    Social History Social History   Tobacco Use   Smoking status: Former    Current packs/day: 0.00    Types: Cigarettes    Quit date: 12/15/2012    Years since quitting: 10.9   Smokeless tobacco: Never  Vaping Use   Vaping status: Never Used  Substance Use Topics   Alcohol use: Yes    Comment: 2-3 pints weekly   Drug use: Yes    Types: Marijuana     Allergies   Patient has no known allergies.   Review of Systems Review of Systems PER HPI  Physical Exam Triage Vital Signs ED Triage Vitals  Encounter Vitals  Group     BP 11/17/23 1311 110/75     Systolic BP Percentile --      Diastolic BP Percentile --      Pulse Rate 11/17/23 1311 67     Resp 11/17/23 1311 18     Temp 11/17/23 1311 (!) 97.5 F (36.4 C)     Temp Source 11/17/23 1311 Oral     SpO2 11/17/23 1311 98 %     Weight --      Height --      Head Circumference --      Peak Flow --      Pain Score 11/17/23 1312 0     Pain Loc --      Pain Education --      Exclude from Growth Chart --    No data found.  Updated Vital Signs BP 110/75 (BP Location: Right Arm)   Pulse 67   Temp (!) 97.5 F (36.4 C) (Oral)   Resp 18   LMP 05/28/2019 (Within Days)   SpO2 98%   Visual Acuity Right Eye Distance:   Left Eye Distance:   Bilateral Distance:    Right Eye Near:   Left Eye Near:    Bilateral Near:     Physical Exam Vitals and nursing note reviewed.  Constitutional:      Appearance: Normal appearance.  HENT:     Head: Atraumatic.     Right Ear: Tympanic membrane and external ear normal.     Left Ear: Tympanic membrane and external ear normal.     Nose: Congestion present.     Mouth/Throat:     Mouth: Mucous membranes are moist.     Pharynx: Posterior oropharyngeal erythema present.  Eyes:     Extraocular Movements: Extraocular movements intact.     Conjunctiva/sclera: Conjunctivae normal.  Cardiovascular:     Rate and Rhythm: Normal rate and regular rhythm.     Heart sounds: Normal heart sounds.  Pulmonary:     Effort: Pulmonary effort is normal.     Breath sounds: Wheezing present. No rales.  Musculoskeletal:        General: Normal range of motion.     Cervical back: Normal range of motion and neck supple.  Skin:    General: Skin is warm and dry.  Neurological:     Mental Status: She is alert and oriented to person, place, and time.  Psychiatric:        Mood and Affect: Mood normal.  Thought Content: Thought content normal.    UC Treatments / Results  Labs (all labs ordered are listed, but only  abnormal results are displayed) Labs Reviewed - No data to display  EKG  Radiology No results found.  Procedures Procedures (including critical care time)  Medications Ordered in UC Medications - No data to display  Initial Impression / Assessment and Plan / UC Course  I have reviewed the triage vital signs and the nursing notes.  Pertinent labs & imaging results that were available during my care of the patient were reviewed by me and considered in my medical decision making (see chart for details).     Vital signs and exam overall reassuring but given duration and worsening course, will cover with Zithromax, Phenergan DM, albuterol and continue over-the-counter supportive medications and home care.  She is requesting a Diflucan as antibiotics and the gave her a yeast infection.  Return for worsening symptoms.  Final Clinical Impressions(s) / UC Diagnoses   Final diagnoses:  Acute non-recurrent maxillary sinusitis  Lower respiratory infection   Discharge Instructions   None    ED Prescriptions     Medication Sig Dispense Auth. Provider   albuterol (VENTOLIN HFA) 108 (90 Base) MCG/ACT inhaler Inhale 2 puffs into the lungs every 4 (four) hours as needed. 18 g Roosvelt Maser [place], [place]   promethazine-dextromethorphan (PROMETHAZINE-DM) 6.25-15 MG/5ML syrup Take 5 mLs by mouth 4 (four) times daily as needed. 100 mL Particia Nearing, PA-C   azithromycin (ZITHROMAX) 250 MG tablet Take first 2 tablets together, then 1 every day until finished. 6 tablet Particia Nearing, [place]   fluconazole (DIFLUCAN) 150 MG tablet Take 1 tablet (150 mg total) by mouth once a week. 2 tablet Particia Nearing, [place]      PDMP not reviewed this encounter.   Particia Nearing, [place] 11/17/23 1441

## 2023-11-17 NOTE — ED Triage Notes (Signed)
Pt reports she has had a "barking cough", nasal congestion, and ears are popping x 2 weeks.  Took OTC meds for relief

## 2023-12-14 ENCOUNTER — Other Ambulatory Visit: Payer: Self-pay | Admitting: Family Medicine

## 2024-05-06 ENCOUNTER — Telehealth: Payer: Self-pay

## 2024-05-06 ENCOUNTER — Ambulatory Visit
Admission: RE | Admit: 2024-05-06 | Discharge: 2024-05-06 | Disposition: A | Discharge status: Home / Self Care | Source: Ambulatory Visit | Attending: Nurse Practitioner | Admitting: Nurse Practitioner

## 2024-05-06 VITALS — BP 115/78 | HR 86 | Temp 97.7°F | Resp 18

## 2024-05-06 DIAGNOSIS — R350 Frequency of micturition: Secondary | ICD-10-CM

## 2024-05-06 DIAGNOSIS — J019 Acute sinusitis, unspecified: Secondary | ICD-10-CM

## 2024-05-06 LAB — POCT URINALYSIS DIP (MANUAL ENTRY)
Bilirubin, UA: NEGATIVE
Blood, UA: NEGATIVE
Glucose, UA: NEGATIVE mg/dL
Ketones, POC UA: NEGATIVE mg/dL
Leukocytes, UA: NEGATIVE
Nitrite, UA: NEGATIVE
Protein Ur, POC: NEGATIVE mg/dL
Spec Grav, UA: 1.02 (ref 1.010–1.025)
Urobilinogen, UA: 0.2 U/dL
pH, UA: 6 (ref 5.0–8.0)

## 2024-05-06 LAB — POC SARS CORONAVIRUS 2 AG -  ED: SARS Coronavirus 2 Ag: NEGATIVE

## 2024-05-06 MED ORDER — FLUCONAZOLE 150 MG PO TABS: 150.0000 mg | ORAL_TABLET | Freq: Every day | ORAL | 0 refills | Status: DC

## 2024-05-06 MED ORDER — FLUTICASONE PROPIONATE 50 MCG/ACT NA SUSP: 2.0000 | Freq: Every day | NASAL | 0 refills | Status: AC

## 2024-05-06 MED ORDER — ALBUTEROL SULFATE HFA 108 (90 BASE) MCG/ACT IN AERS: 2.0000 | INHALATION_SPRAY | Freq: Four times a day (QID) | RESPIRATORY_TRACT | 0 refills | Status: AC | PRN

## 2024-05-06 MED ORDER — AMOXICILLIN-POT CLAVULANATE 875-125 MG PO TABS: 1.0000 | ORAL_TABLET | Freq: Two times a day (BID) | ORAL | 0 refills | Status: DC

## 2024-05-06 NOTE — ED Triage Notes (Signed)
 Nasal congestion and shortness of breath since Saturday.  Has been taking dayquil and tylenol  cold and sinus without relief.  States feels dizzy.  States could not taste anything on  Monday.

## 2024-05-06 NOTE — Telephone Encounter (Signed)
 Pt has requested diflucan  to have in case of yeast infection with antibiotics provider has given okay. Prescription sent to [facility] [address] In [place]

## 2024-05-06 NOTE — ED Provider Notes (Signed)
 [facility]    CSN: [id number] Arrival date & time: 05/06/24  1056      History   Chief Complaint Chief Complaint  Patient presents with   Dizziness    Entered by patient    HPI Kelsey Walton is a 43 y.o. female.   The history is provided by the patient.   Patient presents for complaints of fatigue, nasal congestion, dizziness, loss of taste and smell, and shortness of breath.  Symptoms have been present for the past several days.  She denies fever, chills, headache, ear pain, cough, wheezing, chest pain, abdominal pain, nausea, vomiting, diarrhea, or rash.  States she has been using several over-the-counter medications for her symptoms with minimal relief.  Patient also complains of pain around her kidneys and urinary frequency that started around the same time of her other symptoms.  Past Medical History:  Diagnosis Date   Anxiety    Arthritis    Depression    PTSD (post-traumatic stress disorder)    PTSD (post-traumatic stress disorder)    Seizures (HCC)     Patient Active Problem List   Diagnosis Date Noted   S/P hysterectomy 06/30/2019   Breast mass, right 07/17/2015    Past Surgical History:  Procedure Laterality Date   ABDOMINAL HYSTERECTOMY N/A 06/30/2019   Procedure: HYSTERECTOMY ABDOMINAL;  Surgeon: Wendelyn Halter, MD;  Location: [facility];  Service: Gynecology;  Laterality: N/A;   AUGMENTATION MAMMAPLASTY Bilateral 2008   saline   BILATERAL SALPINGECTOMY Bilateral 06/30/2019   Procedure: OPEN BILATERAL SALPINGECTOMY;  Surgeon: Wendelyn Halter, MD;  Location: [facility];  Service: Gynecology;  Laterality: Bilateral;   DILATION AND CURETTAGE OF UTERUS     TUBAL LIGATION      OB History     Gravida  3   Para  2   Term      Preterm      AB  1   Living  2      SAB      IAB      Ectopic      Multiple      Live Births           Obstetric Comments  Menstrual age:   Age 1st Pregnancy: 89           Home Medications     Prior to Admission medications   Medication Sig Start Date End Date Taking? Authorizing Provider  amoxicillin -clavulanate (AUGMENTIN ) 875-125 MG tablet Take 1 tablet by mouth every 12 (twelve) hours. 05/06/24  Yes Leath-Warren, Belen Bowers, NP  fluticasone (FLONASE) 50 MCG/ACT nasal spray Place 2 sprays into both nostrils daily. 05/06/24  Yes Leath-Warren, Belen Bowers, NP  albuterol  (VENTOLIN  HFA) 108 (90 Base) MCG/ACT inhaler Inhale 2 puffs into the lungs every 4 (four) hours as needed. 11/17/23   Corbin Dess, PA-C  ALPRAZolam  (XANAX  XR) 3 MG 24 hr tablet Take 3 mg by mouth daily.    [provider]  ALPRAZolam  (XANAX ) 1 MG tablet Take 1 mg by mouth See admin instructions. Take between 3-6am 02/08/22   [provider]  Aspirin-Salicylamide-Caffeine (BC HEADACHE POWDER PO) Take 1 packet by mouth as needed (pain).    [provider]  ibuprofen  (ADVIL ) 200 MG tablet Take 800 mg by mouth every 6 (six) hours as needed for headache.    [provider]    Family History Family History  Problem Relation Age of Onset   Breast cancer Maternal Aunt  great aunt   Leukemia Maternal Grandfather    Prostate cancer Maternal Grandfather    Multiple myeloma Father        lung, neck, bone    Social History Social History   Tobacco Use   Smoking status: Former    Current packs/day: 0.00    Types: Cigarettes    Quit date: 12/15/2012    Years since quitting: 11.3   Smokeless tobacco: Never  Vaping Use   Vaping status: Never Used  Substance Use Topics   Alcohol use: Yes    Comment: 2-3 pints weekly   Drug use: Yes    Types: Marijuana     Allergies   Patient has no known allergies.   Review of Systems Review of Systems Per HPI  Physical Exam Triage Vital Signs ED Triage Vitals  Encounter Vitals Group     BP 05/06/24 1104 115/78     Girls Systolic BP Percentile --      Girls Diastolic BP Percentile --      Boys Systolic BP Percentile  --      Boys Diastolic BP Percentile --      Pulse Rate 05/06/24 1104 86     Resp 05/06/24 1104 18     Temp 05/06/24 1104 97.7 F (36.5 C)     Temp Source 05/06/24 1104 Oral     SpO2 05/06/24 1104 97 %     Weight --      Height --      Head Circumference --      Peak Flow --      Pain Score 05/06/24 1105 0     Pain Loc --      Pain Education --      Exclude from Growth Chart --    No data found.  Updated Vital Signs BP 115/78 (BP Location: Right Arm)   Pulse 86   Temp 97.7 F (36.5 C) (Oral)   Resp 18   LMP 05/28/2019 (Within Days)   SpO2 97%   Visual Acuity Right Eye Distance:   Left Eye Distance:   Bilateral Distance:    Right Eye Near:   Left Eye Near:    Bilateral Near:     Physical Exam Vitals and nursing note reviewed.  Constitutional:      General: She is not in acute distress.    Appearance: Normal appearance.  HENT:     Head: Normocephalic.     Right Ear: Tympanic membrane, ear canal and external ear normal.     Left Ear: Tympanic membrane, ear canal and external ear normal.     Nose: Congestion present.     Mouth/Throat:     Mouth: Mucous membranes are moist.   Eyes:     Extraocular Movements: Extraocular movements intact.     Pupils: Pupils are equal, round, and reactive to light.    Cardiovascular:     Rate and Rhythm: Normal rate and regular rhythm.     Pulses: Normal pulses.     Heart sounds: Normal heart sounds.  Pulmonary:     Effort: Pulmonary effort is normal. No respiratory distress.     Breath sounds: Normal breath sounds. No stridor. No wheezing, rhonchi or rales.  Abdominal:     Palpations: Abdomen is soft.     Tenderness: There is no abdominal tenderness. There is no right CVA tenderness or left CVA tenderness.   Musculoskeletal:     Cervical back: Normal range of motion.   Skin:  General: Skin is warm and dry.   Neurological:     General: No focal deficit present.     Mental Status: She is alert and oriented to  person, place, and time.   Psychiatric:        Mood and Affect: Mood normal.        Behavior: Behavior normal.      UC Treatments / Results  Labs (all labs ordered are listed, but only abnormal results are displayed) Labs Reviewed  POCT URINALYSIS DIP (MANUAL ENTRY) - Normal  POC SARS CORONAVIRUS 2 AG -  ED    EKG   Radiology No results found.  Procedures Procedures (including critical care time)  Medications Ordered in UC Medications - No data to display  Initial Impression / Assessment and Plan / UC Course  I have reviewed the triage vital signs and the nursing notes.  Pertinent labs & imaging results that were available during my care of the patient were reviewed by me and considered in my medical decision making (see chart for details).  The COVID test and urinalysis were negative.  Symptoms are consistent with acute sinusitis.  Will treat with Augmentin  875/125 mg tablets twice daily and fluticasone 50 micro nasal spray for nasal congestion and runny nose, and an albuterol  inhaler for shortness of breath.  With regard to the urinary symptoms, the urinalysis did not indicate an obvious infection, advised patient to continue drinking plenty of fluids and recommended follow-up with her PCP if symptoms fail to improve.  Supportive care recommendations were provided and discussed with the patient to include fluids, rest, over-the-counter analgesics, use of normal saline nasal spray, and use of a humidifier at nighttime during sleep.  Patient was given indications regarding follow-up.  Patient was in agreement with this plan of care and verbalizes understanding.  All questions were answered.  Patient stable for discharge.   Final Clinical Impressions(s) / UC Diagnoses   Final diagnoses:  Acute sinusitis, recurrence not specified, unspecified location  Urinary frequency     Discharge Instructions      Your COVID test and urinalysis were negative. Take medication as  prescribed. Increase fluids and allow for plenty of rest.  To drink at least 8-10 8 ounce glasses of water daily. You may take over-the-counter Tylenol  or ibuprofen  as needed for pain, fever, or general discomfort. Recommend the use of normal saline nasal spray throughout the day for nasal congestion and runny nose. You may continue over-the-counter cough medications such as Delsym or Robitussin as needed. If symptoms fail to improve with this treatment, recommend follow-up with your primary care physician for further evaluation. Follow-up as needed.     ED Prescriptions     Medication Sig Dispense Auth. Provider   amoxicillin -clavulanate (AUGMENTIN ) 875-125 MG tablet Take 1 tablet by mouth every 12 (twelve) hours. 14 tablet Leath-Warren, Belen Bowers, NP   fluticasone (FLONASE) 50 MCG/ACT nasal spray Place 2 sprays into both nostrils daily. 16 g Leath-Warren, Belen Bowers, NP      PDMP not reviewed this encounter.   Hardy Lia, NP 05/06/24 1135

## 2024-05-06 NOTE — Discharge Instructions (Addendum)
 Your COVID test and urinalysis were negative. Take medication as prescribed. Increase fluids and allow for plenty of rest.  To drink at least 8-10 8 ounce glasses of water daily. You may take over-the-counter Tylenol  or ibuprofen  as needed for pain, fever, or general discomfort. Recommend the use of normal saline nasal spray throughout the day for nasal congestion and runny nose. You may continue over-the-counter cough medications such as Delsym or Robitussin as needed. If symptoms fail to improve with this treatment, recommend follow-up with your primary care physician for further evaluation. Follow-up as needed.

## 2024-05-26 ENCOUNTER — Ambulatory Visit: Attending: Orthopedic Surgery | Admitting: Occupational Therapy

## 2024-05-26 DIAGNOSIS — M6281 Muscle weakness (generalized): Secondary | ICD-10-CM | POA: Insufficient documentation

## 2024-05-26 DIAGNOSIS — R29898 Other symptoms and signs involving the musculoskeletal system: Secondary | ICD-10-CM | POA: Insufficient documentation

## 2024-05-26 DIAGNOSIS — R278 Other lack of coordination: Secondary | ICD-10-CM | POA: Insufficient documentation

## 2024-05-26 DIAGNOSIS — M79641 Pain in right hand: Secondary | ICD-10-CM | POA: Insufficient documentation

## 2024-05-26 DIAGNOSIS — R29818 Other symptoms and signs involving the nervous system: Secondary | ICD-10-CM | POA: Insufficient documentation

## 2024-05-26 DIAGNOSIS — R208 Other disturbances of skin sensation: Secondary | ICD-10-CM | POA: Insufficient documentation

## 2024-05-29 ENCOUNTER — Emergency Department (HOSPITAL_COMMUNITY)

## 2024-05-29 ENCOUNTER — Emergency Department (HOSPITAL_COMMUNITY)
Admission: EM | Admit: 2024-05-29 | Discharge: 2024-05-30 | Disposition: A | Discharge status: Home / Self Care | Attending: Emergency Medicine | Admitting: Emergency Medicine

## 2024-05-29 ENCOUNTER — Other Ambulatory Visit: Payer: Self-pay

## 2024-05-29 ENCOUNTER — Encounter (HOSPITAL_COMMUNITY): Payer: Self-pay | Admitting: Emergency Medicine

## 2024-05-29 DIAGNOSIS — S52611A Displaced fracture of right ulna styloid process, initial encounter for closed fracture: Secondary | ICD-10-CM | POA: Insufficient documentation

## 2024-05-29 DIAGNOSIS — W11XXXA Fall on and from ladder, initial encounter: Secondary | ICD-10-CM | POA: Insufficient documentation

## 2024-05-29 DIAGNOSIS — S52571A Other intraarticular fracture of lower end of right radius, initial encounter for closed fracture: Secondary | ICD-10-CM | POA: Insufficient documentation

## 2024-05-29 DIAGNOSIS — R519 Headache, unspecified: Secondary | ICD-10-CM | POA: Insufficient documentation

## 2024-05-29 DIAGNOSIS — S52501A Unspecified fracture of the lower end of right radius, initial encounter for closed fracture: Secondary | ICD-10-CM

## 2024-05-29 DIAGNOSIS — M25531 Pain in right wrist: Secondary | ICD-10-CM | POA: Diagnosis present

## 2024-05-29 DIAGNOSIS — Z7982 Long term (current) use of aspirin: Secondary | ICD-10-CM | POA: Insufficient documentation

## 2024-05-29 MED ORDER — ONDANSETRON HCL 4 MG/2ML IJ SOLN
4.0000 mg | Freq: Once | INTRAMUSCULAR | Status: AC
  Administered 2024-05-29: 4 mg via INTRAVENOUS
  Filled 2024-05-29: qty 2

## 2024-05-29 MED ORDER — MORPHINE SULFATE (PF) 4 MG/ML IV SOLN
4.0000 mg | Freq: Once | INTRAVENOUS | Status: AC
  Administered 2024-05-29: 4 mg via INTRAVENOUS
  Filled 2024-05-29: qty 1

## 2024-05-29 MED ORDER — KETOROLAC TROMETHAMINE 15 MG/ML IJ SOLN
15.0000 mg | Freq: Once | INTRAMUSCULAR | Status: AC
  Administered 2024-05-29: 15 mg via INTRAVENOUS
  Filled 2024-05-29: qty 1

## 2024-05-29 MED ORDER — LIDOCAINE HCL (PF) 1 % IJ SOLN
30.0000 mL | Freq: Once | INTRAMUSCULAR | Status: AC
  Administered 2024-05-29: 30 mL
  Filled 2024-05-29: qty 30

## 2024-05-29 MED ORDER — DROPERIDOL 2.5 MG/ML IJ SOLN
1.2500 mg | Freq: Once | INTRAMUSCULAR | Status: AC
  Administered 2024-05-29: 1.25 mg via INTRAVENOUS
  Filled 2024-05-29: qty 2

## 2024-05-29 MED ORDER — KETOROLAC TROMETHAMINE 15 MG/ML IJ SOLN: 15.0000 mg | Freq: Once | INTRAMUSCULAR | Status: DC

## 2024-05-29 NOTE — ED Notes (Signed)
Pt wheeled to the bathroom.

## 2024-05-29 NOTE — ED Provider Notes (Signed)
 [place] [facility] AT [facility] Provider Note   CSN: [id number] Arrival date & time: 05/29/24  2041     Patient presents with: Fall and Arm Injury   Kelsey Walton is a 43 y.o. female.  Presents the ER today due to right wrist pain and deformity after a fall from a 6 foot ladder.  Patient is intoxicated, is not sure if she if he fell or hit the ladder on the way down.  Family at bedside states they note some scratches on her legs that look like she had hit the ladder at some point the way down but did not witness her fall.  She is not sure if she hit her head but denies headache or neck pain, no numbness or tingling, denies chest abdominal or back pain.  {Add pertinent medical, surgical, social history, OB history to YEP:67052}  Fall  Arm Injury      Prior to Admission medications   Medication Sig Start Date End Date Taking? Authorizing Provider  albuterol  (VENTOLIN  HFA) 108 (90 Base) MCG/ACT inhaler Inhale 2 puffs into the lungs every 6 (six) hours as needed. 05/06/24   Leath-Warren, Etta PARAS, NP  ALPRAZolam  (XANAX  XR) 3 MG 24 hr tablet Take 3 mg by mouth daily.    [provider]  ALPRAZolam  (XANAX ) 1 MG tablet Take 1 mg by mouth See admin instructions. Take between 3-6am 02/08/22   [provider]  amoxicillin -clavulanate (AUGMENTIN ) 875-125 MG tablet Take 1 tablet by mouth every 12 (twelve) hours. 05/06/24   Leath-Warren, Etta PARAS, NP  Aspirin-Salicylamide-Caffeine (BC HEADACHE POWDER PO) Take 1 packet by mouth as needed (pain).    [provider]  fluconazole  (DIFLUCAN ) 150 MG tablet Take 1 tablet (150 mg total) by mouth daily. 05/06/24   Leath-Warren, Etta PARAS, NP  fluticasone  (FLONASE ) 50 MCG/ACT nasal spray Place 2 sprays into both nostrils daily. 05/06/24   Leath-Warren, Etta PARAS, NP  ibuprofen  (ADVIL ) 200 MG tablet Take 800 mg by mouth every 6 (six) hours as needed for headache.    [provider]    Allergies:  Patient has no known allergies.    Review of Systems  Updated Vital Signs BP 115/78 (BP Location: Left Arm)   Pulse 87   Temp (!) 97.2 F (36.2 C) (Oral)   Resp 18   Ht 5' (1.524 m)   Wt 62.1 kg   LMP 05/28/2019 (Within Days)   SpO2 98%   BMI 26.76 kg/m   Physical Exam  (all labs ordered are listed, but only abnormal results are displayed) Labs Reviewed - No data to display  EKG: None  Radiology: No results found.  {Document cardiac monitor, telemetry assessment procedure when appropriate:32947} Procedures   Medications Ordered in the ED  ketorolac  (TORADOL ) 15 MG/ML injection 15 mg (has no administration in time range)  ketorolac  (TORADOL ) 15 MG/ML injection 15 mg (has no administration in time range)  morphine  (PF) 4 MG/ML injection 4 mg (has no administration in time range)      {Click here for ABCD2, HEART and other calculators REFRESH Note before signing:1}                              Medical Decision Making Amount and/or Complexity of Data Reviewed Radiology: ordered.  Risk Prescription drug management.   ***  {Document critical care time when appropriate  Document review of labs and clinical decision tools ie CHADS2VASC2,  etc  Document your independent review of radiology images and any outside records  Document your discussion with family members, caretakers and with consultants  Document social determinants of health affecting pt's care  Document your decision making why or why not admission, treatments were needed:32947:::1}   Final diagnoses:  None    ED Discharge Orders     None

## 2024-05-29 NOTE — ED Triage Notes (Signed)
 Pt here after she fell off a 6 ft ladder and injuring R arm. Obvious deformity noted to R forearm. Pt with ETOH on board.

## 2024-05-30 ENCOUNTER — Emergency Department (HOSPITAL_COMMUNITY)

## 2024-05-30 MED ORDER — ONDANSETRON 4 MG PO TBDP
4.0000 mg | ORAL_TABLET | Freq: Four times a day (QID) | ORAL | 0 refills | Status: DC | PRN
Start: 2024-05-30 — End: 2024-06-01

## 2024-05-30 MED ORDER — ACETAMINOPHEN 500 MG PO TABS
1000.0000 mg | ORAL_TABLET | Freq: Once | ORAL | Status: AC
  Administered 2024-05-30: 1000 mg via ORAL
  Filled 2024-05-30: qty 2

## 2024-05-30 MED ORDER — MORPHINE SULFATE (PF) 4 MG/ML IV SOLN
4.0000 mg | Freq: Once | INTRAVENOUS | Status: DC
  Filled 2024-05-30: qty 1

## 2024-05-30 MED ORDER — NALOXONE HCL 4 MG/0.1ML NA LIQD: NASAL | 0 refills | Status: AC

## 2024-05-30 MED ORDER — ONDANSETRON HCL 4 MG/2ML IJ SOLN
4.0000 mg | Freq: Once | INTRAMUSCULAR | Status: DC
  Filled 2024-05-30: qty 2

## 2024-05-30 MED ORDER — KETAMINE HCL 50 MG/5ML IJ SOSY
1.0000 mg/kg | PREFILLED_SYRINGE | Freq: Once | INTRAMUSCULAR | Status: AC
  Administered 2024-05-30: 62 mg via INTRAVENOUS
  Filled 2024-05-30: qty 10

## 2024-05-30 MED ORDER — OXYCODONE HCL 5 MG PO TABS: 5.0000 mg | ORAL_TABLET | Freq: Four times a day (QID) | ORAL | 0 refills | Status: DC | PRN

## 2024-05-30 NOTE — Discharge Instructions (Addendum)
 In the ER today after a fall resulting in a fracture of your wrist.  This was reduced in the ER because it was very displaced.  This is improved postreduction film.  Is very important for you to follow-up closely with a hand specialist.  You can use the sling as needed to help support your arm.  Keep your arm elevated.  Avoid using the arm.  You were prescribed opiate pain medication. This can have many sided effect such as drowsiness, slowed breathing, and conspitation. Do not take it with other medications that cause drowsiness, or drink alcohol while taking it. Take a stool softener over the counter while taking it.  You can also take over-the-counter Tylenol  and ibuprofen  to help with pain as directed on packaging.  As discussed, since you are on Xanax  this does increase your risk of accidental overdose, try to take them at different times, and also you were prescribed Narcan  in case of accidental overdose.

## 2024-05-30 NOTE — ED Provider Notes (Signed)
 .Sedation  Date/Time: 05/30/2024 12:53 AM  Performed by: Franklyn Sid SAILOR, MD Authorized by: Franklyn Sid SAILOR, MD   Consent:    Consent obtained:  Written   Consent given by:  Guardian (patient's adult daughter at bedside)   Risks discussed:  Vomiting, respiratory compromise necessitating ventilatory assistance and intubation, prolonged hypoxia resulting in organ damage, dysrhythmia, allergic reaction, nausea and inadequate sedation   Alternatives discussed:  Analgesia without sedation and regional anesthesia Universal protocol:    Immediately prior to procedure, a time out was called: yes     Patient identity confirmed:  Arm band and verbally with patient Indications:    Procedure performed:  Fracture reduction   Procedure necessitating sedation performed by:  Physician performing sedation Pre-sedation assessment:    Time since last food or drink:  Unknown, many hours   NPO status caution: unable to specify NPO status     ASA classification: class 1 - normal, healthy patient     Mouth opening:  2 finger widths   Thyromental distance:  2 finger widths   Mallampati score:  III - soft palate, base of uvula visible   Neck mobility: normal     Pre-sedation assessments completed and reviewed: pre-procedure airway patency not reviewed, pre-procedure cardiovascular function not reviewed, pre-procedure hydration status not reviewed, pre-procedure mental status not reviewed, pre-procedure nausea and vomiting status not reviewed, pre-procedure pain level not reviewed, pre-procedure respiratory function not reviewed and pre-procedure temperature not reviewed   A pre-sedation assessment was completed prior to the start of the procedure Immediate pre-procedure details:    Reassessment: Patient reassessed immediately prior to procedure     Reviewed: vital signs, relevant labs/tests and NPO status     Verified: bag valve mask available, emergency equipment available, intubation equipment available, IV  patency confirmed, oxygen available and suction available   Procedure details (see MAR for exact dosages):    Preoxygenation:  Room air   Sedation:  Ketamine    Intended level of sedation: deep   Analgesia:  Morphine    Intra-procedure monitoring:  Blood pressure monitoring, frequent LOC assessments, frequent vital sign checks, continuous pulse oximetry, continuous capnometry and cardiac monitor   Intra-procedure events: none     Total Provider sedation time (minutes):  15 Post-procedure details:   A post-sedation assessment was completed following the completion of the procedure.   Attendance: Constant attendance by certified staff until patient recovered     Recovery: Patient returned to pre-procedure baseline     Post-sedation assessments completed and reviewed: post-procedure airway patency not reviewed, post-procedure cardiovascular function not reviewed, post-procedure hydration status not reviewed, post-procedure mental status not reviewed, post-procedure nausea and vomiting status not reviewed, pain score not reviewed, post-procedure respiratory function not reviewed and post-procedure temperature not reviewed     Patient is stable for discharge or admission: yes     Procedure completion:  Tolerated well, no immediate complications .Reduction of fracture  Date/Time: 05/30/2024 12:55 AM  Performed by: Franklyn Sid SAILOR, MD Authorized by: Franklyn Sid SAILOR, MD  Consent: Verbal consent obtained. Written consent obtained Risks and benefits: risks, benefits and alternatives were discussed Consent given by: guardian (patient's adult daughter at bedside) Imaging studies: imaging studies available Required items: required blood products, implants, devices, and special equipment available Patient identity confirmed: arm band Time out: Immediately prior to procedure a time out was called to verify the correct patient, procedure, equipment, support staff and site/side marked as required. Local  anesthesia used: no  Anesthesia: Local anesthesia used: no  Sedation: Patient sedated: yes Sedatives: ketamine  Analgesia: morphine  Vitals: Vital signs were monitored during sedation.  Patient tolerance: patient tolerated the procedure well with no immediate complications       Franklyn Sid SAILOR, MD 05/30/24 [phone]

## 2024-05-30 NOTE — ED Provider Notes (Signed)
  Provider Note MRN:  [id number]  Arrival date & time: 05/30/24    ED Course and Medical Decision Making  Assumed care of patient at sign-out or upon transfer.  Postreduction films showing improved alignment, patient has recovered adequately from sedation.  Appropriate for discharge.  Procedures  Final Clinical Impressions(s) / ED Diagnoses     ICD-10-CM   1. Closed fracture of distal end of right radius, unspecified fracture morphology, initial encounter  S52.501A       ED Discharge Orders          Ordered    oxyCODONE  (ROXICODONE ) 5 MG immediate release tablet  Every 6 hours PRN        05/30/24 0122    naloxone  (NARCAN ) nasal spray 4 mg/0.1 mL        05/30/24 0122    ondansetron  (ZOFRAN -ODT) 4 MG disintegrating tablet  Every 6 hours PRN        05/30/24 0122              Discharge Instructions      In the ER today after a fall resulting in a fracture of your wrist.  This was reduced in the ER because it was very displaced.  This is improved postreduction film.  Is very important for you to follow-up closely with a hand specialist.  You can use the sling as needed to help support your arm.  Keep your arm elevated.  Avoid using the arm.  You were prescribed opiate pain medication. This can have many sided effect such as drowsiness, slowed breathing, and conspitation. Do not take it with other medications that cause drowsiness, or drink alcohol while taking it. Take a stool softener over the counter while taking it.  You can also take over-the-counter Tylenol  and ibuprofen  to help with pain as directed on packaging.  As discussed, since you are on Xanax  this does increase your risk of accidental overdose, try to take them at different times, and also you were prescribed Narcan  in case of accidental overdose.     Ozell HERO. Theadore, MD [facility] [email or web address]    Theadore Ozell HERO, MD 05/30/24 [phone]

## 2024-06-01 ENCOUNTER — Ambulatory Visit: Admitting: Orthopedic Surgery

## 2024-06-01 ENCOUNTER — Other Ambulatory Visit: Payer: Self-pay | Admitting: Orthopedic Surgery

## 2024-06-01 ENCOUNTER — Encounter (HOSPITAL_BASED_OUTPATIENT_CLINIC_OR_DEPARTMENT_OTHER): Payer: Self-pay | Admitting: Orthopedic Surgery

## 2024-06-01 ENCOUNTER — Other Ambulatory Visit (INDEPENDENT_AMBULATORY_CARE_PROVIDER_SITE_OTHER): Payer: Self-pay

## 2024-06-01 ENCOUNTER — Other Ambulatory Visit: Payer: Self-pay

## 2024-06-01 ENCOUNTER — Telehealth: Payer: Self-pay | Admitting: Radiology

## 2024-06-01 DIAGNOSIS — S52601A Unspecified fracture of lower end of right ulna, initial encounter for closed fracture: Secondary | ICD-10-CM

## 2024-06-01 DIAGNOSIS — M79641 Pain in right hand: Secondary | ICD-10-CM

## 2024-06-01 DIAGNOSIS — S52501A Unspecified fracture of the lower end of right radius, initial encounter for closed fracture: Secondary | ICD-10-CM

## 2024-06-01 MED ORDER — ONDANSETRON 4 MG PO TBDP: 4.0000 mg | ORAL_TABLET | Freq: Four times a day (QID) | ORAL | 0 refills | Status: DC | PRN

## 2024-06-01 MED ORDER — OXYCODONE HCL 5 MG PO TABS
5.0000 mg | ORAL_TABLET | Freq: Four times a day (QID) | ORAL | 0 refills | Status: DC | PRN
Start: 2024-06-01 — End: 2024-06-01

## 2024-06-01 MED ORDER — OXYCODONE HCL 5 MG PO TABS: 5.0000 mg | ORAL_TABLET | Freq: Four times a day (QID) | ORAL | 0 refills | Status: DC | PRN

## 2024-06-01 NOTE — Telephone Encounter (Signed)
 [facility] called, states that they can not get the oxycodone  at this time. They have tried to reach the patient to see what pharmacy she wants it sent to but she did not answer the phone.  Please contact the patient to find out what pharmacy and send new rx to that pharmacy.

## 2024-06-01 NOTE — H&P (View-Only) (Signed)
 Kelsey Walton - 43 y.o. female MRN [id number]  Date of birth: 1981/11/18  Office Visit Note: Visit Date: 06/01/2024 PCP: Tamea Tawni Jansky, MD Referred by: Tamea Tawni Mar*  Subjective: No chief complaint on file.  HPI: Kelsey Walton is a pleasant 43 y.o. female who presents today for evaluation of a right wrist fracture sustained 2 days prior.  Ranging mechanism described as a fall from a ladder, she broke her fall with the outstretched right wrist.  She was seen in the emergency department setting the day of injury, underwent clinical and radiographic workup which showed distal radius and distal ulna fractures.  She underwent temporizing reduction and splinting in the emergency department setting and was given close orthopedic hand surgical follow-up.  Currently, she states she has having ongoing pain and swelling in the right wrist, denies any significant numbness or tingling.  She is right-handed, works in Education officer, environmental.  Overall healthy and active at baseline.  Pertinent ROS were reviewed with the patient and found to be negative unless otherwise specified above in HPI.   Visit Reason: right broken wrist, fell off ladder Duration of symptoms: 05/29/2024 Hand dominance: right Occupation: cleaner Diabetic: No, borderline, pcp wants her to take insulin but pt does not/ will not take insulin Smoking: No Heart/Lung History: none Blood Thinners: none  Prior Testing/EMG: xray Injections (Date): none Treatments:none Prior Surgery:none   Has nerve related seizures (takes xanax ) Sensitive to pain medication (vomiting)  Assessment & Plan: Visit Diagnoses:  1. Closed fracture of distal ends of right radius and ulna, initial encounter   2. Pain in right hand     Plan: Extensive discussion was had with the patient today regarding her right distal radius fracture and distal ulnar styloid fracture.  We discussed the displaced intra-articular nature of injury at the distal  radius and the associated comminution.  We discussed treatment modalities ranging from conservative to surgical.  From a conservative standpoint, we discussed ongoing immobilization to allow for ongoing healing in the position it is currently in, however I explained that given the displacement, angulation and shortening of the injury, this would predispose to potential loss of range of motion and function at the wrist level.  From a surgical standpoint, we discussed open reduction internal fixation in order to restore the anatomy, promote healing and early mobilization of the wrist, I also discussed the possibility for ulnar styloid fracture fixation and stabilization of the distal radial ulnar joint as needed.   The benefits of this procedure would be to promote fracture healing by providing stability and to heal the fracture in the appropriate alignment. The alternatives of this surgery would be to treat the fracture with immobilization in a splint/brace/cast or to do no intervention. The patient's questions were answered to satisfaction. After this discussion, patient elected to proceed with surgery. Informed consent was obtained.    Risks and benefits of the procedure were discussed, risks including but not limited to infection, bleeding, scarring, stiffness, nerve injury, tendon injury, vascular injury, hardware complication, recurrence of symptoms and need for subsequent operation.  Patient expressed understanding.  We will move forward with surgical scheduling of right wrist distal radius fracture open reduction internal fixation, possible ulnar styloid fracture fixation at the next available surgical date.    Follow-up: No follow-ups on file.   Meds & Orders: No orders of the defined types were placed in this encounter.   Orders Placed This Encounter  Procedures   XR Hand Complete Right  Procedures: No procedures performed      Clinical History: No specialty comments available.  She  reports that she quit smoking about 11 years ago. Her smoking use included cigarettes. She has never used smokeless tobacco. No results for input(s): HGBA1C, LABURIC in the last 8760 hours.  Objective:   Vital Signs: LMP 05/28/2019 (Within Days)   Physical Exam  Gen: Well-appearing, in no acute distress; non-toxic CV: Regular Rate. Well-perfused. Warm.  Resp: Breathing unlabored on room air; no wheezing. Psych: Fluid speech in conversation; appropriate affect; normal thought process  Ortho Exam Right wrist: - Splint removed today, skin is intact, moderate swelling circumferentially about the wrist with ecchymosis - Sensation intact in median/radial/ulnar distributions, AIN/PIN/interosseous intact - Elbow range of motion is preserved, no tenderness in the proximal forearm  Imaging: XR Hand Complete Right Result Date: 06/01/2024 Previously noted distal radius fracture and ulnar styloid fracture visualized.  Within the hand there is no evidence of fracture or dislocation. There is no evidence of arthropathy or other focal bone abnormality. Soft tissues are unremarkable.    Past Medical/Family/Surgical/Social History: Medications & Allergies reviewed per EMR, new medications updated. Patient Active Problem List   Diagnosis Date Noted   S/P hysterectomy 06/30/2019   Breast mass, right 07/17/2015   Past Medical History:  Diagnosis Date   Anxiety    Arthritis    Depression    PTSD (post-traumatic stress disorder)    Seizures (HCC) 09/26/2019   teeth clencing/ anxiety are symtoms of seizures   Family History  Problem Relation Age of Onset   Breast cancer Maternal Aunt        great aunt   Leukemia Maternal Grandfather    Prostate cancer Maternal Grandfather    Multiple myeloma Father        lung, neck, bone   Past Surgical History:  Procedure Laterality Date   ABDOMINAL HYSTERECTOMY N/A 06/30/2019   Procedure: HYSTERECTOMY ABDOMINAL;  Surgeon: Jayne Vonn DEL, MD;  Location:  [facility];  Service: Gynecology;  Laterality: N/A;   AUGMENTATION MAMMAPLASTY Bilateral 2008   saline   BILATERAL SALPINGECTOMY Bilateral 06/30/2019   Procedure: OPEN BILATERAL SALPINGECTOMY;  Surgeon: Jayne Vonn DEL, MD;  Location: [facility];  Service: Gynecology;  Laterality: Bilateral;   DILATION AND CURETTAGE OF UTERUS     TUBAL LIGATION     Social History   Occupational History   Not on file  Tobacco Use   Smoking status: Former    Current packs/day: 0.00    Types: Cigarettes    Quit date: 12/15/2012    Years since quitting: 11.4   Smokeless tobacco: Never  Vaping Use   Vaping status: Never Used  Substance and Sexual Activity   Alcohol use: Yes    Comment: 2-3 pints weekly   Drug use: Yes    Types: Marijuana   Sexual activity: Yes    Birth control/protection: Surgical    Comment: hyst    Dekendrick Uzelac Afton Alderton, M.D. [facility], Hand Surgery

## 2024-06-01 NOTE — Telephone Encounter (Signed)
 Do you want to send this to another pharmacy?

## 2024-06-01 NOTE — Progress Notes (Signed)
 Kelsey Walton - 43 y.o. female MRN [id number]  Date of birth: 1981/11/18  Office Visit Note: Visit Date: 06/01/2024 PCP: Tamea Tawni Jansky, MD Referred by: Tamea Tawni Mar*  Subjective: No chief complaint on file.  HPI: Kelsey Walton is a pleasant 43 y.o. female who presents today for evaluation of a right wrist fracture sustained 2 days prior.  Ranging mechanism described as a fall from a ladder, she broke her fall with the outstretched right wrist.  She was seen in the emergency department setting the day of injury, underwent clinical and radiographic workup which showed distal radius and distal ulna fractures.  She underwent temporizing reduction and splinting in the emergency department setting and was given close orthopedic hand surgical follow-up.  Currently, she states she has having ongoing pain and swelling in the right wrist, denies any significant numbness or tingling.  She is right-handed, works in Education officer, environmental.  Overall healthy and active at baseline.  Pertinent ROS were reviewed with the patient and found to be negative unless otherwise specified above in HPI.   Visit Reason: right broken wrist, fell off ladder Duration of symptoms: 05/29/2024 Hand dominance: right Occupation: cleaner Diabetic: No, borderline, pcp wants her to take insulin but pt does not/ will not take insulin Smoking: No Heart/Lung History: none Blood Thinners: none  Prior Testing/EMG: xray Injections (Date): none Treatments:none Prior Surgery:none   Has nerve related seizures (takes xanax ) Sensitive to pain medication (vomiting)  Assessment & Plan: Visit Diagnoses:  1. Closed fracture of distal ends of right radius and ulna, initial encounter   2. Pain in right hand     Plan: Extensive discussion was had with the patient today regarding her right distal radius fracture and distal ulnar styloid fracture.  We discussed the displaced intra-articular nature of injury at the distal  radius and the associated comminution.  We discussed treatment modalities ranging from conservative to surgical.  From a conservative standpoint, we discussed ongoing immobilization to allow for ongoing healing in the position it is currently in, however I explained that given the displacement, angulation and shortening of the injury, this would predispose to potential loss of range of motion and function at the wrist level.  From a surgical standpoint, we discussed open reduction internal fixation in order to restore the anatomy, promote healing and early mobilization of the wrist, I also discussed the possibility for ulnar styloid fracture fixation and stabilization of the distal radial ulnar joint as needed.   The benefits of this procedure would be to promote fracture healing by providing stability and to heal the fracture in the appropriate alignment. The alternatives of this surgery would be to treat the fracture with immobilization in a splint/brace/cast or to do no intervention. The patient's questions were answered to satisfaction. After this discussion, patient elected to proceed with surgery. Informed consent was obtained.    Risks and benefits of the procedure were discussed, risks including but not limited to infection, bleeding, scarring, stiffness, nerve injury, tendon injury, vascular injury, hardware complication, recurrence of symptoms and need for subsequent operation.  Patient expressed understanding.  We will move forward with surgical scheduling of right wrist distal radius fracture open reduction internal fixation, possible ulnar styloid fracture fixation at the next available surgical date.    Follow-up: No follow-ups on file.   Meds & Orders: No orders of the defined types were placed in this encounter.   Orders Placed This Encounter  Procedures   XR Hand Complete Right  Procedures: No procedures performed      Clinical History: No specialty comments available.  She  reports that she quit smoking about 11 years ago. Her smoking use included cigarettes. She has never used smokeless tobacco. No results for input(s): HGBA1C, LABURIC in the last 8760 hours.  Objective:   Vital Signs: LMP 05/28/2019 (Within Days)   Physical Exam  Gen: Well-appearing, in no acute distress; non-toxic CV: Regular Rate. Well-perfused. Warm.  Resp: Breathing unlabored on room air; no wheezing. Psych: Fluid speech in conversation; appropriate affect; normal thought process  Ortho Exam Right wrist: - Splint removed today, skin is intact, moderate swelling circumferentially about the wrist with ecchymosis - Sensation intact in median/radial/ulnar distributions, AIN/PIN/interosseous intact - Elbow range of motion is preserved, no tenderness in the proximal forearm  Imaging: XR Hand Complete Right Result Date: 06/01/2024 Previously noted distal radius fracture and ulnar styloid fracture visualized.  Within the hand there is no evidence of fracture or dislocation. There is no evidence of arthropathy or other focal bone abnormality. Soft tissues are unremarkable.    Past Medical/Family/Surgical/Social History: Medications & Allergies reviewed per EMR, new medications updated. Patient Active Problem List   Diagnosis Date Noted   S/P hysterectomy 06/30/2019   Breast mass, right 07/17/2015   Past Medical History:  Diagnosis Date   Anxiety    Arthritis    Depression    PTSD (post-traumatic stress disorder)    Seizures (HCC) 09/26/2019   teeth clencing/ anxiety are symtoms of seizures   Family History  Problem Relation Age of Onset   Breast cancer Maternal Aunt        great aunt   Leukemia Maternal Grandfather    Prostate cancer Maternal Grandfather    Multiple myeloma Father        lung, neck, bone   Past Surgical History:  Procedure Laterality Date   ABDOMINAL HYSTERECTOMY N/A 06/30/2019   Procedure: HYSTERECTOMY ABDOMINAL;  Surgeon: Jayne Vonn DEL, MD;  Location:  [facility];  Service: Gynecology;  Laterality: N/A;   AUGMENTATION MAMMAPLASTY Bilateral 2008   saline   BILATERAL SALPINGECTOMY Bilateral 06/30/2019   Procedure: OPEN BILATERAL SALPINGECTOMY;  Surgeon: Jayne Vonn DEL, MD;  Location: [facility];  Service: Gynecology;  Laterality: Bilateral;   DILATION AND CURETTAGE OF UTERUS     TUBAL LIGATION     Social History   Occupational History   Not on file  Tobacco Use   Smoking status: Former    Current packs/day: 0.00    Types: Cigarettes    Quit date: 12/15/2012    Years since quitting: 11.4   Smokeless tobacco: Never  Vaping Use   Vaping status: Never Used  Substance and Sexual Activity   Alcohol use: Yes    Comment: 2-3 pints weekly   Drug use: Yes    Types: Marijuana   Sexual activity: Yes    Birth control/protection: Surgical    Comment: hyst    Dekendrick Uzelac Afton Alderton, M.D. [facility], Hand Surgery

## 2024-06-07 NOTE — Anesthesia Preprocedure Evaluation (Addendum)
 Anesthesia Evaluation  Patient identified by MRN, date of birth, ID band Patient awake    Reviewed: Allergy & Precautions, NPO status , Patient's Chart, lab work & pertinent test results  Airway Mallampati: I  TM Distance: >3 FB Neck ROM: Full    Dental no notable dental hx. (+) Teeth Intact, Dental Advisory Given   Pulmonary Patient abstained from smoking., former smoker   Pulmonary exam normal breath sounds clear to auscultation       Cardiovascular Normal cardiovascular exam Rhythm:Regular Rate:Normal     Neuro/Psych Seizures -, Well Controlled,   Anxiety Depression    Hx of PTSD   GI/Hepatic negative GI ROS, Neg liver ROS,,,  Endo/Other    Renal/GU      Musculoskeletal  (+) Arthritis ,    Abdominal   Peds  Hematology negative hematology ROS (+)   Anesthesia Other Findings   Reproductive/Obstetrics                              Anesthesia Physical Anesthesia Plan  ASA: 1  Anesthesia Plan: Regional and MAC   Post-op Pain Management: Regional block*, Precedex, Tylenol  PO (pre-op)* and Minimal or no pain anticipated   Induction: Intravenous  PONV Risk Score and Plan: Treatment may vary due to age or medical condition, Midazolam , Ondansetron , Propofol  infusion and TIVA  Airway Management Planned: Natural Airway and Simple Face Mask  Additional Equipment: None  Intra-op Plan:   Post-operative Plan:   Informed Consent: I have reviewed the patients History and Physical, chart, labs and discussed the procedure including the risks, benefits and alternatives for the proposed anesthesia with the patient or authorized representative who has indicated his/her understanding and acceptance.     Dental advisory given  Plan Discussed with: CRNA and Surgeon  Anesthesia Plan Comments: (R Supraclavicular block + Mac)         Anesthesia Quick Evaluation

## 2024-06-08 ENCOUNTER — Encounter (HOSPITAL_BASED_OUTPATIENT_CLINIC_OR_DEPARTMENT_OTHER)
Admission: RE | Disposition: A | Discharge status: Home / Self Care | Payer: Self-pay | Source: Home / Self Care | Attending: Orthopedic Surgery

## 2024-06-08 ENCOUNTER — Ambulatory Visit (HOSPITAL_BASED_OUTPATIENT_CLINIC_OR_DEPARTMENT_OTHER): Payer: Self-pay | Admitting: Anesthesiology

## 2024-06-08 ENCOUNTER — Other Ambulatory Visit: Payer: Self-pay

## 2024-06-08 ENCOUNTER — Ambulatory Visit (HOSPITAL_BASED_OUTPATIENT_CLINIC_OR_DEPARTMENT_OTHER)
Admission: RE | Admit: 2024-06-08 | Discharge: 2024-06-08 | Disposition: A | Discharge status: Home / Self Care | Attending: Orthopedic Surgery | Admitting: Orthopedic Surgery

## 2024-06-08 ENCOUNTER — Encounter (HOSPITAL_BASED_OUTPATIENT_CLINIC_OR_DEPARTMENT_OTHER): Payer: Self-pay | Admitting: Orthopedic Surgery

## 2024-06-08 ENCOUNTER — Ambulatory Visit (HOSPITAL_BASED_OUTPATIENT_CLINIC_OR_DEPARTMENT_OTHER)

## 2024-06-08 DIAGNOSIS — Z87891 Personal history of nicotine dependence: Secondary | ICD-10-CM | POA: Diagnosis not present

## 2024-06-08 DIAGNOSIS — S52611A Displaced fracture of right ulna styloid process, initial encounter for closed fracture: Secondary | ICD-10-CM | POA: Insufficient documentation

## 2024-06-08 DIAGNOSIS — W11XXXA Fall on and from ladder, initial encounter: Secondary | ICD-10-CM | POA: Diagnosis not present

## 2024-06-08 DIAGNOSIS — S52501A Unspecified fracture of the lower end of right radius, initial encounter for closed fracture: Secondary | ICD-10-CM | POA: Diagnosis present

## 2024-06-08 DIAGNOSIS — M1990 Unspecified osteoarthritis, unspecified site: Secondary | ICD-10-CM | POA: Diagnosis not present

## 2024-06-08 DIAGNOSIS — S52511D Displaced fracture of right radial styloid process, subsequent encounter for closed fracture with routine healing: Secondary | ICD-10-CM | POA: Diagnosis not present

## 2024-06-08 DIAGNOSIS — S52531A Colles' fracture of right radius, initial encounter for closed fracture: Secondary | ICD-10-CM

## 2024-06-08 SURGERY — OPEN REDUCTION INTERNAL FIXATION (ORIF) DISTAL RADIUS FRACTURE
Anesthesia: Monitor Anesthesia Care | Site: Wrist | Laterality: Right

## 2024-06-08 MED ORDER — EPHEDRINE SULFATE-NACL 50-0.9 MG/10ML-% IV SOSY
PREFILLED_SYRINGE | INTRAVENOUS | Status: DC | PRN
  Administered 2024-06-08: 10 mg via INTRAVENOUS
  Administered 2024-06-08: 5 mg via INTRAVENOUS
  Administered 2024-06-08: 10 mg via INTRAVENOUS

## 2024-06-08 MED ORDER — CEFAZOLIN SODIUM-DEXTROSE 2-4 GM/100ML-% IV SOLN
INTRAVENOUS | Status: AC
  Filled 2024-06-08: qty 100

## 2024-06-08 MED ORDER — OXYCODONE HCL 5 MG PO TABS: 5.0000 mg | ORAL_TABLET | Freq: Once | ORAL | Status: DC | PRN

## 2024-06-08 MED ORDER — FENTANYL CITRATE (PF) 100 MCG/2ML IJ SOLN
100.0000 ug | Freq: Once | INTRAMUSCULAR | Status: AC
  Administered 2024-06-08: 100 ug via INTRAVENOUS

## 2024-06-08 MED ORDER — BUPIVACAINE-EPINEPHRINE (PF) 0.5% -1:200000 IJ SOLN
INTRAMUSCULAR | Status: DC | PRN
  Administered 2024-06-08: 30 mL via PERINEURAL

## 2024-06-08 MED ORDER — OXYCODONE HCL 5 MG/5ML PO SOLN: 5.0000 mg | Freq: Once | ORAL | Status: DC | PRN

## 2024-06-08 MED ORDER — ONDANSETRON HCL 4 MG/2ML IJ SOLN: 4.0000 mg | Freq: Once | INTRAMUSCULAR | Status: DC | PRN

## 2024-06-08 MED ORDER — 0.9 % SODIUM CHLORIDE (POUR BTL) OPTIME
TOPICAL | Status: DC | PRN
Start: 2024-06-08 — End: 2024-06-08
  Administered 2024-06-08: 1000 mL

## 2024-06-08 MED ORDER — LACTATED RINGERS IV SOLN: INTRAVENOUS | Status: DC

## 2024-06-08 MED ORDER — CEFAZOLIN SODIUM-DEXTROSE 2-4 GM/100ML-% IV SOLN
2.0000 g | INTRAVENOUS | Status: AC
  Administered 2024-06-08: 2 g via INTRAVENOUS

## 2024-06-08 MED ORDER — PROPOFOL 500 MG/50ML IV EMUL
INTRAVENOUS | Status: DC | PRN
  Administered 2024-06-08: 125 ug/kg/min via INTRAVENOUS

## 2024-06-08 MED ORDER — EPHEDRINE 5 MG/ML INJ
INTRAVENOUS | Status: AC
  Filled 2024-06-08: qty 10

## 2024-06-08 MED ORDER — MIDAZOLAM HCL 2 MG/2ML IJ SOLN
INTRAMUSCULAR | Status: AC
  Filled 2024-06-08: qty 2

## 2024-06-08 MED ORDER — ACETAMINOPHEN 10 MG/ML IV SOLN: 1000.0000 mg | Freq: Once | INTRAVENOUS | Status: DC | PRN

## 2024-06-08 MED ORDER — FENTANYL CITRATE (PF) 100 MCG/2ML IJ SOLN
INTRAMUSCULAR | Status: AC
  Filled 2024-06-08: qty 2

## 2024-06-08 MED ORDER — MIDAZOLAM HCL 2 MG/2ML IJ SOLN
2.0000 mg | Freq: Once | INTRAMUSCULAR | Status: AC
  Administered 2024-06-08: 2 mg via INTRAVENOUS

## 2024-06-08 MED ORDER — HYDROMORPHONE HCL 1 MG/ML IJ SOLN: 0.2500 mg | INTRAMUSCULAR | Status: DC | PRN

## 2024-06-08 MED ORDER — PROPOFOL 10 MG/ML IV BOLUS
INTRAVENOUS | Status: DC | PRN
  Administered 2024-06-08: 30 mg via INTRAVENOUS

## 2024-06-08 MED ORDER — OXYCODONE HCL 5 MG PO TABS: 5.0000 mg | ORAL_TABLET | Freq: Four times a day (QID) | ORAL | 0 refills | Status: DC | PRN

## 2024-06-08 SURGICAL SUPPLY — 59 items
BIT DRILL SOLID 2.0X40MM (BIT) IMPLANT
BIT DRILL SOLID 2.5X40MM (BIT) IMPLANT
BLADE SURG 15 STRL LF DISP TIS (BLADE) ×2 IMPLANT
BNDG COHESIVE 4X5 TAN STRL LF (GAUZE/BANDAGES/DRESSINGS) ×1 IMPLANT
BNDG COMPR ESMARK 4X3 LF (GAUZE/BANDAGES/DRESSINGS) ×1 IMPLANT
BNDG ELASTIC 4INX 5YD STR LF (GAUZE/BANDAGES/DRESSINGS) ×2 IMPLANT
BNDG GAUZE DERMACEA FLUFF 4 (GAUZE/BANDAGES/DRESSINGS) ×1 IMPLANT
CHLORAPREP W/TINT 26 (MISCELLANEOUS) ×1 IMPLANT
CORD BIPOLAR FORCEPS 12FT (ELECTRODE) ×1 IMPLANT
COVER BACK TABLE 60X90IN (DRAPES) ×1 IMPLANT
CUFF TOURN SGL QUICK 18X4 (TOURNIQUET CUFF) ×1 IMPLANT
CUFF TRNQT CYL 24X4X16.5-23 (TOURNIQUET CUFF) IMPLANT
DRAPE HAND 77X146 (DRAPES) ×1 IMPLANT
DRAPE OEC MINIVIEW 54X84 (DRAPES) ×1 IMPLANT
DRAPE SURG 17X23 STRL (DRAPES) ×1 IMPLANT
GAUZE PAD ABD 8X10 STRL (GAUZE/BANDAGES/DRESSINGS) ×1 IMPLANT
GAUZE SPONGE 4X4 12PLY STRL (GAUZE/BANDAGES/DRESSINGS) ×1 IMPLANT
GAUZE XEROFORM 1X8 LF (GAUZE/BANDAGES/DRESSINGS) ×1 IMPLANT
GLOVE BIO SURGEON STRL SZ7.5 (GLOVE) ×2 IMPLANT
GLOVE BIOGEL PI IND STRL 7.5 (GLOVE) ×2 IMPLANT
GOWN STRL REUS W/ TWL LRG LVL3 (GOWN DISPOSABLE) ×1 IMPLANT
GOWN STRL SURGICAL XL XLNG (GOWN DISPOSABLE) ×2 IMPLANT
GUIDE AIMING 1.5MM (WIRE) IMPLANT
KWIRE 1.6X65 (WIRE) IMPLANT
MANIFOLD NEPTUNE II (INSTRUMENTS) ×1 IMPLANT
NDL HYPO 25X1 1.5 SAFETY (NEEDLE) IMPLANT
NDL HYPO 25X5/8 SAFETYGLIDE (NEEDLE) IMPLANT
NEEDLE HYPO 25X1 1.5 SAFETY (NEEDLE) IMPLANT
NEEDLE HYPO 25X5/8 SAFETYGLIDE (NEEDLE) IMPLANT
NS IRRIG 1000ML POUR BTL (IV SOLUTION) IMPLANT
PACK BASIN DAY SURGERY FS (CUSTOM PROCEDURE TRAY) ×1 IMPLANT
PAD CAST 4YDX4 CTTN HI CHSV (CAST SUPPLIES) ×2 IMPLANT
PEG GEMINUS THRD LOCK 2.3X14 (Peg) IMPLANT
PEG NON LOCK THREAD 2.7X24MM (Screw) IMPLANT
PEG THREADED LOCK 2.3X16 (Screw) IMPLANT
PLATE GEMINUS DIST RTN 4HL (Plate) IMPLANT
SCREW GEMINUS CORT LOCK 3.5X11 (Screw) IMPLANT
SCREW NONLOCK POLYAXIAL 3.5X11 (Screw) IMPLANT
SCREW POLY NON LOCK 3.5MMX12MM (Screw) IMPLANT
SCREWDRIVER SURG ST 2 (INSTRUMENTS) IMPLANT
SHEET MEDIUM DRAPE 40X70 STRL (DRAPES) ×1 IMPLANT
SLEEVE SCD COMPRESS KNEE MED (STOCKING) IMPLANT
SLING ARM FOAM STRAP LRG (SOFTGOODS) IMPLANT
SPIKE FLUID TRANSFER (MISCELLANEOUS) IMPLANT
SPLINT PLASTER CAST XFAST 4X15 (CAST SUPPLIES) ×10 IMPLANT
SPONGE T-LAP 18X18 ~~LOC~~+RFID (SPONGE) IMPLANT
SPONGE T-LAP 4X18 ~~LOC~~+RFID (SPONGE) ×1 IMPLANT
STOCKINETTE IMPERVIOUS 9X36 MD (GAUZE/BANDAGES/DRESSINGS) ×1 IMPLANT
SUCTION TUBE FRAZIER 10FR DISP (SUCTIONS) ×1 IMPLANT
SUT ETHILON 4 0 PS 2 18 (SUTURE) ×1 IMPLANT
SUT MNCRL AB 3-0 PS2 18 (SUTURE) IMPLANT
SUT MNCRL AB 3-0 PS2 27 (SUTURE) ×1 IMPLANT
SUT VIC AB 4-0 PS2 18 (SUTURE) IMPLANT
SYR BULB EAR ULCER 3OZ GRN STR (SYRINGE) ×2 IMPLANT
SYR CONTROL 10ML LL (SYRINGE) IMPLANT
TOWEL GREEN STERILE FF (TOWEL DISPOSABLE) ×2 IMPLANT
TUBE CONNECTING 20X1/4 (TUBING) ×1 IMPLANT
UNDERPAD 30X36 HEAVY ABSORB (UNDERPADS AND DIAPERS) ×1 IMPLANT
WIRE FIX 1.5 STD TIP (WIRE) IMPLANT

## 2024-06-08 NOTE — Discharge Instructions (Addendum)
 Hand Surgery Postop Instructions   Dressings: Maintain postoperative dressing until orthopedic follow-up.  Keep operative site clean and dry until orthopedic follow-up.  Wound Care: Keep your hand elevated above the level of your heart.  Do not allow it to dangle by your side. Moving your fingers is advised to stimulate circulation but will depend on the site of your surgery.  If you have a splint applied, your doctor will advise you regarding movement.  Activity: Do not drive or operate machinery until clearance given from physician. No heavy lifting with operative extremity.  Diet:  Drink liquids today or eat a light diet.  You may resume a regular diet tomorrow.    General expectations: Take prescribed medication if given, transition to over-the-counter medication as quickly as possible. Fingers may become slightly swollen.  Call your doctor if any of the following occur: Severe pain not relieved by pain medication. Elevated temperature. Dressing soaked with blood. Inability to move fingers. White or bluish color to fingers.   Per [facility] clinic policy, our goal is ensure optimal postoperative pain control with a multimodal pain management strategy. For all [facility] patients, our goal is to wean post-operative narcotic medications by 6 weeks post-operatively. If this is not possible due to utilization of pain medication prior to surgery, your [facility] doctor will support your acute post-operative pain control for the first 6 weeks postoperatively, with a plan to transition you back to your primary pain team following that. Maralee will work to ensure a Therapist, occupational.  Anshul Afton Alderton, M.D. Hand Surgery [place] [facility] Anesthesia Blocks  1. You may not be able to move or feel the blocked extremity after a regional anesthetic block. This may last may last from 3-48 hours after placement, but it will go away. The length of time depends on the  medication injected and your individual response to the medication. As the nerves start to wake up, you may experience tingling as the movement and feeling returns to your extremity. If the numbness and inability to move your extremity has not gone away after 48 hours, please call your surgeon.   2. The extremity that is blocked will need to be protected until the numbness is gone and the strength has returned. Because you cannot feel it, you will need to take extra care to avoid injury. Because it may be weak, you may have difficulty moving it or using it. You may not know what position it is in without looking at it while the block is in effect.  3. For blocks in the legs and feet, returning to weight bearing and walking needs to be done carefully. You will need to wait until the numbness is entirely gone and the strength has returned. You should be able to move your leg and foot normally before you try and bear weight or walk. You will need someone to be with you when you first try to ensure you do not fall and possibly risk injury.  4. Bruising and tenderness at the needle site are common side effects and will resolve in a few days.  5. Persistent numbness or new problems with movement should be communicated to the surgeon or the [facility] [id number] [facility] [phone]). Post Anesthesia Home Care Instructions  Activity: Get plenty of rest for the remainder of the day. A responsible individual must stay with you for 24 hours following the procedure.  For the next 24 hours, DO NOT: -Drive  a car -Advertising copywriter -Drink alcoholic beverages -Take any medication unless instructed by your physician -Make any legal decisions or sign important papers.  Meals: Start with liquid foods such as gelatin or soup. Progress to regular foods as tolerated. Avoid greasy, spicy, heavy foods. If nausea and/or vomiting occur, drink only clear liquids until the nausea and/or  vomiting subsides. Call your physician if vomiting continues.  Special Instructions/Symptoms: Your throat may feel dry or sore from the anesthesia or the breathing tube placed in your throat during surgery. If this causes discomfort, gargle with warm salt water. The discomfort should disappear within 24 hours.  If you had a scopolamine  patch placed behind your ear for the management of post- operative nausea and/or vomiting:  1. The medication in the patch is effective for 72 hours, after which it should be removed.  Wrap patch in a tissue and discard in the trash. Wash hands thoroughly with soap and water. 2. You may remove the patch earlier than 72 hours if you experience unpleasant side effects which may include dry mouth, dizziness or visual disturbances. 3. Avoid touching the patch. Wash your hands with soap and water after contact with the patch.    Post Anesthesia Home Care Instructions  Activity: Get plenty of rest for the remainder of the day. A responsible individual must stay with you for 24 hours following the procedure.  For the next 24 hours, DO NOT: -Drive a car -Advertising copywriter -Drink alcoholic beverages -Take any medication unless instructed by your physician -Make any legal decisions or sign important papers.  Meals: Start with liquid foods such as gelatin or soup. Progress to regular foods as tolerated. Avoid greasy, spicy, heavy foods. If nausea and/or vomiting occur, drink only clear liquids until the nausea and/or vomiting subsides. Call your physician if vomiting continues.  Special Instructions/Symptoms: Your throat may feel dry or sore from the anesthesia or the breathing tube placed in your throat during surgery. If this causes discomfort, gargle with warm salt water. The discomfort should disappear within 24 hours.  If you had a scopolamine  patch placed behind your ear for the management of post- operative nausea and/or vomiting:  1. The medication in the  patch is effective for 72 hours, after which it should be removed.  Wrap patch in a tissue and discard in the trash. Wash hands thoroughly with soap and water. 2. You may remove the patch earlier than 72 hours if you experience unpleasant side effects which may include dry mouth, dizziness or visual disturbances. 3. Avoid touching the patch. Wash your hands with soap and water after contact with the patch.

## 2024-06-08 NOTE — Op Note (Signed)
 NAME: Kelsey Walton MEDICAL RECORD NO: [id number] DATE OF BIRTH: 1981-11-23 FACILITY: [facility] LOCATION: [facility] PHYSICIAN: GILDARDO ALDERTON, MD   OPERATIVE REPORT   DATE OF PROCEDURE: 06/08/24    PREOPERATIVE DIAGNOSIS: Right distal radius fracture with associated ulnar styloid fracture   POSTOPERATIVE DIAGNOSIS: Right distal radius fracture with associated ulnar styloid fracture   PROCEDURE: Right distal radius fracture open reduction internal fixation   SURGEON:  GILDARDO ALDERTON, M.D.   ASSISTANT: Almeda Rummer, PA   ANESTHESIA:  Regional with sedation   INTRAVENOUS FLUIDS:  Per anesthesia flow sheet.   ESTIMATED BLOOD LOSS:  Minimal.   COMPLICATIONS:  None.   SPECIMENS:  none   TOURNIQUET TIME:    Total Tourniquet Time Documented: Upper Arm (Right) - 41 minutes Total: Upper Arm (Right) - 41 minutes    DISPOSITION:  Stable to PACU.   INDICATIONS: 43 year old female who sustained a right distal radius fracture with associated ulnar styloid fracture with notable displacement and comminution.  Patient was indicated for right distal radius open reduction internal fixation in order to restore anatomy, promote healing and early mobilization.  Risks and benefits of surgery were discussed including the risks of infection, bleeding, scarring, stiffness, nerve injury, vascular injury, tendon injury, need for subsequent operation, hardware failure, nonunion, malunion.  She voiced understanding of these risks and elected to proceed.  OPERATIVE COURSE: Patient was seen and identified in the preoperative area and marked appropriately.  Surgical consent had been signed. Preoperative IV antibiotic prophylaxis was given. She was transferred to the operating room and placed in supine position with the Right upper extremity on an arm board.  Sedation was induced by the anesthesiologist. A regional block had been performed by anesthesia in preoperative holding.    Right  upper extremity was prepped and draped in normal sterile orthopedic fashion.  A surgical pause was performed between the surgeons, anesthesia, and operating room staff and all were in agreement as to the patient, procedure, and site of procedure.  Tourniquet was placed and padded appropriately for the right upper arm.  Longitudinal incision was designed over the volar radial aspect of the wrist, stemming from just proximal to the wrist crease into the forearm.  15 blade was utilized for skin, blunt dissection was performed down, all neurovascular structures were protected in their entirety throughout.  Radial artery was identified and protected.  Standard FCR approach was used.  Fibers over the FCR tendon were sectioned longitudinally, tendon was retracted ulnarly and FCR sheath was incised.  Blunt dissection performed, FPL also retracted ulnarly, after which pronator quadratus was elevated sharply from distal radius at the distal and radial borders in order to expose the underlying fracture site.  There was noted to be significant comminution at the fracture site.  Gentle reduction maneuver was performed of the fragments under biplanar fluoroscopy.  Once we were satisfied with our reduction, narrow 4-hole [employer] plate of appropriate laterality was selected and temporarily affixed to bone utilizing a combination of clamp and K wires.  Biplanar fluoroscopy was once again utilized to confirm appropriate plate placement and maintenance of fracture reduction.  Shaft screw was then placed to secure the plate proximally.  Nonlocking screw was utilized distally initially to capture the dorsal comminution and restore volar tilt.  Volar tilt was noted to be restored appropriately.  Remaining distal holes were filled utilizing locking screws taking care to ensure all screws remained extra-articular and beneath the joint line.  Nonlocking screw distally was  then swapped for a locking screw of shorter variety to  once again ensure screw remained well within bone.  Remaining shaft screws were all filled using combination of locking and nonlocking screws in bicortical fashion.  Distal radioulnar joint was then stressed and noted to be stable in all planes.  Decision was made to treat ulnar styloid fracture in closed manner.  Final fluoroscopic views were taken utilizing biplanar fluoroscopy which confirmed appropriate plate placement, all fracture fragments had been secured appropriately and joint line had been restored as well.  Gentle flexion and extension of the wrist under fluoroscopy indicated stable appearance of the wrist with appropriately secured fracture fragments.  The tourniquet was deflated at 41 minutes.  Fingertips were pink with brisk capillary refill after deflation of tourniquet.  Copious irrigation was performed.  Layered closure was performed utilizing a combination of 3-0 Monocryl for subcutaneous layer and 4-0 nylon for the skin surface.  Sterile dressings were applied followed by application of a short arm volar slab splint utilizing plaster.  The operative drapes were broken down.  Sling was applied.  The patient was awoken from anesthesia safely and taken to PACU in stable condition.  I will see her back in the office in 2 weeks for postoperative followup.    Post-operative plan: The patient will recover in the post-anesthesia care unit and then be discharged home.  The patient will be non weight bearing on the right upper extremity in a short arm splint.   I will see the patient back in the office in 2 weeks for postoperative followup.  Discharge instructions were provided for appropriate dressing maintenance and pain control.  Deshaun Schou, MD Electronically signed, 06/08/24

## 2024-06-08 NOTE — Anesthesia Postprocedure Evaluation (Signed)
 Anesthesia Post Note  Patient: Kelsey Walton  Procedure(s) Performed: OPEN REDUCTION INTERNAL FIXATION (ORIF) DISTAL RADIUS FRACTURE (Right: Wrist)     Patient location during evaluation: PACU Anesthesia Type: Regional Level of consciousness: awake and alert Pain management: pain level controlled Vital Signs Assessment: post-procedure vital signs reviewed and stable Respiratory status: spontaneous breathing, nonlabored ventilation, respiratory function stable and patient connected to nasal cannula oxygen Cardiovascular status: stable and blood pressure returned to baseline Postop Assessment: no apparent nausea or vomiting Anesthetic complications: no   No notable events documented.  Last Vitals:  Vitals:   06/08/24 1258 06/08/24 1307  BP:  (!) 97/57  Pulse: 82 80  Resp: 13 20  Temp:  (!) 36.1 C  SpO2: 99% 96%    Last Pain:  Vitals:   06/08/24 1307  TempSrc: Temporal  PainSc: 0-No pain                 Garnette [name]

## 2024-06-08 NOTE — Anesthesia Procedure Notes (Signed)
 Procedure Name: MAC Date/Time: 06/08/2024 10:55 AM  Performed by: Claudene Delon SQUIBB, CRNAPre-anesthesia Checklist: Patient identified, Emergency Drugs available, Suction available, Patient being monitored and Timeout performed Patient Re-evaluated:Patient Re-evaluated prior to induction Oxygen Delivery Method: Simple face mask Placement Confirmation: positive ETCO2 Dental Injury: Teeth and Oropharynx as per pre-operative assessment

## 2024-06-08 NOTE — Progress Notes (Signed)
Assisted Dr. Richardson Landry with right, supraclavicular, ultrasound guided block. Side rails up, monitors on throughout procedure. See vital signs in flow sheet. Tolerated Procedure well.

## 2024-06-08 NOTE — Interval H&P Note (Signed)
 History and Physical Interval Note:  06/08/2024 9:21 AM  Kelsey Walton  has presented today for surgery, with the diagnosis of RIGHT DISTAL RADIUS FRACTURE.  The various methods of treatment have been discussed with the patient and family. After consideration of risks, benefits and other options for treatment, the patient has consented to  Procedure(s) with comments: OPEN REDUCTION INTERNAL FIXATION (ORIF) DISTAL RADIUS FRACTURE (Right) - RIGHT WRIST OPEN REDUCTION INTERNAL FIXATION DISTAL RADIUS AND POSSIBLE ULNAR STYLOID as a surgical intervention.  The patient's history has been reviewed, patient examined, no change in status, stable for surgery.  I have reviewed the patient's chart and labs.  Questions were answered to the patient's satisfaction.     Demaris Bousquet

## 2024-06-08 NOTE — Transfer of Care (Signed)
 Immediate Anesthesia Transfer of Care Note  Patient: Kelsey Walton  Procedure(s) Performed: OPEN REDUCTION INTERNAL FIXATION (ORIF) DISTAL RADIUS FRACTURE (Right: Wrist)  Patient Location: PACU  Anesthesia Type:MAC  Level of Consciousness: awake and patient cooperative  Airway & Oxygen Therapy: Patient Spontanous Breathing and Patient connected to face mask oxygen  Post-op Assessment: Report given to RN and Post -op Vital signs reviewed and stable  Post vital signs: Reviewed and stable  Last Vitals:  Vitals Value Taken Time  BP 112/54 06/08/24 12:38  Temp    Pulse 94 06/08/24 12:40  Resp 20 06/08/24 12:40  SpO2 100 % 06/08/24 12:40  Vitals shown include unfiled device data.  Last Pain:  Vitals:   06/08/24 0910  TempSrc: Temporal  PainSc: 10-Worst pain ever      Patients Stated Pain Goal: 8 (06/08/24 0910)  Complications: No notable events documented.

## 2024-06-08 NOTE — Anesthesia Procedure Notes (Signed)
 Anesthesia Regional Block: Supraclavicular block   Pre-Anesthetic Checklist: , timeout performed,  Correct Patient, Correct Site, Correct Laterality,  Correct Procedure, Correct Position, site marked,  Risks and benefits discussed,  Surgical consent,  Pre-op evaluation,  At surgeon's request and post-op pain management  Laterality: Upper and Right  Prep: chloraprep       Needles:  Injection technique: Single-shot  Needle Type: Echogenic Needle     Needle Length: 5cm  Needle Gauge: 21     Additional Needles:   Procedures:,,,, ultrasound used (permanent image in chart),,     Nerve Stimulator or Paresthesia:   Additional Responses:  Block tested.  Patient tolerated procedure well Narrative:  Start time: 06/08/2024 9:43 AM End time: 06/08/2024 9:48 AM Injection made incrementally with aspirations every 5 mL.  Performed by: Personally  Anesthesiologist: Jefm Garnette LABOR, MD  Additional Notes: Block tested. Patient tolerated procedure well.

## 2024-06-09 ENCOUNTER — Encounter (HOSPITAL_BASED_OUTPATIENT_CLINIC_OR_DEPARTMENT_OTHER): Payer: Self-pay | Admitting: Orthopedic Surgery

## 2024-06-18 ENCOUNTER — Telehealth: Payer: Self-pay | Admitting: Orthopedic Surgery

## 2024-06-18 ENCOUNTER — Other Ambulatory Visit: Payer: Self-pay | Admitting: Orthopedic Surgery

## 2024-06-18 MED ORDER — ONDANSETRON 4 MG PO TBDP: 4.0000 mg | ORAL_TABLET | Freq: Four times a day (QID) | ORAL | 0 refills | Status: AC | PRN

## 2024-06-18 MED ORDER — OXYCODONE HCL 5 MG PO TABS: 5.0000 mg | ORAL_TABLET | Freq: Four times a day (QID) | ORAL | 0 refills | Status: DC | PRN

## 2024-06-18 NOTE — Telephone Encounter (Signed)
 Pt called requesting refills of ondansetron  and oxycodone . Please send to [facility] [address] [place].  Pt phone number is [phone].

## 2024-06-18 NOTE — Telephone Encounter (Signed)
 Called and advised.

## 2024-06-18 NOTE — Telephone Encounter (Signed)
 Pt is having a lot of pain. I advised pt to alternate ibu and tylenol (no more 3,000mg  a day since on oxy) and elevate as much as possible. She stated understanding

## 2024-06-21 NOTE — Therapy (Incomplete)
 OUTPATIENT OCCUPATIONAL THERAPY ORTHO EVALUATION  Patient Name: Kelsey Walton MRN: [id number] DOB:May 16, 1981, 43 y.o., female Today's Date: 06/21/2024  PCP: Dr. Adina REFERRING PROVIDER: Dr. [name]  END OF SESSION:   Past Medical History:  Diagnosis Date   Anxiety    Arthritis    Depression    PTSD (post-traumatic stress disorder)    Seizures (HCC) 09/26/2019   teeth clencing/ anxiety are symtoms of seizures   Past Surgical History:  Procedure Laterality Date   ABDOMINAL HYSTERECTOMY N/A 06/30/2019   Procedure: HYSTERECTOMY ABDOMINAL;  Surgeon: Jayne Vonn DEL, MD;  Location: [facility];  Service: Gynecology;  Laterality: N/A;   AUGMENTATION MAMMAPLASTY Bilateral 2008   saline   BILATERAL SALPINGECTOMY Bilateral 06/30/2019   Procedure: OPEN BILATERAL SALPINGECTOMY;  Surgeon: Jayne Vonn DEL, MD;  Location: [facility];  Service: Gynecology;  Laterality: Bilateral;   DILATION AND CURETTAGE OF UTERUS     OPEN REDUCTION INTERNAL FIXATION (ORIF) DISTAL RADIAL FRACTURE Right 06/08/2024   Procedure: OPEN REDUCTION INTERNAL FIXATION (ORIF) DISTAL RADIUS FRACTURE;  Surgeon: [name] Buster, MD;  Location: [facility];  Service: Orthopedics;  Laterality: Right;  RIGHT WRIST OPEN REDUCTION INTERNAL FIXATION DISTAL RADIUS AND POSSIBLE ULNAR STYLOID   TUBAL LIGATION     Patient Active Problem List   Diagnosis Date Noted   Fracture, Colles, right, closed 06/08/2024   S/P hysterectomy 06/30/2019   Breast mass, right 07/17/2015    ONSET DATE: 06/08/24  REFERRING DIAG:  Diagnosis  S52.501A,S52.601A (ICD-10-CM) - Closed fracture of distal ends of right radius and ulna, initial encounter    THERAPY DIAG:  No diagnosis found.  Rationale for Evaluation and Treatment: Rehabilitation  SUBJECTIVE:   SUBJECTIVE STATEMENT: *** Pt accompanied by: {accompnied:27141}  PERTINENT HISTORY: ***  PRECAUTIONS: Other: splint, NWB RUE, cleared for gentle digital and wrist forearm  A/ROM  RED FLAGS: {PT Red Flags:29287}   WEIGHT BEARING RESTRICTIONS: Yes NWB RUE  PAIN:  Are you having pain? {OPRCPAIN:27236}  FALLS: Has patient fallen in last 6 months? {fallsyesno:27318}  LIVING ENVIRONMENT: Lives with: {OPRC lives with:25569::lives with their family} Lives in: {Lives in:25570} Stairs: {opstairs:27293} Has following equipment at home: {Assistive devices:23999}  PLOF: {PLOF:24004}  PATIENT GOALS: ***  NEXT MD VISIT: ***  OBJECTIVE:  Note: Objective measures were completed at Evaluation unless otherwise noted.  HAND DOMINANCE: {MISC; OT HAND DOMINANCE:[phone]}  ADLs: {ADLs OT:31716}  FUNCTIONAL OUTCOME MEASURES: {OTFUNCTIONALMEASURES:27238}  UPPER EXTREMITY ROM:     {AROM/PROM:27142} ROM Right eval Left eval  Shoulder flexion    Shoulder abduction    Shoulder adduction    Shoulder extension    Shoulder internal rotation    Shoulder external rotation    Elbow flexion    Elbow extension    Wrist flexion    Wrist extension    Wrist ulnar deviation    Wrist radial deviation    Wrist pronation    Wrist supination    (Blank rows = not tested)  {AROM/PROM:27142} ROM Right eval Left eval  Thumb MCP (0-60)    Thumb IP (0-80)    Thumb Radial abd/add (0-55)     Thumb Palmar abd/add (0-45)     Thumb Opposition to Small Finger     Index MCP (0-90)     Index PIP (0-100)     Index DIP (0-70)      Long MCP (0-90)      Long PIP (0-100)      Long DIP (0-70)      Ring MCP (  0-90)      Ring PIP (0-100)      Ring DIP (0-70)      Little MCP (0-90)      Little PIP (0-100)      Little DIP (0-70)      (Blank rows = not tested)   UPPER EXTREMITY MMT:     MMT Right eval Left eval  Shoulder flexion    Shoulder abduction    Shoulder adduction    Shoulder extension    Shoulder internal rotation    Shoulder external rotation    Middle trapezius    Lower trapezius    Elbow flexion    Elbow extension    Wrist flexion    Wrist  extension    Wrist ulnar deviation    Wrist radial deviation    Wrist pronation    Wrist supination    (Blank rows = not tested)  HAND FUNCTION: {handfunction:27230}  COORDINATION: {otcoordination:27237}  SENSATION: {sensation:27233}  EDEMA: ***  COGNITION: Overall cognitive status: {cognition:24006} Areas of impairment: {impairedcognition:27234}  OBSERVATIONS: ***   TREATMENT DATE: ***                                                                                                                            Modalities: {OPRCMODALITIES:31717}     PATIENT EDUCATION: Education details: *** Person educated: {Person educated:25204} Education method: {Education Method:25205} Education comprehension: {Education Comprehension:25206}  HOME EXERCISE PROGRAM: ***  GOALS: Goals reviewed with patient? {yes/no:20286}  SHORT TERM GOALS: Target date: ***  I with inital HEP Baseline: Goal status: INITIAL  2.  I with splint wear, care and precautions following 1-2 weeks of wear to ensure proper fit Baseline:  Goal status: INITIAL  3.  Pt will demonstrate composite finger flexion for at least 90% for increased functional use during ADLs. Baseline:  Goal status: INITIAL  4.  Pt will increase wrist flexion and extension by 15 from baseline for increased ease with ADLs. Baseline:  Goal status: INITIAL  5.  *** Baseline:  Goal status: INITIAL  6.  *** Baseline:  Goal status: INITIAL  LONG TERM GOALS: Target date: ***  I with updated HEP Baseline:  Goal status: INITIAL  2.  Pt will demonstrate grip strength of at least 25 lbs for RUE for increased ease withIADLs. Baseline:  Goal status: INITIAL  3.  Pt will demonstrate improved RUE functional use as evidenced by improving Quick DASH score to Baseline:  Goal status: INITIAL  4.  Pt will demonstrate wrist flexion/ extension WFLs for ADLs/IADLs.  Baseline:  Goal status: INITIAL  5.  Pt will resume use of  RUE as dominant hand at least 90% of the time with pain no greater than 3/10. Baseline:  Goal status: INITIAL  6.  *** Baseline:  Goal status: INITIAL  ASSESSMENT:  CLINICAL IMPRESSION: Patient is a 43 y.o. female who was seen today for occupational therapy evaluation for  Diagnosis  S52.501A,S52.601A (ICD-10-CM) - Closed fracture  of distal ends of right radius and ulna, initial encounter   Pt presents with the performance deficits below. She can benefit from skilled occupational therapy to address these deficits in order to maximize pt's safety and I with ADLs/IADLs.  PERFORMANCE DEFICITS: in functional skills including {OT physical skills:25468}, cognitive skills including {OT cognitive skills:25469}, and psychosocial skills including {OT psychosocial skills:25470}.   IMPAIRMENTS: are limiting patient from {OT performance deficits:25471}.   COMORBIDITIES: {Comorbidities:25485} that affects occupational performance. Patient will benefit from skilled OT to address above impairments and improve overall function.  MODIFICATION OR ASSISTANCE TO COMPLETE EVALUATION: {OT modification:25474}  OT OCCUPATIONAL PROFILE AND HISTORY: {OT PROFILE AND HISTORY:25484}  CLINICAL DECISION MAKING: {OT CDM:25475}  REHAB POTENTIAL: {rehabpotential:25112}  EVALUATION COMPLEXITY: {Evaluation complexity:25115}      PLAN:  OT FREQUENCY: {rehab frequency:25116}  OT DURATION: {rehab duration:25117}  PLANNED INTERVENTIONS: {OT Interventions:25467}  RECOMMENDED OTHER SERVICES: ***  CONSULTED AND AGREED WITH PLAN OF CARE: {ENR:74513}  PLAN FOR NEXT SESSION: PIERRETTE FREDERICO COLLAR, OT 06/21/2024, 3:47 PM

## 2024-06-22 ENCOUNTER — Ambulatory Visit: Admitting: Orthopedic Surgery

## 2024-06-22 ENCOUNTER — Other Ambulatory Visit (INDEPENDENT_AMBULATORY_CARE_PROVIDER_SITE_OTHER): Payer: Self-pay

## 2024-06-22 ENCOUNTER — Other Ambulatory Visit: Payer: Self-pay | Admitting: Orthopedic Surgery

## 2024-06-22 DIAGNOSIS — S52601A Unspecified fracture of lower end of right ulna, initial encounter for closed fracture: Secondary | ICD-10-CM

## 2024-06-22 DIAGNOSIS — Z8781 Personal history of (healed) traumatic fracture: Secondary | ICD-10-CM

## 2024-06-22 DIAGNOSIS — S52501A Unspecified fracture of the lower end of right radius, initial encounter for closed fracture: Secondary | ICD-10-CM | POA: Diagnosis not present

## 2024-06-22 DIAGNOSIS — Z9889 Other specified postprocedural states: Secondary | ICD-10-CM

## 2024-06-22 MED ORDER — OXYCODONE HCL 5 MG PO TABS: 5.0000 mg | ORAL_TABLET | Freq: Four times a day (QID) | ORAL | 0 refills | Status: DC | PRN

## 2024-06-22 MED ORDER — CYCLOBENZAPRINE HCL 10 MG PO TABS: 10.0000 mg | ORAL_TABLET | Freq: Three times a day (TID) | ORAL | 0 refills | Status: DC | PRN

## 2024-06-22 NOTE — Progress Notes (Signed)
   Kelsey Walton - 43 y.o. female MRN [id number]  Date of birth: 03-13-1981  Office Visit Note: Visit Date: 06/22/2024 PCP: Kelsey Buel HERO, MD Referred by: Kelsey Walton Mar*  Subjective:  HPI: Kelsey Walton is a 43 y.o. female who presents today for follow up 2 weeks status post right wrist distal radius fracture open reduction internal fixation.  Having some ongoing swelling and pain at the wrist, significant stiffness at the elbow from guarding.  Pertinent ROS were reviewed with the patient and found to be negative unless otherwise specified above in HPI.   Assessment & Plan: Visit Diagnoses:  1. Closed fracture of distal ends of right radius and ulna, initial encounter   2. S/P ORIF (open reduction internal fixation) fracture     Plan: X-rays obtained today show stable appearance of the distal radius hardware, remains well fixated in all planes.  She is developing some significant tightness at the digits and elbow, she will be seen by occupational therapy tomorrow to begin range of motion exercises of the elbow and digits.  Given the significant instability of her fracture, we will place a short arm cast today for additional 2 weeks.  This will also provide additional protection for the ongoing ulnar styloid fracture.    Follow-up: No follow-ups on file.   Meds & Orders: No orders of the defined types were placed in this encounter.   Orders Placed This Encounter  Procedures   XR Wrist Complete Right     Procedures: No procedures performed       Objective:   Vital Signs: LMP 05/28/2019 (Within Days)   Ortho Exam Right wrist: - Well-healing volar incision, sutures removed today, skin is well-approximated without erythema or drainage - Digital range of motion is limited secondary to pain, 5 cm from distal palmar crease with active flexion index through small finger, slightly improved passively - Sensation intact light touch in median/radial/ulnar distributions,  AIN/PIN/interosseous intact - Elbow range of motion is also limited secondary to pain, unable to achieve full extension today actively, slightly improved passively  Imaging: XR Wrist Complete Right Result Date: 06/22/2024 X-rays of the right wrist demonstrate stable appearance of the distal radius fracture with hardware fixation.  No evidence of hardware failure or migration.  Ulnar styloid fracture is once again visualized, well reduced    Kelsey Walton) Kelsey Walton, M.D. [facility], Hand Surgery

## 2024-06-23 ENCOUNTER — Ambulatory Visit: Admitting: Occupational Therapy

## 2024-06-23 ENCOUNTER — Telehealth: Payer: Self-pay

## 2024-06-23 ENCOUNTER — Encounter: Payer: Self-pay | Admitting: Occupational Therapy

## 2024-06-23 DIAGNOSIS — M79641 Pain in right hand: Secondary | ICD-10-CM

## 2024-06-23 DIAGNOSIS — R278 Other lack of coordination: Secondary | ICD-10-CM

## 2024-06-23 DIAGNOSIS — R29818 Other symptoms and signs involving the nervous system: Secondary | ICD-10-CM

## 2024-06-23 DIAGNOSIS — M6281 Muscle weakness (generalized): Secondary | ICD-10-CM

## 2024-06-23 DIAGNOSIS — R208 Other disturbances of skin sensation: Secondary | ICD-10-CM | POA: Diagnosis present

## 2024-06-23 DIAGNOSIS — R29898 Other symptoms and signs involving the musculoskeletal system: Secondary | ICD-10-CM | POA: Diagnosis present

## 2024-06-23 NOTE — Telephone Encounter (Signed)
 Patient called and stated she is almost out of pain medication. Only 7 left. Can you please send more in?

## 2024-06-23 NOTE — Therapy (Signed)
 OUTPATIENT OCCUPATIONAL THERAPY ORTHO TREATMENT  Patient Name: Kelsey Walton MRN: [id number] DOB:1981/05/08, 43 y.o., female Today's Date: 06/23/2024  PCP: Dr. Adina REFERRING PROVIDER: Dr. [name]  END OF SESSION:  OT End of Session - 06/23/24 1108     Visit Number 1    Number of Visits 16    Date for OT Re-Evaluation 08/20/24    Authorization Type  Medicaid    OT Start Time 1106    OT Stop Time 1145    OT Time Calculation (min) 39 min    Activity Tolerance Patient limited by pain    Behavior During Therapy [facility] for tasks assessed/performed         Past Medical History:  Diagnosis Date   Anxiety    Arthritis    Depression    PTSD (post-traumatic stress disorder)    Seizures (HCC) 09/26/2019   teeth clencing/ anxiety are symtoms of seizures   Past Surgical History:  Procedure Laterality Date   ABDOMINAL HYSTERECTOMY N/A 06/30/2019   Procedure: HYSTERECTOMY ABDOMINAL;  Surgeon: Jayne Vonn DEL, MD;  Location: [facility];  Service: Gynecology;  Laterality: N/A;   AUGMENTATION MAMMAPLASTY Bilateral 2008   saline   BILATERAL SALPINGECTOMY Bilateral 06/30/2019   Procedure: OPEN BILATERAL SALPINGECTOMY;  Surgeon: Jayne Vonn DEL, MD;  Location: [facility];  Service: Gynecology;  Laterality: Bilateral;   DILATION AND CURETTAGE OF UTERUS     OPEN REDUCTION INTERNAL FIXATION (ORIF) DISTAL RADIAL FRACTURE Right 06/08/2024   Procedure: OPEN REDUCTION INTERNAL FIXATION (ORIF) DISTAL RADIUS FRACTURE;  Surgeon: [name] Buster, MD;  Location: [facility];  Service: Orthopedics;  Laterality: Right;  RIGHT WRIST OPEN REDUCTION INTERNAL FIXATION DISTAL RADIUS AND POSSIBLE ULNAR STYLOID   TUBAL LIGATION     Patient Active Problem List   Diagnosis Date Noted   Fracture, Colles, right, closed 06/08/2024   S/P hysterectomy 06/30/2019   Breast mass, right 07/17/2015   ONSET DATE: 06/08/24  REFERRING DIAG:  Diagnosis  S52.501A,S52.601A (ICD-10-CM) - Closed fracture of distal ends  of right radius and ulna, initial encounter   THERAPY DIAG:  Other lack of coordination  Muscle weakness (generalized)  Pain in right hand  Other disturbances of skin sensation  Other symptoms and signs involving the musculoskeletal system  Other symptoms and signs involving the nervous system  Rationale for Evaluation and Treatment: Rehabilitation  SUBJECTIVE:   SUBJECTIVE STATEMENT: 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 In total, she works 5 jobs and is eager to get back to work.   Pt accompanied by: self  PERTINENT HISTORY: PMH: Anxiety, seizures, PTSD   OPERATIVE DIAGNOSIS: Right distal radius fracture with associated ulnar styloid fracture   PROCEDURE: Right distal radius fracture open reduction internal fixation  PRECAUTIONS: Other: splint, NWB RUE, cleared for gentle digital and wrist forearm A/ROM; TAPE ALLERGY  WEIGHT BEARING RESTRICTIONS: Yes NWB RUE  PAIN:  Are you having pain? Yes: NPRS scale: 6/10 Pain location: RUE Pain description: numbness and tingling; soreness across base of hand Aggravating factors: movement Relieving factors: medication  FALLS: Has patient fallen in last 6 months? Yes. Number of falls 1  LIVING ENVIRONMENT: Lives with: lives alone Lives in: House/apartment Stairs: Yes: External: 7 steps; bilateral but cannot reach both Has following equipment at home: none  PLOF: Independent; driving  PATIENT GOALS: get back to working and hunting  NEXT MD VISIT: 07/07/2024   OBJECTIVE:  Note: Objective measures were completed at Evaluation unless otherwise noted.  HAND DOMINANCE: Right  ADLs: WFL  FUNCTIONAL OUTCOME MEASURES: Quick Dash: 93.2 % disability with use of RUE   UPPER EXTREMITY ROM:     TBD once medically appropriate  HAND FUNCTION: TBD once medically appropriate  COORDINATION: TBD once medically  appropriate  SENSATION: Paresthesias including hypersensitivity to R hand  EDEMA: moderate  COGNITION: Overall cognitive status: Within functional limits for tasks assessed  OBSERVATIONS: Pt ambulates without use of AD. No loss of balance. The pt appears well kept and has R arm sling donned with small pillow between it and torso. Pt appears in severe pain with moderate swelling to R digits. Forearm cast in place. Good return demo of edema and ROM HEP.   TREATMENT:                                                                                                                             Treatment time not billed as insurance does not reimburse for treatment provided at evaluation.   OT educated pt on rehabilitation process and results of objective measures in relation to pt specific goals.   OT educated pt on R digit and elbow ROM exercises within forearm cast and need to complete frequently to reduce pain, edema, and stiffness.   OT educated pt on edema reduction techniques as noted in pt instructions as well as use of heat and contrast baths for decreased pain and increased ROM in digits and elbow.    PATIENT EDUCATION: Education details: OT role and POC; edema reduction; modalities for pain and swelling Person educated: Patient Education method: Explanation, Demonstration, Verbal cues, and Handouts Education comprehension: verbalized understanding, returned demonstration, verbal cues required, and needs further education  HOME EXERCISE PROGRAM: 06/23/2024: edema reduction; contrast baths, heat  GOALS:  SHORT TERM GOALS: Target date: 07/26/2024   I with inital HEP Baseline: Goal status: INITIAL  2.  I with splint wear, care and precautions following 1-2 weeks of wear to ensure proper fit Baseline:  Goal status: INITIAL  3.  Pt will demonstrate composite finger flexion for at least 90% for increased functional use during ADLs. Baseline:  Goal status: INITIAL  4.  Pt will  increase wrist flexion and extension by 15 from baseline for increased ease with ADLs. Baseline:  Goal status: INITIAL  LONG TERM GOALS: Target date: 08/20/2024    I with updated HEP Baseline:  Goal status: INITIAL  2.  Pt will demonstrate grip strength of at least 25 lbs for RUE for increased ease withIADLs. Baseline:  Goal status: INITIAL  3.  Pt will demonstrate improved RUE functional use as evidenced by improving Quick DASH score to Baseline:  Goal status: INITIAL  4.  Pt will demonstrate wrist flexion/ extension WFLs for ADLs/IADLs.  Baseline:  Goal status: INITIAL  5.  Pt will resume use of RUE as dominant hand at least 90% of the time with pain no  greater than 3/10. Baseline:  Goal status: INITIAL  ASSESSMENT:  CLINICAL IMPRESSION: Patient is a 43 y.o. female who was seen today for occupational therapy evaluation for treatment Right distal radius fracture open reduction internal fixation. Prior medical history includes anxiety, seizures, and PTSD. Pt presents with the performance deficits below. She can benefit from skilled occupational therapy to address these deficits in order to maximize pt's safety and I with ADLs/IADLs.  PERFORMANCE DEFICITS: in functional skills including ADLs, IADLs, coordination, sensation, edema, ROM, strength, pain, flexibility, Fine motor control, Gross motor control, mobility, balance, decreased knowledge of precautions, decreased knowledge of use of DME, and UE functional use.   IMPAIRMENTS: are limiting patient from ADLs, IADLs, rest and sleep, work, leisure, and social participation.   COMORBIDITIES: may have co-morbidities  that affects occupational performance. Patient will benefit from skilled OT to address above impairments and improve overall function.  MODIFICATION OR ASSISTANCE TO COMPLETE EVALUATION: Min-Moderate modification of tasks or assist with assess necessary to complete an evaluation.  OT OCCUPATIONAL PROFILE AND HISTORY:  Detailed assessment: Review of records and additional review of physical, cognitive, psychosocial history related to current functional performance.  CLINICAL DECISION MAKING: Moderate - several treatment options, min-mod task modification necessary  REHAB POTENTIAL: Fair given severity of sx  EVALUATION COMPLEXITY: Moderate     PLAN:  OT FREQUENCY: 1xweek until 8/13 and then 2 x week   OT DURATION: 8 weeks  PLANNED INTERVENTIONS: 97168 OT Re-evaluation, 97535 self care/ADL training, 02889 therapeutic exercise, 97530 therapeutic activity, 97112 neuromuscular re-education, 97140 manual therapy, 97035 ultrasound, 97018 paraffin, 02960 fluidotherapy, 97010 moist heat, 97010 cryotherapy, 97034 contrast bath, 97032 electrical stimulation (manual), 97760 Orthotic Initial, 97763 Orthotic/Prosthetic subsequent, scar mobilization, passive range of motion, functional mobility training, coping strategies training, patient/family education, and DME and/or AE instructions  RECOMMENDED OTHER SERVICES: N/A for this visit  CONSULTED AND AGREED WITH PLAN OF CARE: Patient  PLAN FOR NEXT SESSION: review pain and edema management; US ; desensitization   Jocelyn CHRISTELLA Bottom, OT 06/23/2024, 5:31 PM

## 2024-06-24 ENCOUNTER — Other Ambulatory Visit: Payer: Self-pay | Admitting: Orthopedic Surgery

## 2024-06-24 ENCOUNTER — Telehealth: Payer: Self-pay | Admitting: Orthopedic Surgery

## 2024-06-24 MED ORDER — OXYCODONE HCL 5 MG PO TABS: 5.0000 mg | ORAL_TABLET | Freq: Four times a day (QID) | ORAL | 0 refills | Status: DC | PRN

## 2024-06-24 NOTE — Telephone Encounter (Signed)
 Patient called and needs a refill on the Oxycodone . CB#[phone]

## 2024-06-24 NOTE — Telephone Encounter (Signed)
**Note De-identified  [place] Obfuscation** Please advise 

## 2024-06-25 ENCOUNTER — Other Ambulatory Visit: Payer: Self-pay | Admitting: Orthopedic Surgery

## 2024-06-25 ENCOUNTER — Telehealth: Payer: Self-pay | Admitting: Orthopedic Surgery

## 2024-06-25 MED ORDER — OXYCODONE HCL 5 MG PO TABS: 5.0000 mg | ORAL_TABLET | Freq: Four times a day (QID) | ORAL | 0 refills | Status: DC | PRN

## 2024-06-25 NOTE — Telephone Encounter (Signed)
 Patient called. Says the oxycodone  needs to be sent to [facility] on [address]. Her cb# [phone]

## 2024-06-25 NOTE — Telephone Encounter (Signed)
 I spoke with the pt. She stated her cast is bothering her. I offered for her to come to [facility] today for me to change it out but she declined. She would rather come in Monday. Nurse visit made for cast change for monday

## 2024-06-25 NOTE — Telephone Encounter (Signed)
 Message sent to Dr erwin to resend

## 2024-06-25 NOTE — Telephone Encounter (Signed)
 [facility] to [facility], called and advised pt

## 2024-06-28 ENCOUNTER — Ambulatory Visit (INDEPENDENT_AMBULATORY_CARE_PROVIDER_SITE_OTHER): Admitting: Orthopedic Surgery

## 2024-06-28 ENCOUNTER — Other Ambulatory Visit (INDEPENDENT_AMBULATORY_CARE_PROVIDER_SITE_OTHER): Payer: Self-pay

## 2024-06-28 DIAGNOSIS — Z8781 Personal history of (healed) traumatic fracture: Secondary | ICD-10-CM

## 2024-06-28 DIAGNOSIS — Z9889 Other specified postprocedural states: Secondary | ICD-10-CM

## 2024-06-28 NOTE — Progress Notes (Signed)
   Kelsey Walton - 43 y.o. female MRN [id number]  Date of birth: 12-15-1980  Office Visit Note: Visit Date: 06/28/2024 PCP: Adina Buel HERO, MD Referred by: Adina Buel HERO, MD  Subjective:  HPI: Rebacca L Walton is a 43 y.o. female who presents today for follow up 3 weeks status post right wrist distal radius fracture open reduction internal fixation.  Having some ongoing swelling and pain at the wrist, remains significant stiffness at the elbow from guarding.  Pertinent ROS were reviewed with the patient and found to be negative unless otherwise specified above in HPI.   Assessment & Plan: Visit Diagnoses:  1. S/P ORIF (open reduction internal fixation) fracture     Plan: X-rays obtained today show stable appearance of the distal radius hardware, remains well fixated in all planes.  She is continues to have some significant tightness at the digits and elbow, she will be seen by occupational therapy tomorrow to begin range of motion exercises of the elbow and digits.  Given the significant instability of her fracture, we will replace a short arm cast today for additional 2 weeks.  This will also provide additional protection for the ongoing ulnar styloid fracture as well.  Follow-up: No follow-ups on file.   Meds & Orders: No orders of the defined types were placed in this encounter.   Orders Placed This Encounter  Procedures   XR Wrist Complete Right   XR Elbow 2 Views Right     Procedures: No procedures performed       Objective:   Vital Signs: LMP 05/28/2019 (Within Days)   Ortho Exam Right wrist: - Well-healed volar incision, skin is well-approximated without erythema or drainage - Digital range of motion is limited secondary to pain, 5 cm from distal palmar crease with active flexion index through small finger, slightly improved passively - Sensation intact light touch in median/radial/ulnar distributions, AIN/PIN/interosseous intact - Elbow range of motion is also limited  secondary to pain, unable to achieve full extension today actively, slightly improved passively  Imaging: XR Elbow 2 Views Right Result Date: 06/28/2024 Elbow remains well located in all planes, no evidence of fracture or dislocation.  XR Wrist Complete Right Result Date: 06/28/2024 X-rays of the right wrist demonstrate stable appearance of the distal radius fracture with hardware fixation, well-fixed in all planes that evidence of failure or migration.  Ulnar styloid fracture is visualized, slight dorsal subluxation of the ulna in comparison to the distal radius.     Kelsey Walton, M.D. [facility], Hand Surgery

## 2024-07-01 ENCOUNTER — Ambulatory Visit: Attending: Family Medicine | Admitting: Occupational Therapy

## 2024-07-01 DIAGNOSIS — M79641 Pain in right hand: Secondary | ICD-10-CM | POA: Insufficient documentation

## 2024-07-01 DIAGNOSIS — M6281 Muscle weakness (generalized): Secondary | ICD-10-CM | POA: Insufficient documentation

## 2024-07-01 DIAGNOSIS — R208 Other disturbances of skin sensation: Secondary | ICD-10-CM | POA: Diagnosis present

## 2024-07-01 DIAGNOSIS — R29898 Other symptoms and signs involving the musculoskeletal system: Secondary | ICD-10-CM | POA: Insufficient documentation

## 2024-07-01 DIAGNOSIS — R29818 Other symptoms and signs involving the nervous system: Secondary | ICD-10-CM | POA: Diagnosis present

## 2024-07-01 DIAGNOSIS — R278 Other lack of coordination: Secondary | ICD-10-CM | POA: Diagnosis present

## 2024-07-01 NOTE — Patient Instructions (Signed)
 Swelling Reduction  Place affected area above the level of your heart. You can use pillows for support. Move the affected area. Think about tightening the muscles in this area and then relaxing them.  "Pet" the area from the outside of your body toward the center of your body. If it is your hand, from the tips of your fingers to the shoulder or chest.  CONTRAST BATHS  Definition-a form of alternating heat and cold therapy used to relieve swelling in the hand/arm or swelling within or around nerves & tendons.  It is used before, during, or after exercises or separately throughout the day.  It is important that you follow the directions specifically.  Instructions-Find a reliable way to apply heat (heating pad, rice sock, etc.)  And also way to apply cold (ice and freezer bag, cold pack, ball with ice water, etc.)  And set them up side-by-side on a table.  You will also need a timer or stopwatch.  Begin by placing your hand in the heat for 3 minutes.  After 3 minutes in the heat, remove your hand and transfer into the cold for 1 minute.  Repeat this process for 20 minutes (5 cycles of 3 min heat/1 min cold)  This can be done 1-3 times per day to help reduce swelling, pain, and promote movement in the wrist/fingers.   Heat Home Program Heat is used as part of your therapy for several reasons.  Heat increases blood flow, which promotes healing. Heat also relaxes muscles and joints which makes exercising easier.  Complete BEFORE exercise to help manage pain and increase stretch.  It can be applied for __10-15_ minutes,  _up to 3_ sessions per day  Use one of the following methods that is most convenient for you  Heating pad: Follow instructions given by manufacturer.  Use on low-medium setting.    Moist heated towel: Soak towel in hot water or place damp towel in microwave and heat for 30 sec. Check temperature to determine if further time needed.  Wring out towel and wrap around affected  area, cover with a plastic bag.  Can also wrap a dry towel around the plastic to help retain the heat.    Microwave gel pack: Follow instructions given by manufacturer.  Check temperature before application to ensure not hot enough to cause a burn    Rice sock: This can be made using a sock with no synthetic material and 1.5 cups of rice.  Pour the rice into the sock, tie a knot at the top.  Place in microwave for 1 minute, remove to check temperature to determine if further heating time is required prior to application  Be sure to check the skin periodically to ensure no excessive redness or blisters.  Use with caution in areas with decreased sensation

## 2024-07-01 NOTE — Therapy (Signed)
 OUTPATIENT OCCUPATIONAL THERAPY ORTHO EVALUATION  Patient Name: Kelsey Walton MRN: [id number] DOB:[birth year], 43 y.o., female Today's Date: 07/01/2024  PCP: Dr. Adina REFERRING PROVIDER: Dr. [name]  END OF SESSION:  OT End of Session - 07/01/24 1800     Visit Number 2    Number of Visits 16    Date for OT Re-Evaluation 08/20/24    Authorization Type [place] [employer]    OT Start Time 1616    OT Stop Time 1709    OT Time Calculation (min) 53 min    Activity Tolerance Patient limited by pain    Behavior During Therapy [facility] for tasks assessed/performed          Past Medical History:  Diagnosis Date   Anxiety    Arthritis    Depression    PTSD (post-traumatic stress disorder)    Seizures (HCC) 09/26/2019   teeth clencing/ anxiety are symtoms of seizures   Past Surgical History:  Procedure Laterality Date   ABDOMINAL HYSTERECTOMY N/A 06/30/2019   Procedure: HYSTERECTOMY ABDOMINAL;  Surgeon: Jayne Vonn DEL, MD;  Location: [facility];  Service: Gynecology;  Laterality: N/A;   AUGMENTATION MAMMAPLASTY Bilateral 2008   saline   BILATERAL SALPINGECTOMY Bilateral 06/30/2019   Procedure: OPEN BILATERAL SALPINGECTOMY;  Surgeon: Jayne Vonn DEL, MD;  Location: [facility];  Service: Gynecology;  Laterality: Bilateral;   DILATION AND CURETTAGE OF UTERUS     OPEN REDUCTION INTERNAL FIXATION (ORIF) DISTAL RADIAL FRACTURE Right 06/08/2024   Procedure: OPEN REDUCTION INTERNAL FIXATION (ORIF) DISTAL RADIUS FRACTURE;  Surgeon: [name] Buster, MD;  Location: [facility];  Service: Orthopedics;  Laterality: Right;  RIGHT WRIST OPEN REDUCTION INTERNAL FIXATION DISTAL RADIUS AND POSSIBLE ULNAR STYLOID   TUBAL LIGATION     Patient Active Problem List   Diagnosis Date Noted   Fracture, Colles, right, closed 06/08/2024   S/P hysterectomy 06/30/2019   Breast mass, right 07/17/2015    ONSET DATE: 06/08/24  REFERRING DIAG:  Diagnosis  S52.501A,S52.601A (ICD-10-CM) - Closed fracture of distal  ends of right radius and ulna, initial encounter    THERAPY DIAG:  Other lack of coordination  Muscle weakness (generalized)  Pain in right hand  Other disturbances of skin sensation  Other symptoms and signs involving the musculoskeletal system  Other symptoms and signs involving the nervous system  Rationale for Evaluation and Treatment: Rehabilitation  SUBJECTIVE:   SUBJECTIVE STATEMENT: Pt reports she had to have cast redone due to positioning of her wrist as well as irritation to palm of hand and radial styloid. She continues to have a lot of swelling and did not tolerate heat well. It feels like she needs to pop her fingers.   Pt accompanied by: self  PERTINENT HISTORY: PMH: Anxiety, seizures, PTSD   OPERATIVE DIAGNOSIS: Right distal radius fracture with associated ulnar styloid fracture   PROCEDURE: Right distal radius fracture open reduction internal fixation  PRECAUTIONS: Other: splint, NWB RUE, cleared for gentle digital and wrist forearm A/ROM; TAPE ALLERGY  WEIGHT BEARING RESTRICTIONS: Yes NWB RUE  PAIN:  Are you having pain? Yes: NPRS scale: 8/10 Pain location: RUE Pain description: numbness and tingling; soreness across base of hand Aggravating factors: movement Relieving factors: medication  FALLS: Has patient fallen in last 6 months? Yes. Number of falls 1  LIVING ENVIRONMENT: Lives with: lives alone Lives in: House/apartment Stairs: Yes: External: 7 steps; bilateral but cannot reach both Has following equipment at home: none  PLOF: Independent; driving  PATIENT GOALS: get back  to working and hunting  NEXT MD VISIT: 07/07/2024   OBJECTIVE:  Note: Objective measures were completed at Evaluation unless otherwise noted.  HAND DOMINANCE: Right  ADLs: WFL  FUNCTIONAL OUTCOME MEASURES: Quick Dash: 93.2 % disability with use of RUE   UPPER EXTREMITY ROM:     TBD once medically appropriate  HAND FUNCTION: TBD once medically  appropriate  COORDINATION: TBD once medically appropriate  SENSATION: Paresthesias including hypersensitivity to R hand  EDEMA: moderate  COGNITION: Overall cognitive status: Within functional limits for tasks assessed  OBSERVATIONS: Pt ambulates without use of AD. No loss of balance. The pt appears well kept and has R arm sling donned with small pillow between it and torso. Pt appears in severe pain with moderate swelling to R digits. Forearm cast in place. Good return demo of edema and ROM HEP.   TREATMENT:                                                                                                                             OT reviewed R digit, elbow, and shoulder ROM exercises within forearm cast and need to complete frequently to reduce pain, edema, and stiffness. She was able to complete using arm skate at table for greater ease. She was encouraged to hold RUE in more neutral position while walking. At this point, she does not need to wear her arm sling.   OT reviewed edema reduction techniques as noted in pt instructions as well as use of heat, contrast baths, and ultrasound for decreased pain and increased ROM in digits.  - Ultrasound completed for duration as noted below including:  Ultrasound applied to palmar and dorsal right digits for 8 minutes, frequency of 3 MHz, 20% duty cycle, and 1.1 W/cm with pt's arm placed on soft towel for promotion of ROM, edema reduction, and pain reduction in affected extremity. Pillowcase positioned between fingers and cast to prevent gel from getting into casted area. Pt reported she no longer felt the need to pop her fingers upon completion.   PATIENT EDUCATION: Education details:  edema reduction; modalities for pain and swelling including US ; ROM Person educated: Patient Education method: Explanation, Demonstration, and Verbal cues Education comprehension: verbalized understanding, returned demonstration, verbal cues required, and needs  further education  HOME EXERCISE PROGRAM: 06/23/2024: edema reduction; contrast baths, heat  GOALS:  SHORT TERM GOALS: Target date: 07/26/2024   I with inital HEP Baseline: Goal status: IN PROGRESS  2.  I with splint wear, care and precautions following 1-2 weeks of wear to ensure proper fit Baseline: casted Goal status: INITIAL  3.  Pt will demonstrate composite finger flexion to at least 90% for increased functional use during ADLs. Baseline: casted Goal status: INITIAL  4.  Pt will increase wrist flexion and extension by 15 from baseline for increased ease with ADLs. Baseline: casted Goal status: INITIAL  LONG TERM GOALS: Target date: 08/20/2024    I with updated HEP Baseline:  Goal status: INITIAL  2.  Pt will demonstrate grip strength of at least 25 lbs for RUE for increased ease withIADLs. Baseline: casted Goal status: INITIAL  3.  Pt will demonstrate improved RUE functional use as evidenced by improving Quick DASH score to 77.2 Baseline: 93.2% disability Goal status: INITIAL  4.  Pt will demonstrate wrist flexion/ extension WFLs for ADLs/IADLs.  Baseline: casted Goal status: INITIAL  5.  Pt will resume use of RUE as dominant hand at least 90% of the time with pain no greater than 3/10. Baseline: casted Goal status: INITIAL  ASSESSMENT:  CLINICAL IMPRESSION: Patient demonstrates good understanding of ROM exercises with improved digit abduction from eval. Good response to ultrasound as far as pain and edema are concerned. Will continue with use as needed to promote regulated lymphatic flow and ROM.   PERFORMANCE DEFICITS: in functional skills including ADLs, IADLs, coordination, sensation, edema, ROM, strength, pain, flexibility, Fine motor control, Gross motor control, mobility, balance, decreased knowledge of precautions, decreased knowledge of use of DME, and UE functional use.   IMPAIRMENTS: are limiting patient from ADLs, IADLs, rest and sleep, work,  leisure, and social participation.   COMORBIDITIES: may have co-morbidities  that affects occupational performance. Patient will benefit from skilled OT to address above impairments and improve overall function.  REHAB POTENTIAL: Fair given severity of sx  PLAN:  OT FREQUENCY: 1xweek until 8/13 and then 2 x week   OT DURATION: 8 weeks  PLANNED INTERVENTIONS: 97168 OT Re-evaluation, 97535 self care/ADL training, 02889 therapeutic exercise, 97530 therapeutic activity, 97112 neuromuscular re-education, 97140 manual therapy, 97035 ultrasound, 97018 paraffin, 02960 fluidotherapy, 97010 moist heat, 97010 cryotherapy, 97034 contrast bath, 97032 electrical stimulation (manual), 97760 Orthotic Initial, 97763 Orthotic/Prosthetic subsequent, scar mobilization, passive range of motion, functional mobility training, coping strategies training, patient/family education, and DME and/or AE instructions  RECOMMENDED OTHER SERVICES: N/A for this visit  CONSULTED AND AGREED WITH PLAN OF CARE: Patient  PLAN FOR NEXT SESSION: desensitization; US    Jocelyn CHRISTELLA Bottom, OT 07/01/2024, 6:02 PM

## 2024-07-07 ENCOUNTER — Ambulatory Visit: Admitting: Orthopedic Surgery

## 2024-07-07 ENCOUNTER — Other Ambulatory Visit (INDEPENDENT_AMBULATORY_CARE_PROVIDER_SITE_OTHER): Payer: Self-pay

## 2024-07-07 ENCOUNTER — Ambulatory Visit: Admitting: Occupational Therapy

## 2024-07-07 ENCOUNTER — Other Ambulatory Visit: Payer: Self-pay | Admitting: Orthopedic Surgery

## 2024-07-07 DIAGNOSIS — R208 Other disturbances of skin sensation: Secondary | ICD-10-CM

## 2024-07-07 DIAGNOSIS — Z9889 Other specified postprocedural states: Secondary | ICD-10-CM | POA: Diagnosis not present

## 2024-07-07 DIAGNOSIS — R29898 Other symptoms and signs involving the musculoskeletal system: Secondary | ICD-10-CM

## 2024-07-07 DIAGNOSIS — R278 Other lack of coordination: Secondary | ICD-10-CM

## 2024-07-07 DIAGNOSIS — M79641 Pain in right hand: Secondary | ICD-10-CM

## 2024-07-07 DIAGNOSIS — M6281 Muscle weakness (generalized): Secondary | ICD-10-CM

## 2024-07-07 DIAGNOSIS — Z8781 Personal history of (healed) traumatic fracture: Secondary | ICD-10-CM

## 2024-07-07 DIAGNOSIS — R29818 Other symptoms and signs involving the nervous system: Secondary | ICD-10-CM

## 2024-07-07 MED ORDER — CYCLOBENZAPRINE HCL 10 MG PO TABS: 10.0000 mg | ORAL_TABLET | Freq: Three times a day (TID) | ORAL | 0 refills | Status: DC | PRN

## 2024-07-07 MED ORDER — OXYCODONE HCL 5 MG PO TABS: 5.0000 mg | ORAL_TABLET | Freq: Four times a day (QID) | ORAL | 0 refills | Status: DC | PRN

## 2024-07-07 MED ORDER — FLUCONAZOLE 150 MG PO TABS: 150.0000 mg | ORAL_TABLET | Freq: Every day | ORAL | 0 refills | Status: AC

## 2024-07-07 NOTE — Patient Instructions (Signed)
  You can place your arms out to the sides with pillows underneath or place a pillow across your stomach with your hands resting on top for support.   Hold a pillow with your arms away from your body.  Do not bend your arms at the elbows or tuck your hands under your head. Do not place your hand on top or underneath pillow. Use outside of pillow as a border.    Desensitization program  The most effective way to diminish hypersensitivity is by repeated touching of the sensitive area.  The following program will assist in the desensitization process and should be performed 5 to 10 minutes of each hour you are awake:  1.  Beginning with the texture which causes the least discomfort, rub the sensitive area lightly for 10 minutes or until area feels numb and no longer sensitive.  2.  In an hour, return to the same texture and rub as before.  However, if this texture seems to cause no abnormal feelings, it is time to progress to a rougher texture.  Do not return to the softer texture; continue to progress through the list until you complete it.  It is important to be very consistent with this treatment.  The closer the program is followed, the faster you will find relief of your symptoms.  1.  Fur/cotton balls 2.  Flannel cloth 3.  Cotton fabric 4.  Denim fabric 5.  Burlap 6.  Raw peas/beans 7.  Raw rice 8.  Uncooked macaroni 9.  Metal, i.e. paperclips 10.  Tapping on edge of table 11.  Vibration

## 2024-07-07 NOTE — Therapy (Signed)
 OUTPATIENT OCCUPATIONAL THERAPY ORTHO TREATMENT  Patient Name: Kelsey Walton MRN: [id number] DOB:1981-07-28, 43 y.o., female Today's Date: 07/07/2024  PCP: Dr. Adina REFERRING PROVIDER: Dr. [name]  END OF SESSION:  OT End of Session - 07/07/24 1110     Visit Number 3    Number of Visits 16    Date for OT Re-Evaluation 08/20/24    Authorization Type [place] Medicaid    OT Start Time 1117    OT Stop Time 1255    OT Time Calculation (min) 98 min    Activity Tolerance Patient limited by pain    Behavior During Therapy [facility] for tasks assessed/performed          Past Medical History:  Diagnosis Date   Anxiety    Arthritis    Depression    PTSD (post-traumatic stress disorder)    Seizures (HCC) 09/26/2019   teeth clencing/ anxiety are symtoms of seizures   Past Surgical History:  Procedure Laterality Date   ABDOMINAL HYSTERECTOMY N/A 06/30/2019   Procedure: HYSTERECTOMY ABDOMINAL;  Surgeon: Jayne Vonn DEL, MD;  Location: [facility];  Service: Gynecology;  Laterality: N/A;   AUGMENTATION MAMMAPLASTY Bilateral 2008   saline   BILATERAL SALPINGECTOMY Bilateral 06/30/2019   Procedure: OPEN BILATERAL SALPINGECTOMY;  Surgeon: Jayne Vonn DEL, MD;  Location: [facility];  Service: Gynecology;  Laterality: Bilateral;   DILATION AND CURETTAGE OF UTERUS     OPEN REDUCTION INTERNAL FIXATION (ORIF) DISTAL RADIAL FRACTURE Right 06/08/2024   Procedure: OPEN REDUCTION INTERNAL FIXATION (ORIF) DISTAL RADIUS FRACTURE;  Surgeon: [name] Buster, MD;  [facility];  Service: Orthopedics;  Laterality: Right;  RIGHT WRIST OPEN REDUCTION INTERNAL FIXATION DISTAL RADIUS AND POSSIBLE ULNAR STYLOID   TUBAL LIGATION     Patient Active Problem List   Diagnosis Date Noted   Fracture, Colles, right, closed 06/08/2024   S/P hysterectomy 06/30/2019   Breast mass, right 07/17/2015    ONSET DATE: 06/08/24 - date of surgery  REFERRING DIAG:  Diagnosis  S52.501A,S52.601A (ICD-10-CM) - Closed  fracture of distal ends of right radius and ulna, initial encounter    THERAPY DIAG:  Other lack of coordination  Muscle weakness (generalized)  Pain in right hand  Other disturbances of skin sensation  Other symptoms and signs involving the musculoskeletal system  Other symptoms and signs involving the nervous system  Rationale for Evaluation and Treatment: Rehabilitation  SUBJECTIVE:   SUBJECTIVE STATEMENT: Pt reports benefit from US  last session. Hypersensitivity at R thumb and dorsally at palm.   Pt accompanied by: self  PERTINENT HISTORY: PMH: Anxiety, seizures, PTSD   OPERATIVE DIAGNOSIS: Right distal radius fracture with associated ulnar styloid fracture   PROCEDURE: Right distal radius fracture open reduction internal fixation  PRECAUTIONS: Other: IHP - pg 292 and 293; no lifting; TAPE ALLERGY  WEIGHT BEARING RESTRICTIONS: Yes NWB RUE  PAIN:  Are you having pain? Yes: NPRS scale: 7/10 Pain location: RUE Pain description: numbness and tingling; soreness across base of hand Aggravating factors: movement Relieving factors: medication  FALLS: Has patient fallen in last 6 months? Yes. Number of falls 1  LIVING ENVIRONMENT: Lives with: lives alone Lives in: House/apartment Stairs: Yes: External: 7 steps; bilateral but cannot reach both Has following equipment at home: none  PLOF: Independent; driving  PATIENT GOALS: get back to working and hunting  NEXT MD VISIT: 07/07/2024   OBJECTIVE:  Note: Objective measures were completed at Evaluation unless otherwise noted.  HAND DOMINANCE: Right  ADLs: [facility]  FUNCTIONAL  OUTCOME MEASURES: Quick Dash: 93.2 % disability with use of RUE   UPPER EXTREMITY ROM:     TBD once medically appropriate  HAND FUNCTION: TBD once medically appropriate  COORDINATION: TBD once medically appropriate  SENSATION: Paresthesias including hypersensitivity to R hand  EDEMA: moderate  COGNITION: Overall cognitive  status: Within functional limits for tasks assessed  OBSERVATIONS: Pt ambulates without use of AD. No loss of balance. The pt appears well kept and has R arm sling donned with small pillow between it and torso. Pt appears in severe pain with moderate swelling to R digits. Forearm cast in place. Good return demo of edema and ROM HEP.   TREATMENT:                                                                                                                            - Self-care/home management completed for duration as noted below including: OT educated patient on sleep positioning as noted in patient instructions to reduce stress to upper extremity nerves, which could be attributing to reported paresthesias and pain in affected extremity. Patient verbalized understanding. Handout provided.  OT educated pt on desensitization techniques as noted in pt instructions for management of hypersensitivity.   OT educated pt on and had her complete hand hygiene following cast removal. Discussed bathing strategies for skin integrity.   - Orthotic Fit:   Pt was fitted with a right custom fabricated [place] splint placing forearm in neutral rotation and wrist in more extended (near neutral) position following a right distal radius and ulnar styloid fractures with ORIF on 06/08/2024. Pt was educated to wear the splint at all times for protection except for ADLs and HEP completion as well as splinting use, care, and precautions.   Stockinette cut and provided to pt in various widths and compression for comfort and edema management.   PATIENT EDUCATION: Education details:  edema reduction; desensitization; sleep positioning; splint wear and care Person educated: Patient Education method: Explanation, Demonstration, Verbal cues, and Handouts Education comprehension: verbalized understanding, returned demonstration, verbal cues required, and needs further education  HOME EXERCISE PROGRAM: 06/23/2024: edema  reduction; contrast baths, heat 07/07/2024: sleep positioning; desensitization  GOALS:  SHORT TERM GOALS: Target date: 07/26/2024   I with inital HEP Baseline: Goal status: IN PROGRESS  2.  I with splint wear, care and precautions following 1-2 weeks of wear to ensure proper fit Baseline: casted Goal status: INITIAL  3.  Pt will demonstrate composite finger flexion to at least 90% for increased functional use during ADLs. Baseline: casted Goal status: INITIAL  4.  Pt will increase wrist flexion and extension by 15 from baseline for increased ease with ADLs. Baseline: casted Goal status: INITIAL  LONG TERM GOALS: Target date: 08/20/2024    I with updated HEP Baseline:  Goal status: INITIAL  2.  Pt will demonstrate grip strength of at least 25 lbs for RUE for increased ease withIADLs. Baseline: casted Goal status: INITIAL  3.  Pt will demonstrate improved RUE functional use as evidenced by improving Quick DASH score to 77.2 Baseline: 93.2% disability Goal status: INITIAL  4.  Pt will demonstrate wrist flexion/ extension WFLs for ADLs/IADLs.  Baseline: casted Goal status: INITIAL  5.  Pt will resume use of RUE as dominant hand at least 90% of the time with pain no greater than 3/10. Baseline: casted Goal status: INITIAL  ASSESSMENT:  CLINICAL IMPRESSION: Patient was seen today for fabrication of [place] splint s/p R distal radius and ulna fractures with ORIF. This splint was chosen by ortho to provide extra stability while allowing full elbow ROM. Pt now 4 weeks post-op. Plan to transition to clam shell based splint in 2 weeks. Slight ulnar subluxation limiting pt comfort with supination; however, no concern with this plane of motion causing additional injury. Gradual wrist extension will be completed to pt tolerance given hypersensitivity and stage of recovery.   PERFORMANCE DEFICITS: in functional skills including ADLs, IADLs, coordination, sensation, edema, ROM,  strength, pain, flexibility, Fine motor control, Gross motor control, mobility, balance, decreased knowledge of precautions, decreased knowledge of use of DME, and UE functional use.   IMPAIRMENTS: are limiting patient from ADLs, IADLs, rest and sleep, work, leisure, and social participation.   COMORBIDITIES: may have co-morbidities  that affects occupational performance. Patient will benefit from skilled OT to address above impairments and improve overall function.  REHAB POTENTIAL: Fair given severity of sx  PLAN:  OT FREQUENCY: 1xweek until 8/13 and then 2 x week   OT DURATION: 8 weeks  PLANNED INTERVENTIONS: 97168 OT Re-evaluation, 97535 self care/ADL training, 02889 therapeutic exercise, 97530 therapeutic activity, 97112 neuromuscular re-education, 97140 manual therapy, 97035 ultrasound, 97018 paraffin, 02960 fluidotherapy, 97010 moist heat, 97010 cryotherapy, 97034 contrast bath, 97032 electrical stimulation (manual), 97760 Orthotic Initial, 97763 Orthotic/Prosthetic subsequent, scar mobilization, passive range of motion, functional mobility training, coping strategies training, patient/family education, and DME and/or AE instructions  RECOMMENDED OTHER SERVICES: N/A for this visit  CONSULTED AND AGREED WITH PLAN OF CARE: Patient  PLAN FOR NEXT SESSION: review desensitization; US ; ROM per protocol  Jocelyn CHRISTELLA Bottom, OT 07/07/2024, 1:09 PM

## 2024-07-07 NOTE — Progress Notes (Signed)
   Kelsey Walton - 43 y.o. female MRN [id number]  Date of birth: Dec 16, 1980  Office Visit Note: Visit Date: 07/07/2024 PCP: Adina Buel HERO, MD Referred by: Adina Buel HERO, MD  Subjective:  HPI: Kelsey Walton is a 43 y.o. female who presents today for follow up 4 weeks status post  right wrist distal radius fracture open reduction internal fixation.  She is improving from a digital range of motion standpoint, continues to have some ongoing stiffness at the elbow region and hesitancy with pronation and supination of the forearm.  Pertinent ROS were reviewed with the patient and found to be negative unless otherwise specified above in HPI.   Assessment & Plan: Visit Diagnoses:  1. S/P ORIF (open reduction internal fixation) fracture     Plan: She is improving clinically.  X-rays continue to show stable appearance of the distal radius fracture with hardware fixation.  There is some interval healing of the ulnar styloid fracture as well seen radiographically.  She will be transition today to a removable wrist brace with occupational therapy and be encouraged to continue with range of motion exercises of the digit, wrist and hand.  Have also emphasized the importance of pronation and supination for her with therapy moving forward both active and passive.  Refrain from weightbearing at this time.  Return in 2 weeks for clinical and radiographic recheck.  Follow-up: No follow-ups on file.   Meds & Orders: No orders of the defined types were placed in this encounter.   Orders Placed This Encounter  Procedures   XR Wrist Complete Right     Procedures: No procedures performed       Objective:   Vital Signs: LMP 05/28/2019 (Within Days)   Ortho Exam Right wrist: - Well-healed volar incision - Digital range of motion remains limited, approximately 5 cm of distal palmar crease with active flexion with composite fist - Digits are warm and well-perfused, sensation intact in all distributions  median/radial/ulnar, AIN/PIN/interosseous intact - Pronation/supination is limited secondary to pain, 50/10  Imaging: XR Wrist Complete Right Result Date: 07/07/2024 Stable appearance of the distal radius fracture with hardware fixation, no evidence of hardware failure or migration.  Appropriate oval healing of the ulnar styloid fracture is well-visualized.  Slight dorsal subluxation of the distal radioulnar joint is seen on the lateral view.    Kelsey Walton, M.D. [facility], Hand Surgery

## 2024-07-14 ENCOUNTER — Ambulatory Visit: Admitting: Occupational Therapy

## 2024-07-16 ENCOUNTER — Ambulatory Visit: Admitting: Occupational Therapy

## 2024-07-16 DIAGNOSIS — R29818 Other symptoms and signs involving the nervous system: Secondary | ICD-10-CM

## 2024-07-16 DIAGNOSIS — R278 Other lack of coordination: Secondary | ICD-10-CM

## 2024-07-16 DIAGNOSIS — R29898 Other symptoms and signs involving the musculoskeletal system: Secondary | ICD-10-CM

## 2024-07-16 DIAGNOSIS — R208 Other disturbances of skin sensation: Secondary | ICD-10-CM

## 2024-07-16 DIAGNOSIS — M6281 Muscle weakness (generalized): Secondary | ICD-10-CM

## 2024-07-16 DIAGNOSIS — M79641 Pain in right hand: Secondary | ICD-10-CM

## 2024-07-16 NOTE — Therapy (Signed)
 OUTPATIENT OCCUPATIONAL THERAPY ORTHO TREATMENT  Patient Name: Kelsey Walton MRN: [id number] DOB:08/25/1981, 43 y.o., female Today's Date: 07/16/2024  PCP: Dr. Adina REFERRING PROVIDER: Dr. [name]  END OF SESSION:  OT End of Session - 07/16/24 1113     Visit Number 4    Number of Visits 16    Date for OT Re-Evaluation 08/20/24    Authorization Type [place] [employer]    OT Start Time 1112    OT Stop Time 1150    OT Time Calculation (min) 38 min    Activity Tolerance Patient limited by pain    Behavior During Therapy [facility] for tasks assessed/performed         Past Medical History:  Diagnosis Date   Anxiety    Arthritis    Depression    PTSD (post-traumatic stress disorder)    Seizures (HCC) 09/26/2019   teeth clencing/ anxiety are symtoms of seizures   Past Surgical History:  Procedure Laterality Date   ABDOMINAL HYSTERECTOMY N/A 06/30/2019   Procedure: HYSTERECTOMY ABDOMINAL;  Surgeon: Jayne Vonn DEL, MD;  Location: [facility];  Service: Gynecology;  Laterality: N/A;   AUGMENTATION MAMMAPLASTY Bilateral 2008   saline   BILATERAL SALPINGECTOMY Bilateral 06/30/2019   Procedure: OPEN BILATERAL SALPINGECTOMY;  Surgeon: Jayne Vonn DEL, MD;  Location: [facility];  Service: Gynecology;  Laterality: Bilateral;   DILATION AND CURETTAGE OF UTERUS     OPEN REDUCTION INTERNAL FIXATION (ORIF) DISTAL RADIAL FRACTURE Right 06/08/2024   Procedure: OPEN REDUCTION INTERNAL FIXATION (ORIF) DISTAL RADIUS FRACTURE;  Surgeon: [name] Buster, MD;  Location:  SURGERY CENTER;  Service: Orthopedics;  Laterality: Right;  RIGHT WRIST OPEN REDUCTION INTERNAL FIXATION DISTAL RADIUS AND POSSIBLE ULNAR STYLOID   TUBAL LIGATION     Patient Active Problem List   Diagnosis Date Noted   Fracture, Colles, right, closed 06/08/2024   S/P hysterectomy 06/30/2019   Breast mass, right 07/17/2015    ONSET DATE: 06/08/24 - date of surgery  REFERRING DIAG:  Diagnosis  S52.501A,S52.601A (ICD-10-CM) - Closed  fracture of distal ends of right radius and ulna, initial encounter    THERAPY DIAG:  Other lack of coordination  Muscle weakness (generalized)  Pain in right hand  Other disturbances of skin sensation  Other symptoms and signs involving the musculoskeletal system  Other symptoms and signs involving the nervous system  Rationale for Evaluation and Treatment: Rehabilitation  SUBJECTIVE:   SUBJECTIVE STATEMENT: Pt reports she does not like her splint. She would like to be able to go without a splint for longer periods of time.   Pt accompanied by: self  PERTINENT HISTORY: PMH: Anxiety, seizures, PTSD   OPERATIVE DIAGNOSIS: Right distal radius fracture with associated ulnar styloid fracture   PROCEDURE: Right distal radius fracture open reduction internal fixation  PRECAUTIONS: Other: IHP - pg 292 and 293; no lifting; TAPE ALLERGY  WEIGHT BEARING RESTRICTIONS: Yes NWB RUE  PAIN:  Are you having pain? Yes: NPRS scale: 7/10 Pain location: RUE Pain description: numbness and tingling; soreness across base of hand Aggravating factors: movement Relieving factors: medication  FALLS: Has patient fallen in last 6 months? Yes. Number of falls 1  LIVING ENVIRONMENT: Lives with: lives alone Lives in: House/apartment Stairs: Yes: External: 7 steps; bilateral but cannot reach both Has following equipment at home: none  PLOF: Independent; driving  PATIENT GOALS: get back to working and hunting  NEXT MD VISIT: 07/07/2024   OBJECTIVE:  Note: Objective measures were completed at Evaluation unless otherwise noted.  HAND DOMINANCE: Right  ADLs: WFL  FUNCTIONAL OUTCOME MEASURES: Quick Dash: 93.2 % disability with use of RUE   UPPER EXTREMITY ROM:     TBD once medically appropriate  HAND FUNCTION: TBD once medically appropriate  COORDINATION: TBD once medically appropriate  SENSATION: Paresthesias including hypersensitivity to R hand  EDEMA:  moderate  COGNITION: Overall cognitive status: Within functional limits for tasks assessed  OBSERVATIONS: Pt ambulates without use of AD. No loss of balance. The pt appears well kept and has R arm sling donned with small pillow between it and torso. Pt appears in severe pain with moderate swelling to R digits. Forearm cast in place. Good return demo of edema and ROM HEP.   TREATMENT:                                                                                                                            - Self-care/home management completed for duration as noted below including: Wearing schedule for splint reviewed. ROM exercises reviewed for supination, wrist extension, and digit flexion.    - Ultrasound completed for duration as noted below including:  Ultrasound applied to palmar and dorsal right hand, digits, and wrist for 10 minutes, frequency of 3 MHz, 20% duty cycle, and 1.1 W/cm with pt's arm placed on soft towel for promotion of ROM, edema reduction, and pain reduction in affected extremity. PROM for supination provided during palmar aspect. OT educated pt on how to complete stretch at home.   - Orthotic Fit subsequent:   Adjustments made to [place] splint to allow for comfort over ulnar styloid, around thumb, and to allow full digit flexion.   PATIENT EDUCATION: Education details:  ROM; desensitization; splint wear and care Walton educated: Patient Education method: Explanation, Demonstration, and Verbal cues Education comprehension: verbalized understanding, returned demonstration, verbal cues required, and needs further education  HOME EXERCISE PROGRAM: 06/23/2024: edema reduction; contrast baths, heat 07/07/2024: sleep positioning; desensitization  GOALS:  SHORT TERM GOALS: Target date: 07/26/2024   I with inital HEP Baseline: Goal status: IN PROGRESS  2.  I with splint wear, care and precautions following 1-2 weeks of wear to ensure proper fit Baseline: casted Goal  status: INITIAL  3.  Pt will demonstrate composite finger flexion to at least 90% for increased functional use during ADLs. Baseline: casted Goal status: INITIAL  4.  Pt will increase wrist flexion and extension by 15 from baseline for increased ease with ADLs. Baseline: casted Goal status: INITIAL  LONG TERM GOALS: Target date: 08/20/2024    I with updated HEP Baseline:  Goal status: INITIAL  2.  Pt will demonstrate grip strength of at least 25 lbs for RUE for increased ease withIADLs. Baseline: casted Goal status: INITIAL  3.  Pt will demonstrate improved RUE functional use as evidenced by improving Quick DASH score to 77.2 Baseline: 93.2% disability Goal status: INITIAL  4.  Pt will demonstrate wrist flexion/ extension WFLs for ADLs/IADLs.  Baseline: casted Goal  status: INITIAL  5.  Pt will resume use of RUE as dominant hand at least 90% of the time with pain no greater than 3/10. Baseline: casted Goal status: INITIAL  ASSESSMENT:  CLINICAL IMPRESSION: Patient demonstrates limitations with edema, supination, wrist ext., and digit opposition and flexion. Recommend pain and edema management as well as focus on ROM to progress towards goals.  PERFORMANCE DEFICITS: in functional skills including ADLs, IADLs, coordination, sensation, edema, ROM, strength, pain, flexibility, Fine motor control, Gross motor control, mobility, balance, decreased knowledge of precautions, decreased knowledge of use of DME, and UE functional use.   IMPAIRMENTS: are limiting patient from ADLs, IADLs, rest and sleep, work, leisure, and social participation.   COMORBIDITIES: may have co-morbidities  that affects occupational performance. Patient will benefit from skilled OT to address above impairments and improve overall function.  REHAB POTENTIAL: Fair given severity of sx  PLAN:  OT FREQUENCY: 1xweek until 8/13 and then 2 x week   OT DURATION: 8 weeks  PLANNED INTERVENTIONS: 97168 OT  Re-evaluation, 97535 self care/ADL training, 02889 therapeutic exercise, 97530 therapeutic activity, 97112 neuromuscular re-education, 97140 manual therapy, 97035 ultrasound, 97018 paraffin, 02960 fluidotherapy, 97010 moist heat, 97010 cryotherapy, 97034 contrast bath, 97032 electrical stimulation (manual), 97760 Orthotic Initial, 97763 Orthotic/Prosthetic subsequent, scar mobilization, passive range of motion, functional mobility training, coping strategies training, patient/family education, and DME and/or AE instructions  RECOMMENDED OTHER SERVICES: N/A for this visit  CONSULTED AND AGREED WITH PLAN OF CARE: Patient  PLAN FOR NEXT SESSION: review desensitization; ROM per protocol  Jocelyn CHRISTELLA Bottom, OT 07/16/2024, 11:28 AM

## 2024-07-19 ENCOUNTER — Ambulatory Visit: Admitting: Occupational Therapy

## 2024-07-21 ENCOUNTER — Other Ambulatory Visit: Payer: Self-pay | Admitting: Orthopedic Surgery

## 2024-07-21 ENCOUNTER — Other Ambulatory Visit (INDEPENDENT_AMBULATORY_CARE_PROVIDER_SITE_OTHER): Payer: Self-pay

## 2024-07-21 ENCOUNTER — Ambulatory Visit: Admitting: Occupational Therapy

## 2024-07-21 ENCOUNTER — Telehealth: Payer: Self-pay | Admitting: Orthopedic Surgery

## 2024-07-21 ENCOUNTER — Ambulatory Visit (INDEPENDENT_AMBULATORY_CARE_PROVIDER_SITE_OTHER): Admitting: Orthopedic Surgery

## 2024-07-21 DIAGNOSIS — R278 Other lack of coordination: Secondary | ICD-10-CM | POA: Diagnosis not present

## 2024-07-21 DIAGNOSIS — M6281 Muscle weakness (generalized): Secondary | ICD-10-CM

## 2024-07-21 DIAGNOSIS — Z8781 Personal history of (healed) traumatic fracture: Secondary | ICD-10-CM

## 2024-07-21 DIAGNOSIS — R208 Other disturbances of skin sensation: Secondary | ICD-10-CM

## 2024-07-21 DIAGNOSIS — Z9889 Other specified postprocedural states: Secondary | ICD-10-CM

## 2024-07-21 DIAGNOSIS — M79641 Pain in right hand: Secondary | ICD-10-CM

## 2024-07-21 DIAGNOSIS — R29898 Other symptoms and signs involving the musculoskeletal system: Secondary | ICD-10-CM

## 2024-07-21 DIAGNOSIS — R29818 Other symptoms and signs involving the nervous system: Secondary | ICD-10-CM

## 2024-07-21 MED ORDER — CYCLOBENZAPRINE HCL 10 MG PO TABS: 10.0000 mg | ORAL_TABLET | Freq: Three times a day (TID) | ORAL | 0 refills | Status: DC | PRN

## 2024-07-21 NOTE — Telephone Encounter (Signed)
 Patient called and said that her therapist said she needs to be talking her muscle relaxers for pain when she comes back for therapy. She stated can you send it [facility] [address] in [place]. [phone]

## 2024-07-21 NOTE — Progress Notes (Signed)
   Kelsey Walton - 43 y.o. female MRN [id number]  Date of birth: August 14, 1981  Office Visit Note: Visit Date: 07/21/2024 PCP: Adina Buel HERO, MD Referred by: Adina Buel HERO, MD  Subjective:  HPI: Kelsey Walton is a 43 y.o. female who presents today for follow up 6 weeks status post right wrist distal radius fracture open reduction internal fixation.  She is having ongoing soreness and stiffness at the wrist level, range of motion remains restricted with supination in particular.  Pertinent ROS were reviewed with the patient and found to be negative unless otherwise specified above in HPI.   Assessment & Plan: Visit Diagnoses:  1. S/P ORIF (open reduction internal fixation) fracture     Plan: She continues to have significant guarding and stiffness throughout the elbow and wrist region.  I did recommend that we downsize her splint to free up her elbow and encourage elbow range of motion.  Our emphasis will be on rotation of the forearm as well given her significant soreness and stiffness in this region.  Her wrist does remain stable to stress testing.  Her digital range of motion is improving.  I will plan on seeing her back in 2 to 3 weeks to track her progress.  I did emphasize to her the importance of therapy moving forward.  We will give her a refill of her muscle relaxers as this seemed to be helping.  Follow-up: No follow-ups on file.   Meds & Orders: No orders of the defined types were placed in this encounter.   Orders Placed This Encounter  Procedures   XR Wrist Complete Right     Procedures: No procedures performed       Objective:   Vital Signs: LMP 05/28/2019 (Within Days)   Ortho Exam Right wrist: - Well-healed volar incision - Digital range of motion remains limited, approximately 1.5 cm of distal palmar crease with active flexion with composite fist - Digits are warm and well-perfused, sensation intact in all distributions median/radial/ulnar, AIN/PIN/interosseous  intact - Pronation/supination is limited secondary to pain, 50/10 - Elbow range of motion 30/120  Imaging: XR Wrist Complete Right Result Date: 07/21/2024 Stable appearance of the distal radius fracture with hardware fixation, no evidence of hardware failure or migration.      Kelsey Walton, M.D. [facility], Hand Surgery

## 2024-07-21 NOTE — Therapy (Signed)
 OUTPATIENT OCCUPATIONAL THERAPY ORTHO TREATMENT  Patient Name: Kelsey Walton MRN: [id number] DOB:1981/03/28, 43 y.o., female Today's Date: 07/21/2024  PCP: Dr. Adina REFERRING PROVIDER: Dr. [name]  END OF SESSION:  OT End of Session - 07/21/24 1632     Visit Number 5    Number of Visits 16    Date for OT Re-Evaluation 08/20/24    Authorization Type [place] [employer]    OT Start Time 1235    OT Stop Time 1320    OT Time Calculation (min) 45 min    Activity Tolerance Patient limited by pain    Behavior During Therapy Anxious         Past Medical History:  Diagnosis Date   Anxiety    Arthritis    Depression    PTSD (post-traumatic stress disorder)    Seizures (HCC) 09/26/2019   teeth clencing/ anxiety are symtoms of seizures   Past Surgical History:  Procedure Laterality Date   ABDOMINAL HYSTERECTOMY N/A 06/30/2019   Procedure: HYSTERECTOMY ABDOMINAL;  Surgeon: Jayne Vonn DEL, MD;  Location: [facility];  Service: Gynecology;  Laterality: N/A;   AUGMENTATION MAMMAPLASTY Bilateral 2008   saline   BILATERAL SALPINGECTOMY Bilateral 06/30/2019   Procedure: OPEN BILATERAL SALPINGECTOMY;  Surgeon: Jayne Vonn DEL, MD;  Location: [facility];  Service: Gynecology;  Laterality: Bilateral;   DILATION AND CURETTAGE OF UTERUS     OPEN REDUCTION INTERNAL FIXATION (ORIF) DISTAL RADIAL FRACTURE Right 06/08/2024   Procedure: OPEN REDUCTION INTERNAL FIXATION (ORIF) DISTAL RADIUS FRACTURE;  Surgeon: [name] Buster, MD;  Location: [facility];  Service: Orthopedics;  Laterality: Right;  RIGHT WRIST OPEN REDUCTION INTERNAL FIXATION DISTAL RADIUS AND POSSIBLE ULNAR STYLOID   TUBAL LIGATION     Patient Active Problem List   Diagnosis Date Noted   Fracture, Colles, right, closed 06/08/2024   S/P hysterectomy 06/30/2019   Breast mass, right 07/17/2015    ONSET DATE: 06/08/24 - date of surgery  REFERRING DIAG:  Diagnosis  S52.501A,S52.601A (ICD-10-CM) - Closed fracture of distal ends of  right radius and ulna, initial encounter    THERAPY DIAG:  Other lack of coordination  Muscle weakness (generalized)  Pain in right hand  Other disturbances of skin sensation  Other symptoms and signs involving the musculoskeletal system  Other symptoms and signs involving the nervous system  Rationale for Evaluation and Treatment: Rehabilitation  SUBJECTIVE:   SUBJECTIVE STATEMENT: I thought we were just going to cut down this one. Pt arrived with pre-fab wrist brace and previously issued fabricated muenster splint  Pt accompanied by: self  PERTINENT HISTORY: PMH: Anxiety, seizures, PTSD   OPERATIVE DIAGNOSIS: Right distal radius fracture with associated ulnar styloid fracture   PROCEDURE: Right distal radius fracture open reduction internal fixation  PRECAUTIONS: Other: IHP - pg 292 and 293; no lifting; TAPE ALLERGY  WEIGHT BEARING RESTRICTIONS: Yes NWB RUE  PAIN:  Are you having pain? Yes: NPRS scale: 7/10 Pain location: RUE Pain description: numbness and tingling; soreness across base of hand Aggravating factors: movement Relieving factors: medication  FALLS: Has patient fallen in last 6 months? Yes. Number of falls 1  LIVING ENVIRONMENT: Lives with: lives alone Lives in: House/apartment Stairs: Yes: External: 7 steps; bilateral but cannot reach both Has following equipment at home: none  PLOF: Independent; driving  PATIENT GOALS: get back to working and hunting  NEXT MD VISIT: 07/07/2024   OBJECTIVE:  Note: Objective measures were completed at Evaluation unless otherwise noted.  HAND DOMINANCE: Right  ADLs:  WFL  FUNCTIONAL OUTCOME MEASURES: Quick Dash: 93.2 % disability with use of RUE   UPPER EXTREMITY ROM:     TBD once medically appropriate  HAND FUNCTION: TBD once medically appropriate  COORDINATION: TBD once medically appropriate  SENSATION: Paresthesias including hypersensitivity to R hand  EDEMA:  moderate  COGNITION: Overall cognitive status: Within functional limits for tasks assessed  OBSERVATIONS: Pt ambulates without use of AD. No loss of balance. The pt appears well kept and has R arm sling donned with small pillow between it and torso. Pt appears in severe pain with moderate swelling to R digits. Forearm cast in place. Good return demo of edema and ROM HEP.   TREATMENT:             MD office reached out via Teams to request wrist splint (reducing muenster to free elbow) however, felt pt required better fit w/ wrist position (increase extension to neutral) and stability at wrist d/t both forearm fx's, therefore fabricated clam shell splint for wrist. Pt educated in wear and care  Pt shown A/ROM, P/ROM and place and hold ex for FA supination, followed by A/ROM and P/ROM for digit motion including: MP flex, IP flex, composite flexion, and coming up into full extension b/t each ex. Also worked on elbow flex/ext w/ cues to reduce sh compensations w/ elbow extension.                                                                                                                  PATIENT EDUCATION: Education details:  see above Person educated: Patient Education method: Explanation, Demonstration, and Verbal cues Education comprehension: verbalized understanding, returned demonstration, verbal cues required, and needs further education  HOME EXERCISE PROGRAM: 06/23/2024: edema reduction; contrast baths, heat 07/07/2024: sleep positioning; desensitization  GOALS:  SHORT TERM GOALS: Target date: 07/26/2024   I with inital HEP Baseline: Goal status: IN PROGRESS  2.  I with splint wear, care and precautions following 1-2 weeks of wear to ensure proper fit Baseline: casted Goal status: IN PROGRESS  3.  Pt will demonstrate composite finger flexion to at least 90% for increased functional use during ADLs. Baseline: casted Goal status: IN PROGRESS  4.  Pt will increase wrist flexion  and extension by 15 from baseline for increased ease with ADLs. Baseline: casted Goal status: INITIAL  LONG TERM GOALS: Target date: 08/20/2024    I with updated HEP Baseline:  Goal status: INITIAL  2.  Pt will demonstrate grip strength of at least 25 lbs for RUE for increased ease withIADLs. Baseline: casted Goal status: INITIAL  3.  Pt will demonstrate improved RUE functional use as evidenced by improving Quick DASH score to 77.2 Baseline: 93.2% disability Goal status: INITIAL  4.  Pt will demonstrate wrist flexion/ extension WFLs for ADLs/IADLs.  Baseline: casted Goal status: INITIAL  5.  Pt will resume use of RUE as dominant hand at least 90% of the time with pain no greater than 3/10. Baseline: casted Goal status: INITIAL  ASSESSMENT:  CLINICAL IMPRESSION: Patient demonstrates limitations with edema, supination, wrist ext., and digit opposition and flexion. Limited by pain and decreased tolerance to ROM.   PERFORMANCE DEFICITS: in functional skills including ADLs, IADLs, coordination, sensation, edema, ROM, strength, pain, flexibility, Fine motor control, Gross motor control, mobility, balance, decreased knowledge of precautions, decreased knowledge of use of DME, and UE functional use.   IMPAIRMENTS: are limiting patient from ADLs, IADLs, rest and sleep, work, leisure, and social participation.   COMORBIDITIES: may have co-morbidities  that affects occupational performance. Patient will benefit from skilled OT to address above impairments and improve overall function.  REHAB POTENTIAL: Fair given severity of sx  PLAN:  OT FREQUENCY: 1xweek until 8/13 and then 2 x week   OT DURATION: 8 weeks  PLANNED INTERVENTIONS: 97168 OT Re-evaluation, 97535 self care/ADL training, 02889 therapeutic exercise, 97530 therapeutic activity, 97112 neuromuscular re-education, 97140 manual therapy, 97035 ultrasound, 97018 paraffin, 02960 fluidotherapy, 97010 moist heat, 97010  cryotherapy, 97034 contrast bath, 97032 electrical stimulation (manual), 97760 Orthotic Initial, 97763 Orthotic/Prosthetic subsequent, scar mobilization, passive range of motion, functional mobility training, coping strategies training, patient/family education, and DME and/or AE instructions  RECOMMENDED OTHER SERVICES: N/A for this visit  CONSULTED AND AGREED WITH PLAN OF CARE: Patient  PLAN FOR NEXT SESSION: Splint adjustments prn, issue formal HEP for A/ROM and P/ROM to elbow, forearm, digits, thumb; and active wrist extension   Burnard JINNY Roads, OT 07/21/2024, 4:33 PM

## 2024-07-22 ENCOUNTER — Other Ambulatory Visit: Payer: Self-pay | Admitting: Orthopedic Surgery

## 2024-07-22 MED ORDER — OXYCODONE HCL 5 MG PO TABS: 5.0000 mg | ORAL_TABLET | Freq: Four times a day (QID) | ORAL | 0 refills | Status: DC | PRN

## 2024-07-27 ENCOUNTER — Ambulatory Visit: Attending: Family Medicine | Admitting: Occupational Therapy

## 2024-07-27 DIAGNOSIS — M25641 Stiffness of right hand, not elsewhere classified: Secondary | ICD-10-CM | POA: Insufficient documentation

## 2024-07-27 DIAGNOSIS — M79641 Pain in right hand: Secondary | ICD-10-CM | POA: Insufficient documentation

## 2024-07-27 DIAGNOSIS — R29898 Other symptoms and signs involving the musculoskeletal system: Secondary | ICD-10-CM | POA: Insufficient documentation

## 2024-07-27 DIAGNOSIS — M6281 Muscle weakness (generalized): Secondary | ICD-10-CM | POA: Insufficient documentation

## 2024-07-27 DIAGNOSIS — R208 Other disturbances of skin sensation: Secondary | ICD-10-CM | POA: Insufficient documentation

## 2024-07-27 DIAGNOSIS — R278 Other lack of coordination: Secondary | ICD-10-CM | POA: Insufficient documentation

## 2024-07-27 DIAGNOSIS — R29818 Other symptoms and signs involving the nervous system: Secondary | ICD-10-CM | POA: Insufficient documentation

## 2024-07-28 ENCOUNTER — Telehealth: Payer: Self-pay | Admitting: Occupational Therapy

## 2024-07-28 NOTE — Telephone Encounter (Signed)
 Called and spoke to patient re: yesterday's missed O.T. appt on 07/27/24, and reminded her of her next appt tomorrow, 07/29/24 at 11:00. Pt reports she overdid it this weekend and hand to take pain meds and slept through her appt yesterday. Pt was reminded that she needs to call in the future if she is unable to make appt. She verbalized understanding

## 2024-07-29 ENCOUNTER — Ambulatory Visit: Admitting: Occupational Therapy

## 2024-07-29 DIAGNOSIS — M79641 Pain in right hand: Secondary | ICD-10-CM | POA: Diagnosis present

## 2024-07-29 DIAGNOSIS — R208 Other disturbances of skin sensation: Secondary | ICD-10-CM

## 2024-07-29 DIAGNOSIS — R29898 Other symptoms and signs involving the musculoskeletal system: Secondary | ICD-10-CM | POA: Diagnosis present

## 2024-07-29 DIAGNOSIS — M6281 Muscle weakness (generalized): Secondary | ICD-10-CM | POA: Diagnosis present

## 2024-07-29 DIAGNOSIS — R29818 Other symptoms and signs involving the nervous system: Secondary | ICD-10-CM

## 2024-07-29 DIAGNOSIS — R278 Other lack of coordination: Secondary | ICD-10-CM

## 2024-07-29 DIAGNOSIS — M25641 Stiffness of right hand, not elsewhere classified: Secondary | ICD-10-CM | POA: Diagnosis present

## 2024-07-29 NOTE — Therapy (Signed)
 OUTPATIENT OCCUPATIONAL THERAPY ORTHO TREATMENT  Patient Name: Kelsey Walton MRN: [id number] DOB:October 30, 1981, 43 y.o., female Today's Date: 07/29/2024  PCP: Dr. Adina REFERRING PROVIDER: Dr. [name]  END OF SESSION:  OT End of Session - 07/29/24 1239     Visit Number 6    Number of Visits 16    Date for OT Re-Evaluation 08/20/24    Authorization Type [place] Medicaid    OT Start Time 1105    OT Stop Time 1220    OT Time Calculation (min) 75 min    Activity Tolerance Patient limited by pain         Past Medical History:  Diagnosis Date   Anxiety    Arthritis    Depression    PTSD (post-traumatic stress disorder)    Seizures (HCC) 09/26/2019   teeth clencing/ anxiety are symtoms of seizures   Past Surgical History:  Procedure Laterality Date   ABDOMINAL HYSTERECTOMY N/A 06/30/2019   Procedure: HYSTERECTOMY ABDOMINAL;  Surgeon: Jayne Vonn DEL, MD;  Location: [facility];  Service: Gynecology;  Laterality: N/A;   AUGMENTATION MAMMAPLASTY Bilateral 2008   saline   BILATERAL SALPINGECTOMY Bilateral 06/30/2019   Procedure: OPEN BILATERAL SALPINGECTOMY;  Surgeon: Jayne Vonn DEL, MD;  Location: [facility];  Service: Gynecology;  Laterality: Bilateral;   DILATION AND CURETTAGE OF UTERUS     OPEN REDUCTION INTERNAL FIXATION (ORIF) DISTAL RADIAL FRACTURE Right 06/08/2024   Procedure: OPEN REDUCTION INTERNAL FIXATION (ORIF) DISTAL RADIUS FRACTURE;  Surgeon: [name] Buster, MD;  Location: [facility];  Service: Orthopedics;  Laterality: Right;  RIGHT WRIST OPEN REDUCTION INTERNAL FIXATION DISTAL RADIUS AND POSSIBLE ULNAR STYLOID   TUBAL LIGATION     Patient Active Problem List   Diagnosis Date Noted   Fracture, Colles, right, closed 06/08/2024   S/P hysterectomy 06/30/2019   Breast mass, right 07/17/2015    ONSET DATE: 06/08/24 - date of surgery  REFERRING DIAG:  Diagnosis  S52.501A,S52.601A (ICD-10-CM) - Closed fracture of distal ends of right radius and ulna, initial encounter     THERAPY DIAG:  Other lack of coordination  Muscle weakness (generalized)  Pain in right hand  Other disturbances of skin sensation  Other symptoms and signs involving the musculoskeletal system  Other symptoms and signs involving the nervous system  Rationale for Evaluation and Treatment: Rehabilitation  SUBJECTIVE:   SUBJECTIVE STATEMENT: I didn't come Tuesday because I had overdone it this weekend and I couldn't move my fingers after sleeping in the more compressive stockinette. I had to take a pain pill and then slept the majority of the day  Pt accompanied by: self  PERTINENT HISTORY: PMH: Anxiety, seizures, PTSD   OPERATIVE DIAGNOSIS: Right distal radius fracture with associated ulnar styloid fracture   PROCEDURE: Right distal radius fracture open reduction internal fixation  PRECAUTIONS: Other: IHP - pg 292 and 293; no lifting; TAPE ALLERGY  WEIGHT BEARING RESTRICTIONS: Yes NWB RUE  PAIN:  Are you having pain? Yes: NPRS scale: 7/10 Pain location: RUE Pain description: numbness and tingling; soreness across base of hand Aggravating factors: movement Relieving factors: medication  FALLS: Has patient fallen in last 6 months? Yes. Number of falls 1  LIVING ENVIRONMENT: Lives with: lives alone Lives in: House/apartment Stairs: Yes: External: 7 steps; bilateral but cannot reach both Has following equipment at home: none  PLOF: Independent; driving  PATIENT GOALS: get back to working and hunting  NEXT MD VISIT: 07/07/2024   OBJECTIVE:  Note: Objective measures were completed at Evaluation  unless otherwise noted.  HAND DOMINANCE: Right  ADLs: WFL  FUNCTIONAL OUTCOME MEASURES: Quick Dash: 93.2 % disability with use of RUE   UPPER EXTREMITY ROM:     TBD once medically appropriate  HAND FUNCTION: TBD once medically appropriate  COORDINATION: TBD once medically appropriate  SENSATION: Paresthesias including hypersensitivity to R  hand  EDEMA: moderate  COGNITION: Overall cognitive status: Within functional limits for tasks assessed  OBSERVATIONS: Pt ambulates without use of AD. No loss of balance. The pt appears well kept and has R arm sling donned with small pillow between it and torso. Pt appears in severe pain with moderate swelling to R digits. Forearm cast in place. Good return demo of edema and ROM HEP.   TREATMENT:             Adjusted custom clam shell splint to add strap more distally for greater comfort/fit.  Pt also shown how to adjust prefab wrist brace for greater fit/comfort: therapist removed dorsal metal stay, slightly adjusted volar stay for more wrist extension, and placed 3rd stay slightly more ulnarly to prevent ulnar deviation. Pt reports it felt better drawing string tighter and strap b/t thumb slightly tighter to take up extra space dorsally.   Pt issued formal HEP for A/ROM and P/ROM for fingers, thumb, wrist, forearm supination, and elbow - see pt instructions for details. Pt/therapist reviewed each one extensively w/ recommendations to begin with A/ROM followed by passive place and hold ex's.  Pt return demo  Pt encouraged to begin trying to eat and write with Rt dominant hand and using hand for non weighted, non resistive tasks (assisting with buttons of shirt, holding a piece of paper, empty cup, etc). Pt cautioned NOT to use for anything weighted/resistive and for wt bearing activities at this time. Emphasized that focus right now is to regain ROM for functional tasks   Pt reports she is climbing tree stand for hunting but not using RUE, only LUE. Pt advised not to do this right now for safety concerns however pt claims she is very careful.   07/29/24 A/ROM as follows:   Wrist ext and supination to neutral w/ effort Elbow ext = -25* Thumb opposition to 4th digit Gross composite finger flexion approx 50-60%                                                                                                               PATIENT EDUCATION: Education details:  see above Person educated: Patient Education method: Explanation, Demonstration, Tactile cues, Verbal cues, and Handouts Education comprehension: verbalized understanding, returned demonstration, verbal cues required, tactile cues required, and needs further education  HOME EXERCISE PROGRAM: 06/23/2024: edema reduction; contrast baths, heat 07/07/2024: sleep positioning; desensitization 07/29/24: formal HEP for A/ROM and P/ROM  GOALS:  SHORT TERM GOALS: Target date: 07/26/2024   I with inital HEP Baseline: Goal status: IN PROGRESS  2.  I with splint wear, care and precautions following 1-2 weeks of wear to ensure proper fit Baseline: casted Goal status: IN PROGRESS  3.  Pt will demonstrate composite finger flexion to at least 90% for increased functional use during ADLs. Baseline: casted Goal status: IN PROGRESS  4.  Pt will increase wrist flexion and extension by 15 from baseline for increased ease with ADLs. Baseline: casted Goal status: INITIAL  LONG TERM GOALS: Target date: 08/20/2024    I with updated HEP Baseline:  Goal status: INITIAL  2.  Pt will demonstrate grip strength of at least 25 lbs for RUE for increased ease withIADLs. Baseline: casted Goal status: INITIAL  3.  Pt will demonstrate improved RUE functional use as evidenced by improving Quick DASH score to 77.2 Baseline: 93.2% disability Goal status: INITIAL  4.  Pt will demonstrate wrist flexion/ extension WFLs for ADLs/IADLs.  Baseline: casted Goal status: INITIAL  5.  Pt will resume use of RUE as dominant hand at least 90% of the time with pain no greater than 3/10. Baseline: casted Goal status: INITIAL  ASSESSMENT:  CLINICAL IMPRESSION: Patient demonstrates limitations with edema, supination, wrist ext., and digit opposition and flexion. Limited by pain and decreased tolerance to ROM, however pt with improved tolerance this session to  ROM/stretches with some improvement noted.   PERFORMANCE DEFICITS: in functional skills including ADLs, IADLs, coordination, sensation, edema, ROM, strength, pain, flexibility, Fine motor control, Gross motor control, mobility, balance, decreased knowledge of precautions, decreased knowledge of use of DME, and UE functional use.   IMPAIRMENTS: are limiting patient from ADLs, IADLs, rest and sleep, work, leisure, and social participation.   COMORBIDITIES: may have co-morbidities  that affects occupational performance. Patient will benefit from skilled OT to address above impairments and improve overall function.  REHAB POTENTIAL: Fair given severity of sx  PLAN:  OT FREQUENCY: 1xweek until 8/13 and then 2 x week   OT DURATION: 8 weeks  PLANNED INTERVENTIONS: 97168 OT Re-evaluation, 97535 self care/ADL training, 02889 therapeutic exercise, 97530 therapeutic activity, 97112 neuromuscular re-education, 97140 manual therapy, 97035 ultrasound, 97018 paraffin, 02960 fluidotherapy, 97010 moist heat, 97010 cryotherapy, 97034 contrast bath, 97032 electrical stimulation (manual), 97760 Orthotic Initial, 97763 Orthotic/Prosthetic subsequent, scar mobilization, passive range of motion, functional mobility training, coping strategies training, patient/family education, and DME and/or AE instructions  RECOMMENDED OTHER SERVICES: N/A for this visit  CONSULTED AND AGREED WITH PLAN OF CARE: Patient  PLAN FOR NEXT SESSION: continue with aggressive A/ROM and P/ROM as tolerated. Show elbow extension ex against gravity.  (Begin putty ex's once cleared by MD)    Burnard JINNY Roads, OT 07/29/2024, 12:40 PM

## 2024-07-29 NOTE — Patient Instructions (Signed)
  Flexor Tendon Gliding (Active Hook Fist)   With fingers and knuckles straight, bend middle and tip joints. Do not bend large knuckles. Repeat _10-15___ times. Do _4-6___ sessions per day.  MP Flexion (Active)   With back of hand on table, bend large knuckles as far as they will go, keeping small joints straight. Repeat _10-15___ times. Do __4-6__ sessions per day. Activity: Reach into a narrow container.*      Finger Flexion / Extension   With palm up, bend fingers of left hand toward palm, making a  fist. Straighten fingers, opening fist. Repeat sequence _10-15___ times per session. Do _4-6__ sessions per day. Hand Variation: Palm down   Opposition (Active)   Touch tip of thumb to nail tip of each finger in turn, making an O shape. Repeat __10__ times. Do _4-6___ sessions per day.   AROM: Thumb Abduction / Adduction    Actively pull right thumb away from palm as far as possible.  Then bring thumb back to touch fingers. Try not to bend fingers toward thumb. Repeat _10-15___ times per set.  Do __4-6__ sessions per day.    MP Flexion (Active)   Bend thumb to touch base of little finger, keeping tip joint straight. Repeat __10-15__ times. Do _4-6___ sessions per day    Composite Extension (Active)   Bring thumb up and out in hitchhiker position.  Repeat __10-15__ times. Do _4-6___ sessions per day.   IP Flexion (Active Blocked)   Brace thumb below tip joint. Bend joint as far as possible. Repeat __10__ times. Do _4-6___ sessions per day.    Extension (Passive)    Place thick telephone book on table and rest upper arm on it. Grasp forearm with other hand and use a steady downward and outward pull to straighten elbow. Hold __10__ seconds. Repeat _5___ times. Do __4-6_ sessions per day.  Supination (Active)    With elbow held at right angle and kept at side, turn palm upward as far as possible. Hold 5 sec Repeat _10___ times. Do __6__ sessions per  day.   Supination (Passive)    Keep elbow bent at right angle and held firmly at side. Use other hand to turn forearm until palm faces upward. Hold _10___ seconds. Repeat __5__ times. Do __6__ sessions per day.  AROM: Wrist Extension    With right palm down, bend wrist up. Repeat __10__ times per set.  Do __4-6__ sessions per day.   Extension (Passive)    Using other hand, lift hand at wrist as far as possible. Hold _10___ seconds. Repeat _5___ times. Do __6_ sessions per day.

## 2024-08-03 ENCOUNTER — Ambulatory Visit: Admitting: Occupational Therapy

## 2024-08-03 DIAGNOSIS — R29898 Other symptoms and signs involving the musculoskeletal system: Secondary | ICD-10-CM

## 2024-08-03 DIAGNOSIS — R29818 Other symptoms and signs involving the nervous system: Secondary | ICD-10-CM

## 2024-08-03 DIAGNOSIS — M6281 Muscle weakness (generalized): Secondary | ICD-10-CM

## 2024-08-03 DIAGNOSIS — M79641 Pain in right hand: Secondary | ICD-10-CM

## 2024-08-03 DIAGNOSIS — R208 Other disturbances of skin sensation: Secondary | ICD-10-CM

## 2024-08-03 DIAGNOSIS — R278 Other lack of coordination: Secondary | ICD-10-CM

## 2024-08-03 DIAGNOSIS — M25641 Stiffness of right hand, not elsewhere classified: Secondary | ICD-10-CM | POA: Diagnosis not present

## 2024-08-03 NOTE — Therapy (Unsigned)
 OUTPATIENT OCCUPATIONAL THERAPY ORTHO TREATMENT  Patient Name: Kelsey Walton MRN: [id number] DOB:1981-03-21, 43 y.o., female Today's Date: 08/03/2024  PCP: Dr. Adina REFERRING PROVIDER: Dr. [name]  END OF SESSION:  OT End of Session - 08/03/24 1236     Visit Number 7    Number of Visits 16    Date for OT Re-Evaluation 08/20/24    Authorization Type [place] Medicaid    OT Start Time 1235    OT Stop Time 1320    OT Time Calculation (min) 45 min    Activity Tolerance Patient limited by pain    Behavior During Therapy [facility] for tasks assessed/performed         Past Medical History:  Diagnosis Date   Anxiety    Arthritis    Depression    PTSD (post-traumatic stress disorder)    Seizures (HCC) 09/26/2019   teeth clencing/ anxiety are symtoms of seizures   Past Surgical History:  Procedure Laterality Date   ABDOMINAL HYSTERECTOMY N/A 06/30/2019   Procedure: HYSTERECTOMY ABDOMINAL;  Surgeon: Jayne Vonn DEL, MD;  Location: [facility];  Service: Gynecology;  Laterality: N/A;   AUGMENTATION MAMMAPLASTY Bilateral 2008   saline   BILATERAL SALPINGECTOMY Bilateral 06/30/2019   Procedure: OPEN BILATERAL SALPINGECTOMY;  Surgeon: Jayne Vonn DEL, MD;  Location: [facility];  Service: Gynecology;  Laterality: Bilateral;   DILATION AND CURETTAGE OF UTERUS     OPEN REDUCTION INTERNAL FIXATION (ORIF) DISTAL RADIAL FRACTURE Right 06/08/2024   Procedure: OPEN REDUCTION INTERNAL FIXATION (ORIF) DISTAL RADIUS FRACTURE;  Surgeon: [name] Buster, MD;  Location: [facility];  Service: Orthopedics;  Laterality: Right;  RIGHT WRIST OPEN REDUCTION INTERNAL FIXATION DISTAL RADIUS AND POSSIBLE ULNAR STYLOID   TUBAL LIGATION     Patient Active Problem List   Diagnosis Date Noted   Fracture, Colles, right, closed 06/08/2024   S/P hysterectomy 06/30/2019   Breast mass, right 07/17/2015    ONSET DATE: 06/08/24 - date of surgery  REFERRING DIAG:  Diagnosis  S52.501A,S52.601A (ICD-10-CM) - Closed  fracture of distal ends of right radius and ulna, initial encounter    THERAPY DIAG:  Other lack of coordination  Muscle weakness (generalized)  Pain in right hand  Other disturbances of skin sensation  Other symptoms and signs involving the musculoskeletal system  Other symptoms and signs involving the nervous system  Rationale for Evaluation and Treatment: Rehabilitation  SUBJECTIVE:   SUBJECTIVE STATEMENT: Pt reports discomfort with brace at palm/base of middle digits.   Pt accompanied by: self  PERTINENT HISTORY: PMH: Anxiety, seizures, PTSD   OPERATIVE DIAGNOSIS: Right distal radius fracture with associated ulnar styloid fracture   PROCEDURE: Right distal radius fracture open reduction internal fixation  PRECAUTIONS: Other: IHP - pg 292 and 293; no lifting; TAPE ALLERGY  WEIGHT BEARING RESTRICTIONS: Yes NWB RUE  PAIN:  Are you having pain? Yes: NPRS scale: 7/10 Pain location: RUE Pain description: numbness and tingling; soreness across base of hand Aggravating factors: movement Relieving factors: medication  FALLS: Has patient fallen in last 6 months? Yes. Number of falls 1  LIVING ENVIRONMENT: Lives with: lives alone Lives in: House/apartment Stairs: Yes: External: 7 steps; bilateral but cannot reach both Has following equipment at home: none  PLOF: Independent; driving  PATIENT GOALS: get back to working and hunting  NEXT MD VISIT: 08/04/2024   OBJECTIVE:  Note: Objective measures were completed at Evaluation unless otherwise noted.  HAND DOMINANCE: Right  ADLs: WFL  FUNCTIONAL OUTCOME MEASURES: Quick Dash: 93.2 %  disability with use of RUE   UPPER EXTREMITY ROM:     TBD once medically appropriate  HAND FUNCTION: TBD once medically appropriate  COORDINATION: TBD once medically appropriate  SENSATION: Paresthesias including hypersensitivity to R hand  EDEMA: moderate  COGNITION: Overall cognitive status: Within functional  limits for tasks assessed  OBSERVATIONS: Pt ambulates without use of AD. No loss of balance. The pt appears well kept and has R arm sling donned with small pillow between it and torso. Pt appears in severe pain with moderate swelling to R digits. Forearm cast in place. Good return demo of edema and ROM HEP.   TREATMENT:            - Self-care/home management completed for duration as noted below including:  Adjusted custom clam shell splint to add additional space at palm for greater comfort/fit.   Education provided for alternative stretching of R elbow into extension including use of heating pad and completion of stretch at doorway or in supine.   - Therapeutic exercises completed for duration as noted below including:  Paraffin wax used with Coban holding R digits in flexion with thumb tucked  and wrist in neutral to slight extension for static stretch. OT then completed PROM of R digits, wrist, and forearm for additional stretch.     PATIENT EDUCATION: Education details:  see above Person educated: Patient Education method: Explanation, Demonstration, Tactile cues, and Verbal cues Education comprehension: verbalized understanding, returned demonstration, verbal cues required, tactile cues required, and needs further education  HOME EXERCISE PROGRAM: 06/23/2024: edema reduction; contrast baths, heat 07/07/2024: sleep positioning; desensitization 07/29/24: formal HEP for A/ROM and P/ROM  GOALS:  SHORT TERM GOALS: Target date: 07/26/2024   I with inital HEP Baseline: Goal status: IN PROGRESS  2.  I with splint wear, care and precautions following 1-2 weeks of wear to ensure proper fit Baseline: casted Goal status: IN PROGRESS  3.  Pt will demonstrate composite finger flexion to at least 90% for increased functional use during ADLs. Baseline: casted Goal status: IN PROGRESS  4.  Pt will increase wrist flexion and extension by 15 from baseline for increased ease with  ADLs. Baseline: casted Goal status: INITIAL  LONG TERM GOALS: Target date: 08/20/2024    I with updated HEP Baseline:  Goal status: INITIAL  2.  Pt will demonstrate grip strength of at least 25 lbs for RUE for increased ease withIADLs. Baseline: casted Goal status: INITIAL  3.  Pt will demonstrate improved RUE functional use as evidenced by improving Quick DASH score to 77.2 Baseline: 93.2% disability Goal status: INITIAL  4.  Pt will demonstrate wrist flexion/ extension WFLs for ADLs/IADLs.  Baseline: casted Goal status: INITIAL  5.  Pt will resume use of RUE as dominant hand at least 90% of the time with pain no greater than 3/10. Baseline: casted Goal status: INITIAL  ASSESSMENT:  CLINICAL IMPRESSION: Patient demonstrates good tolerance with static stretch, wax, and PROM combination this visit. Still limited by supination ROM at R forearm.   PERFORMANCE DEFICITS: in functional skills including ADLs, IADLs, coordination, sensation, edema, ROM, strength, pain, flexibility, Fine motor control, Gross motor control, mobility, balance, decreased knowledge of precautions, decreased knowledge of use of DME, and UE functional use.   IMPAIRMENTS: are limiting patient from ADLs, IADLs, rest and sleep, work, leisure, and social participation.   COMORBIDITIES: may have co-morbidities  that affects occupational performance. Patient will benefit from skilled OT to address above impairments and improve overall function.  REHAB POTENTIAL: Fair given severity of sx  PLAN:  OT FREQUENCY: 1xweek until 8/13 and then 2 x week   OT DURATION: 8 weeks  PLANNED INTERVENTIONS: 97168 OT Re-evaluation, 97535 self care/ADL training, 02889 therapeutic exercise, 97530 therapeutic activity, 97112 neuromuscular re-education, 97140 manual therapy, 97035 ultrasound, 97018 paraffin, 02960 fluidotherapy, 97010 moist heat, 97010 cryotherapy, 97034 contrast bath, 97032 electrical stimulation (manual), 97760  Orthotic Initial, 97763 Orthotic/Prosthetic subsequent, scar mobilization, passive range of motion, functional mobility training, coping strategies training, patient/family education, and DME and/or AE instructions  RECOMMENDED OTHER SERVICES: N/A for this visit  CONSULTED AND AGREED WITH PLAN OF CARE: Patient  PLAN FOR NEXT SESSION: continue with aggressive A/ROM and P/ROM as tolerated. Show elbow extension ex against gravity.  (Begin putty ex's once cleared by MD)    Coban, paraffin, and PROM combo  Jocelyn CHRISTELLA Bottom, OT 08/03/2024, 5:58 PM

## 2024-08-04 ENCOUNTER — Ambulatory Visit: Admitting: Orthopedic Surgery

## 2024-08-04 ENCOUNTER — Other Ambulatory Visit: Payer: Self-pay | Admitting: Family Medicine

## 2024-08-04 ENCOUNTER — Other Ambulatory Visit (INDEPENDENT_AMBULATORY_CARE_PROVIDER_SITE_OTHER): Payer: Self-pay

## 2024-08-04 DIAGNOSIS — Z1231 Encounter for screening mammogram for malignant neoplasm of breast: Secondary | ICD-10-CM

## 2024-08-04 DIAGNOSIS — Z9889 Other specified postprocedural states: Secondary | ICD-10-CM

## 2024-08-04 DIAGNOSIS — Z8781 Personal history of (healed) traumatic fracture: Secondary | ICD-10-CM | POA: Diagnosis not present

## 2024-08-04 NOTE — Progress Notes (Unsigned)
   Kelsey Walton - 43 y.o. female MRN [id number]  Date of birth: 04/09/1981  Office Visit Note: Visit Date: 08/04/2024 PCP: Adina Buel HERO, MD Referred by: Adina Buel HERO, MD  Subjective:  HPI: Kelsey Walton is a 43 y.o. female who presents today for follow up 8 weeks status post right wrist distal radius fracture open reduction internal fixation.  She is making progress with range of motion of the wrist and hand, still having difficulty with supination.  Pertinent ROS were reviewed with the patient and found to be negative unless otherwise specified above in HPI.   Assessment & Plan: Visit Diagnoses:  1. S/P ORIF (open reduction internal fixation) fracture     Plan: X-rays continue to demonstrate appropriate healing of the wrist fracture.  I did discuss with her the possibility for manipulation under anesthesia in the future in order to help improve forearm rotation, we could also consider DRUJ stabilization at that time if needed.  She expressed understanding, return to me in about 4 weeks for recheck.  Continue with OT during that time.  Follow-up: No follow-ups on file.   Meds & Orders: No orders of the defined types were placed in this encounter.   Orders Placed This Encounter  Procedures   XR Wrist Complete Right     Procedures: No procedures performed       Objective:   Vital Signs: LMP 05/28/2019 (Within Days)   Ortho Exam Right wrist: - Well-healed volar incision - Digital range of motion remains limited - Digits are warm and well-perfused, sensation intact in all distributions median/radial/ulnar, AIN/PIN/interosseous intact - Pronation/supination is limited secondary to pain, 65/10 - Elbow range of motion 10/130  Imaging: XR Wrist Complete Right Result Date: 08/05/2024 Stable appearance of distal radius fracture with appropriate hardware fixation, no evidence of failure or migration.  Slight dorsal appearance of the distal ulna on the attempted lateral  view.    Marlei Glomski Afton Alderton, M.D. [facility], Hand Surgery

## 2024-08-05 ENCOUNTER — Ambulatory Visit: Admitting: Occupational Therapy

## 2024-08-05 DIAGNOSIS — R29818 Other symptoms and signs involving the nervous system: Secondary | ICD-10-CM

## 2024-08-05 DIAGNOSIS — M79641 Pain in right hand: Secondary | ICD-10-CM

## 2024-08-05 DIAGNOSIS — R29898 Other symptoms and signs involving the musculoskeletal system: Secondary | ICD-10-CM

## 2024-08-05 DIAGNOSIS — R278 Other lack of coordination: Secondary | ICD-10-CM

## 2024-08-05 DIAGNOSIS — R208 Other disturbances of skin sensation: Secondary | ICD-10-CM

## 2024-08-05 DIAGNOSIS — M6281 Muscle weakness (generalized): Secondary | ICD-10-CM

## 2024-08-05 DIAGNOSIS — M25641 Stiffness of right hand, not elsewhere classified: Secondary | ICD-10-CM | POA: Diagnosis not present

## 2024-08-05 NOTE — Therapy (Addendum)
 OUTPATIENT OCCUPATIONAL THERAPY ORTHO TREATMENT  Patient Name: Kelsey Walton MRN: [id number] DOB:Nov 27, 1980, 43 y.o., female Today's Date: 08/05/2024  PCP: Dr. Adina REFERRING PROVIDER: Dr. [name]  END OF SESSION:  OT End of Session - 08/05/24 1149     Visit Number 8    Number of Visits 16    Date for OT Re-Evaluation 08/20/24    Authorization Type [place] [employer]    OT Start Time 1104    OT Stop Time 1154    OT Time Calculation (min) 50 min    Activity Tolerance Patient limited by pain    Behavior During Therapy [facility] for tasks assessed/performed          Past Medical History:  Diagnosis Date   Anxiety    Arthritis    Depression    PTSD (post-traumatic stress disorder)    Seizures (HCC) 09/26/2019   teeth clencing/ anxiety are symtoms of seizures   Past Surgical History:  Procedure Laterality Date   ABDOMINAL HYSTERECTOMY N/A 06/30/2019   Procedure: HYSTERECTOMY ABDOMINAL;  Surgeon: Jayne Vonn DEL, MD;  Location: [facility];  Service: Gynecology;  Laterality: N/A;   AUGMENTATION MAMMAPLASTY Bilateral 2008   saline   BILATERAL SALPINGECTOMY Bilateral 06/30/2019   Procedure: OPEN BILATERAL SALPINGECTOMY;  Surgeon: Jayne Vonn DEL, MD;  Location: [facility];  Service: Gynecology;  Laterality: Bilateral;   DILATION AND CURETTAGE OF UTERUS     OPEN REDUCTION INTERNAL FIXATION (ORIF) DISTAL RADIAL FRACTURE Right 06/08/2024   Procedure: OPEN REDUCTION INTERNAL FIXATION (ORIF) DISTAL RADIUS FRACTURE;  Surgeon: [name] Buster, MD;  Location: [facility];  Service: Orthopedics;  Laterality: Right;  RIGHT WRIST OPEN REDUCTION INTERNAL FIXATION DISTAL RADIUS AND POSSIBLE ULNAR STYLOID   TUBAL LIGATION     Patient Active Problem List   Diagnosis Date Noted   Fracture, Colles, right, closed 06/08/2024   S/P hysterectomy 06/30/2019   Breast mass, right 07/17/2015    ONSET DATE: 06/08/24 - date of surgery  REFERRING DIAG:  Diagnosis  S52.501A,S52.601A (ICD-10-CM) - Closed  fracture of distal ends of right radius and ulna, initial encounter    THERAPY DIAG:  Muscle weakness (generalized)  Pain in right hand  Other lack of coordination  Other disturbances of skin sensation  Other symptoms and signs involving the nervous system  Other symptoms and signs involving the musculoskeletal system  Rationale for Evaluation and Treatment: Rehabilitation  SUBJECTIVE:   SUBJECTIVE STATEMENT: Pt reported she fell gracefully yesterday chasing goat in the woods. She is having soreness in RUE particularly the subscapularis. Pt also reported that Dr. lynnann have to perform more surgeries due to excessive scar tissue. Pt reported she will be out of work another 2 months due to not being able to use RUE.    Pt accompanied by: self  PERTINENT HISTORY: PMH: Anxiety, seizures, PTSD   OPERATIVE DIAGNOSIS: Right distal radius fracture with associated ulnar styloid fracture   PROCEDURE: Right distal radius fracture open reduction internal fixation  PRECAUTIONS: Other: IHP - pg 292 and 293; no lifting; TAPE ALLERGY  WEIGHT BEARING RESTRICTIONS: Yes NWB RUE  PAIN:  Are you having pain? Yes: NPRS scale: 8/10 (after stretches) chronic pain all the time (takes medications for relieve)  Pain location: RUE Pain description: numbness and tingling; soreness across base of hand Aggravating factors: stretching; movement   Relieving factors: medication  FALLS: Has patient fallen in last 6 months? Yes. Number of falls 2  LIVING ENVIRONMENT: Lives with: lives alone Lives in: House/apartment Stairs:  Yes: External: 7 steps; bilateral but cannot reach both Has following equipment at home: none  PLOF: Independent; driving  PATIENT GOALS: get back to working and hunting  NEXT MD VISIT: 08/04/2024   OBJECTIVE:  Note: Objective measures were completed at Evaluation unless otherwise noted.  HAND DOMINANCE: Right  ADLs: WFL  FUNCTIONAL OUTCOME MEASURES: Quick Dash: 93.2  % disability with use of RUE   UPPER EXTREMITY ROM:     TBD once medically appropriate  HAND FUNCTION: TBD once medically appropriate  COORDINATION: TBD once medically appropriate  SENSATION: Paresthesias including hypersensitivity to R hand  EDEMA: moderate  COGNITION: Overall cognitive status: Within functional limits for tasks assessed  OBSERVATIONS: Pt ambulates without use of AD. No loss of balance. The pt appears well kept and has R arm sling donned with small pillow between it and torso. Pt appears in severe pain with moderate swelling to R digits. Forearm cast in place. Good return demo of edema and ROM HEP.   TREATMENT:    - Self-care/home management completed for duration as noted below including:  OT reviewed recommendations of Dr. in regards to wrist fracture and maintenance of splint.   Manual therapy completed for duration as noted below including:  Manual techniques used to manipulate tight tissues and joints of R digits,wrist, and forearm. Soft tissue mobilization, deep tissue massage/pressure and stretching used to increase tissue extensibility and increase muscular relaxation with ultimate goal of improved ROM and decreased pain for contracture management.  Pt did not tolerate well  and appeared to be in pain with stretching of digits as evidence to facial expressions and verbalized pain. OT provided and PROM to RUE to relieve pain of scapula pain. Pt was also educated on how to perform these stretches at home.   PATIENT EDUCATION: Education details:  see above Person educated: Patient Education method: Explanation, Demonstration, Tactile cues, and Verbal cues Education comprehension: verbalized understanding, verbal cues required, tactile cues required, and needs further education  HOME EXERCISE PROGRAM: 06/23/2024: edema reduction; contrast baths, heat 07/07/2024: sleep positioning; desensitization 07/29/24: formal HEP for A/ROM and P/ROM  GOALS:  SHORT  TERM GOALS: Target date: 07/26/2024   I with inital HEP Baseline: Goal status: IN PROGRESS  2.  I with splint wear, care and precautions following 1-2 weeks of wear to ensure proper fit Baseline: casted Goal status: IN PROGRESS  3.  Pt will demonstrate composite finger flexion to at least 90% for increased functional use during ADLs. Baseline: casted Goal status: IN PROGRESS  4.  Pt will increase wrist flexion and extension by 15 from baseline for increased ease with ADLs. Baseline: casted Goal status: IN PROGRESS  LONG TERM GOALS: Target date: 08/20/2024    I with updated HEP Baseline:  Goal status: INITIAL  2.  Pt will demonstrate grip strength of at least 25 lbs for RUE for increased ease withIADLs. Baseline: casted Goal status: IN PROGRESS  3.  Pt will demonstrate improved RUE functional use as evidenced by improving Quick DASH score to 77.2 Baseline: 93.2% disability Goal status: IN PROGRESS  4.  Pt will demonstrate wrist flexion/ extension WFLs for ADLs/IADLs.  Baseline: casted Goal status: IN PROGRESS  5.  Pt will resume use of RUE as dominant hand at least 90% of the time with pain no greater than 3/10. Baseline: casted Goal status: IN PROGRESS  ASSESSMENT:  CLINICAL IMPRESSION: Patient presents with limited ROM in  wrist, digits, and forearm due to scar tissue secondary to distal radius fracture  with ORIF. She demonstrates fair rehab potential as evidence to little progress and possible future surgeries on the RUE to decrease amount of scar tissue. Pt will continue to benefit from skilled outpatient OT to increase wrist,digit, and forearm ROM for functional independence of ADLs and IADLs.  PERFORMANCE DEFICITS: in functional skills including ADLs, IADLs, coordination, sensation, edema, ROM, strength, pain, flexibility, Fine motor control, Gross motor control, mobility, balance, decreased knowledge of precautions, decreased knowledge of use of DME, and UE functional  use.   IMPAIRMENTS: are limiting patient from ADLs, IADLs, rest and sleep, work, leisure, and social participation.   COMORBIDITIES: may have co-morbidities  that affects occupational performance. Patient will benefit from skilled OT to address above impairments and improve overall function.  REHAB POTENTIAL: Fair given severity of sx  PLAN:  OT FREQUENCY: 1xweek until 8/13 and then 2 x week   OT DURATION: 8 weeks  PLANNED INTERVENTIONS: 97168 OT Re-evaluation, 97535 self care/ADL training, 02889 therapeutic exercise, 97530 therapeutic activity, 97112 neuromuscular re-education, 97140 manual therapy, 97035 ultrasound, 97018 paraffin, 02960 fluidotherapy, 97010 moist heat, 97010 cryotherapy, 97034 contrast bath, 97032 electrical stimulation (manual), 97760 Orthotic Initial, 97763 Orthotic/Prosthetic subsequent, scar mobilization, passive range of motion, functional mobility training, coping strategies training, patient/family education, and DME and/or AE instructions  RECOMMENDED OTHER SERVICES: N/A for this visit  CONSULTED AND AGREED WITH PLAN OF CARE: Patient  PLAN FOR NEXT SESSION: continue with aggressive A/ROM and P/ROM as tolerated. Show elbow extension ex against gravity.  (Begin putty ex's once cleared by MD)    Yuleni Burich, Student-OT 08/05/2024, 6:18 PM

## 2024-08-10 ENCOUNTER — Ambulatory Visit: Admitting: Occupational Therapy

## 2024-08-10 DIAGNOSIS — M25641 Stiffness of right hand, not elsewhere classified: Secondary | ICD-10-CM | POA: Diagnosis not present

## 2024-08-10 DIAGNOSIS — M6281 Muscle weakness (generalized): Secondary | ICD-10-CM

## 2024-08-10 DIAGNOSIS — R29898 Other symptoms and signs involving the musculoskeletal system: Secondary | ICD-10-CM

## 2024-08-10 DIAGNOSIS — R278 Other lack of coordination: Secondary | ICD-10-CM

## 2024-08-10 DIAGNOSIS — M79641 Pain in right hand: Secondary | ICD-10-CM

## 2024-08-10 NOTE — Therapy (Signed)
 OUTPATIENT OCCUPATIONAL THERAPY ORTHO TREATMENT  Patient Name: Kelsey Walton MRN: [id number] DOB:05-03-1981, 43 y.o., female Today's Date: 08/10/2024  PCP: Dr. Adina REFERRING PROVIDER: Dr. [name]  END OF SESSION:  OT End of Session - 08/10/24 1237     Visit Number 9    Number of Visits 16    Date for OT Re-Evaluation 08/20/24    Authorization Type [place] [employer]    OT Start Time 1237    OT Stop Time 1317    OT Time Calculation (min) 40 min    Equipment Utilized During Treatment fluidotherapy    Activity Tolerance Other (comment)   Pt limited by stiffness and weakness   Behavior During Therapy --   Frustrated         Past Medical History:  Diagnosis Date   Anxiety    Arthritis    Depression    PTSD (post-traumatic stress disorder)    Seizures (HCC) 09/26/2019   teeth clencing/ anxiety are symtoms of seizures   Past Surgical History:  Procedure Laterality Date   ABDOMINAL HYSTERECTOMY N/A 06/30/2019   Procedure: HYSTERECTOMY ABDOMINAL;  Surgeon: Jayne Vonn DEL, MD;  Location: [facility];  Service: Gynecology;  Laterality: N/A;   AUGMENTATION MAMMAPLASTY Bilateral 2008   saline   BILATERAL SALPINGECTOMY Bilateral 06/30/2019   Procedure: OPEN BILATERAL SALPINGECTOMY;  Surgeon: Jayne Vonn DEL, MD;  Location: [facility];  Service: Gynecology;  Laterality: Bilateral;   DILATION AND CURETTAGE OF UTERUS     OPEN REDUCTION INTERNAL FIXATION (ORIF) DISTAL RADIAL FRACTURE Right 06/08/2024   Procedure: OPEN REDUCTION INTERNAL FIXATION (ORIF) DISTAL RADIUS FRACTURE;  Surgeon: [name] Buster, MD;  Location: [facility];  Service: Orthopedics;  Laterality: Right;  RIGHT WRIST OPEN REDUCTION INTERNAL FIXATION DISTAL RADIUS AND POSSIBLE ULNAR STYLOID   TUBAL LIGATION     Patient Active Problem List   Diagnosis Date Noted   Fracture, Colles, right, closed 06/08/2024   S/P hysterectomy 06/30/2019   Breast mass, right 07/17/2015    ONSET DATE: 06/08/24 - date of  surgery  REFERRING DIAG:  Diagnosis  S52.501A,S52.601A (ICD-10-CM) - Closed fracture of distal ends of right radius and ulna, initial encounter    THERAPY DIAG:  Stiffness of right hand, not elsewhere classified  Muscle weakness (generalized)  Other lack of coordination  Pain in right hand  Other symptoms and signs involving the musculoskeletal system  Rationale for Evaluation and Treatment: Rehabilitation  SUBJECTIVE:   SUBJECTIVE STATEMENT:  Pt report ongoing concerns re: possibility of Dr. having to do more surgeries due to R wrist and forearm limitations. She reports limitations with thumb movements, being able to make a fist and turn her forearm (palm up).  She noted some swelling when wearing a prefab splint from MD for hunting this weekend and is frustrated with the limitations her injury has had on her hunting and return to work.   Pt accompanied by: self  PERTINENT HISTORY: PMH: Anxiety, seizures, PTSD   OPERATIVE DIAGNOSIS: Right distal radius fracture with associated ulnar styloid fracture   PROCEDURE: Right distal radius fracture open reduction internal fixation  PRECAUTIONS: Other: IHP - pg 292 and 293; no lifting; TAPE ALLERGY  WEIGHT BEARING RESTRICTIONS: Yes NWB RUE  PAIN:  Are you having pain? Yes: NPRS scale: 8/10 (after stretches) chronic pain all the time (takes medications for relieve)  Pain location: RUE Pain description: numbness and tingling; soreness across base of hand Aggravating factors: stretching; movement   Relieving factors: medication  FALLS: Has patient  fallen in last 6 months? Yes. Number of falls 2  LIVING ENVIRONMENT: Lives with: lives alone Lives in: House/apartment Stairs: Yes: External: 7 steps; bilateral but cannot reach both Has following equipment at home: none  PLOF: Independent; driving  PATIENT GOALS: get back to working and hunting  NEXT MD VISIT: 08/04/2024   OBJECTIVE:  Note: Objective measures were completed  at Evaluation unless otherwise noted.  HAND DOMINANCE: Right  ADLs: WFL  FUNCTIONAL OUTCOME MEASURES: Quick Dash: 93.2 % disability with use of RUE   UPPER EXTREMITY ROM:     TBD once medically appropriate  HAND FUNCTION: TBD once medically appropriate  COORDINATION: TBD once medically appropriate  SENSATION: Paresthesias including hypersensitivity to R hand  EDEMA: moderate  COGNITION: Overall cognitive status: Within functional limits for tasks assessed  OBSERVATIONS: Pt ambulates without use of AD. No loss of balance. The pt appears well kept and has R arm sling donned with small pillow between it and torso. Pt appears in severe pain with moderate swelling to R digits. Forearm cast in place. Good return demo of edema and ROM HEP.   TODAY'S TREATMENT:     - Therapeutic exercises completed for duration as noted below including:  Pt placed BUE in Fluidotherapy machine with supervised ROM x 10 min. Pt was educated to complete tendon glides and other AROM including wrist flexion/extension and finger opposition during modality time as well as using L hand to help stretch her fingers down in order to improve ROM and decrease pain/stiffness of affected extremity by use of the machine's massaging action and thermal properties.   Pt able to perform finger opposition to thumb for index/middle finger with assistance needed for medial 2 digits.  She is guided to work on isolated thumb IP flexion along with wrist flexion/extension - with pt reporting she only recently has been able to get her hand/wrist flat.    Manual therapy completed for duration as noted below including:  Manual techniques used after fluidotherapy, to manipulate tight tissues and joints of R digits,wrist, and forearm. Soft tissue mobilization, deep tissue massage/pressure and stretching used to increase tissue extensibility and increase muscular relaxation with ultimate goal of improved ROM and decreased pain for  contracture management.  Pt tolerated ROM of wrist to to neutral fair but reports difficulty with forearm supination.  She had taken a muscle relaxer before coming today but still notes stretches to result in pain.  Orthotic Assessment: Pt continues to have clamshell wrist cock up splint in place and OTR to reach out to MD re: progressing splint as she could benefit form further wrist extension, modifications around her thumb to improve motion as well as work towards decreased splint use with non-risky activities.   PATIENT EDUCATION: Education details:  see above Person educated: Patient Education method: Explanation, Demonstration, Tactile cues, and Verbal cues Education comprehension: verbalized understanding, verbal cues required, tactile cues required, and needs further education  HOME EXERCISE PROGRAM: 06/23/2024: edema reduction; contrast baths, heat 07/07/2024: sleep positioning; desensitization 07/29/24: formal HEP for A/ROM and P/ROM  GOALS:  SHORT TERM GOALS: Target date: 07/26/2024   I with inital HEP Baseline: Goal status: MET  2.  I with splint wear, care and precautions following 1-2 weeks of wear to ensure proper fit Baseline: casted Goal status: MET  3.  Pt will demonstrate composite finger flexion to at least 90% for increased functional use during ADLs. Baseline: casted Goal status: IN PROGRESS  4.  Pt will increase wrist flexion and extension  by 15 from baseline for increased ease with ADLs. Baseline: casted Goal status: IN PROGRESS  LONG TERM GOALS: Target date: 08/20/2024    I with updated HEP Baseline:  Goal status: INITIAL  2.  Pt will demonstrate grip strength of at least 25 lbs for RUE for increased ease withIADLs. Baseline: casted Goal status: IN PROGRESS  3.  Pt will demonstrate improved RUE functional use as evidenced by improving Quick DASH score to 77.2 Baseline: 93.2% disability Goal status: IN PROGRESS  4.  Pt will demonstrate wrist  flexion/ extension WFLs for ADLs/IADLs.  Baseline: casted Goal status: IN PROGRESS  5.  Pt will resume use of RUE as dominant hand at least 90% of the time with pain no greater than 3/10. Baseline: casted Goal status: IN PROGRESS  ASSESSMENT:  CLINICAL IMPRESSION: Patient presents with limited ROM in dominant RUE wrist, digits, and forearm due to scar tissue secondary to distal radius fracture with ORIF. She demonstrates neutral wrist position but is potentially in need of future surgeries on the RUE to decrease amount of scar tissue. Pt will continue to benefit from skilled outpatient OT to increase wrist,digit, and forearm ROM for functional independence of ADLs and IADLs.  PERFORMANCE DEFICITS: in functional skills including ADLs, IADLs, coordination, sensation, edema, ROM, strength, pain, flexibility, Fine motor control, Gross motor control, mobility, balance, decreased knowledge of precautions, decreased knowledge of use of DME, and UE functional use.   IMPAIRMENTS: are limiting patient from ADLs, IADLs, rest and sleep, work, leisure, and social participation.   COMORBIDITIES: may have co-morbidities  that affects occupational performance. Patient will benefit from skilled OT to address above impairments and improve overall function.  REHAB POTENTIAL: Fair given severity of sx  PLAN:  OT FREQUENCY: 1xweek until 8/13 and then 2 x week   OT DURATION: 8 weeks  PLANNED INTERVENTIONS: 97168 OT Re-evaluation, 97535 self care/ADL training, 02889 therapeutic exercise, 97530 therapeutic activity, 97112 neuromuscular re-education, 97140 manual therapy, 97035 ultrasound, 97018 paraffin, 02960 fluidotherapy, 97010 moist heat, 97010 cryotherapy, 97034 contrast bath, 97032 electrical stimulation (manual), 97760 Orthotic Initial, 97763 Orthotic/Prosthetic subsequent, scar mobilization, passive range of motion, functional mobility training, coping strategies training, patient/family education, and  DME and/or AE instructions  RECOMMENDED OTHER SERVICES: N/A for this visit  CONSULTED AND AGREED WITH PLAN OF CARE: Patient  PLAN FOR NEXT SESSION: continue with aggressive A/ROM and P/ROM as tolerated. Show elbow extension ex against gravity.  (Begin putty ex's once cleared by MD)    Kelsey Walton, OT 08/10/2024, 1:33 PM

## 2024-08-11 ENCOUNTER — Encounter: Payer: Self-pay | Admitting: Occupational Therapy

## 2024-08-11 NOTE — Therapy (Signed)
 [facility] [address] [place], [place], [address] Phone: [phone]   Fax:  [phone]  Patient Details  Name: Kelsey Walton MRN: [id number] Date of Birth: May 28, 1981 Referring Provider:  No ref. provider found  Encounter Date: 08/11/2024  OCCUPATIONAL THERAPY DISCHARGE SUMMARY  Visits from Start of Care: 9 including evaluation  Current functional level related to goals / functional outcomes: Pt has met 2/4 short term goals and was working on ROM, strength and coordination for 5/5 long term goals.   Remaining deficits: Pt has ongoing significant functional deficits with RUE ROM, strength, coordination and pain with scheduled surgery in the next 2 weeks to help with ROM limitations of RUE.   Education / Equipment: Pt has initial Audiological scientist and education. Pt understands how to continue on with self-management. See tx notes for more details.   Patient agrees to discharge due to max benefit received from outpatient occupational therapy / hand therapy at this time prior to subsequent surgery when therapy will need to be resumed.     Clarita LITTIE Pride, OT 08/11/2024, 3:21 PM

## 2024-08-12 ENCOUNTER — Ambulatory Visit: Admitting: Occupational Therapy

## 2024-08-16 ENCOUNTER — Other Ambulatory Visit: Payer: Self-pay

## 2024-08-16 DIAGNOSIS — Z9889 Other specified postprocedural states: Secondary | ICD-10-CM

## 2024-08-17 ENCOUNTER — Encounter: Admitting: Occupational Therapy

## 2024-08-19 ENCOUNTER — Encounter: Admitting: Occupational Therapy

## 2024-08-20 ENCOUNTER — Encounter (HOSPITAL_BASED_OUTPATIENT_CLINIC_OR_DEPARTMENT_OTHER): Payer: Self-pay | Admitting: Orthopedic Surgery

## 2024-08-23 ENCOUNTER — Ambulatory Visit: Admitting: Orthopedic Surgery

## 2024-08-23 ENCOUNTER — Other Ambulatory Visit: Payer: Self-pay | Admitting: Orthopedic Surgery

## 2024-08-23 DIAGNOSIS — M25331 Other instability, right wrist: Secondary | ICD-10-CM

## 2024-08-23 MED ORDER — CYCLOBENZAPRINE HCL 10 MG PO TABS: 10.0000 mg | ORAL_TABLET | Freq: Three times a day (TID) | ORAL | 0 refills | Status: DC | PRN

## 2024-08-23 NOTE — H&P (View-Only) (Signed)
   Kelsey Walton - 43 y.o. female MRN [id number]  Date of birth: [birth year]  Office Visit Note: Visit Date: 08/23/2024 PCP: Adina Buel HERO, MD Referred by: Adina Buel HERO, MD  Subjective:  HPI: Kelsey Walton is a 43 y.o. female who presents today for follow up approximately 2 months status post right wrist distal radius fracture open reduction internal fixation.  She is making progress with range of motion of the wrist and hand, still having difficulty with supination.  Pertinent ROS were reviewed with the patient and found to be negative unless otherwise specified above in HPI.   Assessment & Plan: Visit Diagnoses:  1. 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  Given her ongoing issues with limitation of supination, she may have a ongoing issue at the distal radioulnar joint which is precluding her ability for rotation.  I did discuss with her that we can proceed with manipulation under anesthesia, possible exploration of the DRUJ, possible DRUJ stabilization in order to help correct rotation at the wrist level.  Surgery will be performed later this week.  Risks and benefits of the procedure were discussed, risks including but not limited to infection, bleeding, scarring, stiffness, nerve injury, tendon injury, vascular injury, hardware complication, recurrence of symptoms and need for subsequent operation.  We also discussed the appropriate postoperative protocol and timeframe for return to activities and function.  Patient expressed understanding.     Follow-up: No follow-ups on file.   Meds & Orders: No orders of the defined types were placed in this encounter.   No orders of the defined types were placed in this encounter.    Procedures: No procedures performed       Objective:   Vital Signs: LMP 05/28/2019 (Within Days)   Ortho Exam Right wrist: - Well-healed  volar incision - Digital range of motion remains limited - Digits are warm and well-perfused, sensation intact in all distributions median/radial/ulnar, AIN/PIN/interosseous intact - Pronation/supination is limited secondary to pain, 65/10 - Elbow range of motion 10/130  Imaging: No results found.    Alicia Seib Afton Alderton, M.D. [facility], Hand Surgery

## 2024-08-23 NOTE — Progress Notes (Signed)
   Kelsey Walton - 43 y.o. female MRN [id number]  Date of birth: [birth year]  Office Visit Note: Visit Date: 08/23/2024 PCP: Adina Buel HERO, MD Referred by: Adina Buel HERO, MD  Subjective:  HPI: Kelsey Walton is a 43 y.o. female who presents today for follow up approximately 2 months status post right wrist distal radius fracture open reduction internal fixation.  She is making progress with range of motion of the wrist and hand, still having difficulty with supination.  Pertinent ROS were reviewed with the patient and found to be negative unless otherwise specified above in HPI.   Assessment & Plan: Visit Diagnoses:  1. 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  Given her ongoing issues with limitation of supination, she may have a ongoing issue at the distal radioulnar joint which is precluding her ability for rotation.  I did discuss with her that we can proceed with manipulation under anesthesia, possible exploration of the DRUJ, possible DRUJ stabilization in order to help correct rotation at the wrist level.  Surgery will be performed later this week.  Risks and benefits of the procedure were discussed, risks including but not limited to infection, bleeding, scarring, stiffness, nerve injury, tendon injury, vascular injury, hardware complication, recurrence of symptoms and need for subsequent operation.  We also discussed the appropriate postoperative protocol and timeframe for return to activities and function.  Patient expressed understanding.     Follow-up: No follow-ups on file.   Meds & Orders: No orders of the defined types were placed in this encounter.   No orders of the defined types were placed in this encounter.    Procedures: No procedures performed       Objective:   Vital Signs: LMP 05/28/2019 (Within Days)   Ortho Exam Right wrist: - Well-healed  volar incision - Digital range of motion remains limited - Digits are warm and well-perfused, sensation intact in all distributions median/radial/ulnar, AIN/PIN/interosseous intact - Pronation/supination is limited secondary to pain, 65/10 - Elbow range of motion 10/130  Imaging: No results found.    Alicia Seib Afton Alderton, M.D. [facility], Hand Surgery

## 2024-08-26 ENCOUNTER — Other Ambulatory Visit: Payer: Self-pay | Admitting: Orthopedic Surgery

## 2024-08-26 MED ORDER — OXYCODONE HCL 5 MG PO TABS: 5.0000 mg | ORAL_TABLET | Freq: Four times a day (QID) | ORAL | 0 refills | Status: DC | PRN

## 2024-08-26 NOTE — Discharge Instructions (Signed)

## 2024-08-27 ENCOUNTER — Ambulatory Visit (HOSPITAL_BASED_OUTPATIENT_CLINIC_OR_DEPARTMENT_OTHER)

## 2024-08-27 ENCOUNTER — Encounter (HOSPITAL_BASED_OUTPATIENT_CLINIC_OR_DEPARTMENT_OTHER): Admission: RE | Disposition: A | Payer: Self-pay | Source: Home / Self Care | Attending: Orthopedic Surgery

## 2024-08-27 ENCOUNTER — Other Ambulatory Visit: Payer: Self-pay

## 2024-08-27 ENCOUNTER — Encounter (HOSPITAL_BASED_OUTPATIENT_CLINIC_OR_DEPARTMENT_OTHER): Payer: Self-pay | Admitting: Orthopedic Surgery

## 2024-08-27 ENCOUNTER — Ambulatory Visit (HOSPITAL_BASED_OUTPATIENT_CLINIC_OR_DEPARTMENT_OTHER): Admitting: Certified Registered"

## 2024-08-27 ENCOUNTER — Ambulatory Visit (HOSPITAL_BASED_OUTPATIENT_CLINIC_OR_DEPARTMENT_OTHER)
Admission: RE | Admit: 2024-08-27 | Discharge: 2024-08-27 | Disposition: A | Discharge status: Home / Self Care | Attending: Orthopedic Surgery | Admitting: Orthopedic Surgery

## 2024-08-27 DIAGNOSIS — F418 Other specified anxiety disorders: Secondary | ICD-10-CM

## 2024-08-27 DIAGNOSIS — J449 Chronic obstructive pulmonary disease, unspecified: Secondary | ICD-10-CM | POA: Diagnosis not present

## 2024-08-27 DIAGNOSIS — M25631 Stiffness of right wrist, not elsewhere classified: Secondary | ICD-10-CM

## 2024-08-27 DIAGNOSIS — Z7951 Long term (current) use of inhaled steroids: Secondary | ICD-10-CM | POA: Diagnosis not present

## 2024-08-27 DIAGNOSIS — Z87891 Personal history of nicotine dependence: Secondary | ICD-10-CM | POA: Diagnosis not present

## 2024-08-27 DIAGNOSIS — F419 Anxiety disorder, unspecified: Secondary | ICD-10-CM | POA: Diagnosis not present

## 2024-08-27 DIAGNOSIS — F32A Depression, unspecified: Secondary | ICD-10-CM | POA: Insufficient documentation

## 2024-08-27 DIAGNOSIS — M25331 Other instability, right wrist: Secondary | ICD-10-CM | POA: Diagnosis present

## 2024-08-27 DIAGNOSIS — F4310 Post-traumatic stress disorder, unspecified: Secondary | ICD-10-CM | POA: Diagnosis not present

## 2024-08-27 SURGERY — ARTHROTOMY, DISTAL RADIOULNAR JOINT, WITH TRIANGULAR FIBROCARTILAGE COMPLEX REPAIR AND DISTAL RADIOULNAR JOINT STABILIZATIOIN
Anesthesia: Monitor Anesthesia Care | Site: Wrist | Laterality: Right

## 2024-08-27 MED ORDER — FENTANYL CITRATE (PF) 100 MCG/2ML IJ SOLN
INTRAMUSCULAR | Status: AC
  Filled 2024-08-27: qty 2

## 2024-08-27 MED ORDER — MIDAZOLAM HCL 5 MG/5ML IJ SOLN
INTRAMUSCULAR | Status: DC | PRN
  Administered 2024-08-27: 2 mg via INTRAVENOUS

## 2024-08-27 MED ORDER — FENTANYL CITRATE (PF) 100 MCG/2ML IJ SOLN
INTRAMUSCULAR | Status: DC | PRN
  Administered 2024-08-27 (×2): 50 ug via INTRAVENOUS

## 2024-08-27 MED ORDER — LIDOCAINE 2% (20 MG/ML) 5 ML SYRINGE
INTRAMUSCULAR | Status: AC
  Filled 2024-08-27: qty 5

## 2024-08-27 MED ORDER — ONDANSETRON HCL 4 MG/2ML IJ SOLN
INTRAMUSCULAR | Status: AC
  Filled 2024-08-27: qty 2

## 2024-08-27 MED ORDER — MIDAZOLAM HCL 2 MG/2ML IJ SOLN
INTRAMUSCULAR | Status: AC
  Filled 2024-08-27: qty 2

## 2024-08-27 MED ORDER — SCOPOLAMINE 1 MG/3DAYS TD PT72
1.0000 | MEDICATED_PATCH | TRANSDERMAL | Status: DC
Start: 2024-08-27 — End: 2024-08-27
  Administered 2024-08-27: 1 mg via TRANSDERMAL

## 2024-08-27 MED ORDER — 0.9 % SODIUM CHLORIDE (POUR BTL) OPTIME
TOPICAL | Status: DC | PRN
Start: 2024-08-27 — End: 2024-08-27
  Administered 2024-08-27: 1000 mL

## 2024-08-27 MED ORDER — MIDAZOLAM HCL 2 MG/2ML IJ SOLN
2.0000 mg | Freq: Once | INTRAMUSCULAR | Status: AC
  Administered 2024-08-27: 2 mg via INTRAVENOUS

## 2024-08-27 MED ORDER — CEFAZOLIN SODIUM-DEXTROSE 2-4 GM/100ML-% IV SOLN
INTRAVENOUS | Status: AC
  Filled 2024-08-27: qty 100

## 2024-08-27 MED ORDER — ONDANSETRON HCL 4 MG/2ML IJ SOLN
INTRAMUSCULAR | Status: DC | PRN
  Administered 2024-08-27: 4 mg via INTRAVENOUS

## 2024-08-27 MED ORDER — BUPIVACAINE-EPINEPHRINE (PF) 0.5% -1:200000 IJ SOLN
INTRAMUSCULAR | Status: DC | PRN
  Administered 2024-08-27: 15 mL via PERINEURAL

## 2024-08-27 MED ORDER — DEXMEDETOMIDINE HCL IN NACL 200 MCG/50ML IV SOLN
INTRAVENOUS | Status: DC | PRN
Start: 2024-08-27 — End: 2024-08-27
  Administered 2024-08-27: 8 ug via INTRAVENOUS

## 2024-08-27 MED ORDER — MEPERIDINE HCL 25 MG/ML IJ SOLN: 6.2500 mg | INTRAMUSCULAR | Status: DC | PRN

## 2024-08-27 MED ORDER — CEFAZOLIN SODIUM-DEXTROSE 2-4 GM/100ML-% IV SOLN
2.0000 g | INTRAVENOUS | Status: AC
  Administered 2024-08-27: 2 g via INTRAVENOUS

## 2024-08-27 MED ORDER — LACTATED RINGERS IV SOLN: INTRAVENOUS | Status: DC | PRN

## 2024-08-27 MED ORDER — OXYCODONE HCL 5 MG/5ML PO SOLN: 5.0000 mg | Freq: Once | ORAL | Status: DC | PRN

## 2024-08-27 MED ORDER — OXYCODONE HCL 5 MG PO TABS: 5.0000 mg | ORAL_TABLET | Freq: Once | ORAL | Status: DC | PRN

## 2024-08-27 MED ORDER — LACTATED RINGERS IV SOLN
INTRAVENOUS | Status: DC
Start: 2024-08-27 — End: 2024-08-27

## 2024-08-27 MED ORDER — DEXAMETHASONE SODIUM PHOSPHATE 10 MG/ML IJ SOLN
INTRAMUSCULAR | Status: AC
  Filled 2024-08-27: qty 1

## 2024-08-27 MED ORDER — PROPOFOL 500 MG/50ML IV EMUL
INTRAVENOUS | Status: DC | PRN
  Administered 2024-08-27: 200 ug/kg/min via INTRAVENOUS

## 2024-08-27 MED ORDER — SCOPOLAMINE 1 MG/3DAYS TD PT72
MEDICATED_PATCH | TRANSDERMAL | Status: AC
  Filled 2024-08-27: qty 1

## 2024-08-27 MED ORDER — MIDAZOLAM HCL 2 MG/2ML IJ SOLN: 0.5000 mg | Freq: Once | INTRAMUSCULAR | Status: DC | PRN

## 2024-08-27 MED ORDER — ACETAMINOPHEN 500 MG PO TABS
ORAL_TABLET | ORAL | Status: AC
Start: 2024-08-27 — End: 2024-08-27
  Filled 2024-08-27: qty 2

## 2024-08-27 MED ORDER — BUPIVACAINE LIPOSOME 1.3 % IJ SUSP
INTRAMUSCULAR | Status: DC | PRN
  Administered 2024-08-27: 10 mL via PERINEURAL

## 2024-08-27 MED ORDER — PROPOFOL 10 MG/ML IV BOLUS
INTRAVENOUS | Status: AC
  Filled 2024-08-27: qty 20

## 2024-08-27 MED ORDER — ACETAMINOPHEN 500 MG PO TABS
1000.0000 mg | ORAL_TABLET | Freq: Once | ORAL | Status: AC
  Administered 2024-08-27: 1000 mg via ORAL

## 2024-08-27 MED ORDER — HYDROMORPHONE HCL 1 MG/ML IJ SOLN: 0.2500 mg | INTRAMUSCULAR | Status: DC | PRN

## 2024-08-27 MED ORDER — FENTANYL CITRATE (PF) 100 MCG/2ML IJ SOLN
100.0000 ug | Freq: Once | INTRAMUSCULAR | Status: AC
  Administered 2024-08-27: 100 ug via INTRAVENOUS

## 2024-08-27 SURGICAL SUPPLY — 62 items
BLADE MINI RND TIP GREEN BEAV (BLADE) IMPLANT
BLADE SURG 11 STRL SS (BLADE) ×1 IMPLANT
BLADE SURG 15 STRL LF DISP TIS (BLADE) IMPLANT
BNDG COMPR ESMARK 4X3 LF (GAUZE/BANDAGES/DRESSINGS) ×1 IMPLANT
BNDG ELASTIC 4INX 5YD STR LF (GAUZE/BANDAGES/DRESSINGS) ×2 IMPLANT
BNDG GAUZE DERMACEA FLUFF 4 (GAUZE/BANDAGES/DRESSINGS) ×1 IMPLANT
CHLORAPREP W/TINT 26 (MISCELLANEOUS) ×1 IMPLANT
CORD BIPOLAR FORCEPS 12FT (ELECTRODE) IMPLANT
COVER BACK TABLE 60X90IN (DRAPES) ×1 IMPLANT
CUFF TOURN SGL QUICK 18X4 (TOURNIQUET CUFF) ×1 IMPLANT
DRAPE HAND 77X146 (DRAPES) ×1 IMPLANT
DRAPE OEC MINIVIEW 54X84 (DRAPES) IMPLANT
DRAPE SURG 17X23 STRL (DRAPES) ×1 IMPLANT
GAUZE PAD ABD 8X10 STRL (GAUZE/BANDAGES/DRESSINGS) ×1 IMPLANT
GAUZE SPONGE 4X4 12PLY STRL (GAUZE/BANDAGES/DRESSINGS) ×1 IMPLANT
GAUZE STRETCH 2X75IN STRL (MISCELLANEOUS) ×1 IMPLANT
GAUZE XEROFORM 1X8 LF (GAUZE/BANDAGES/DRESSINGS) ×1 IMPLANT
GLOVE BIO SURGEON STRL SZ7.5 (GLOVE) ×2 IMPLANT
GLOVE BIOGEL PI IND STRL 7.5 (GLOVE) ×1 IMPLANT
GOWN STRL REUS W/ TWL LRG LVL3 (GOWN DISPOSABLE) ×1 IMPLANT
GOWN STRL REUS W/TWL XL LVL3 (GOWN DISPOSABLE) IMPLANT
GOWN STRL SURGICAL XL XLNG (GOWN DISPOSABLE) ×1 IMPLANT
IV SET EXT 30 76VOL 4 MALE LL (IV SETS) IMPLANT
KWIRE DBL .062X4 NSTRL (WIRE) IMPLANT
MANIFOLD NEPTUNE II (INSTRUMENTS) ×1 IMPLANT
NDL HYPO 25X1 1.5 SAFETY (NEEDLE) ×1 IMPLANT
NDL HYPO 25X5/8 SAFETYGLIDE (NEEDLE) IMPLANT
NDL SAFETY ECLIPSE 18X1.5 (NEEDLE) ×1 IMPLANT
NDL SPNL 18GX3.5 QUINCKE PK (NEEDLE) IMPLANT
NEEDLE HYPO 25X1 1.5 SAFETY (NEEDLE) IMPLANT
NEEDLE HYPO 25X5/8 SAFETYGLIDE (NEEDLE) IMPLANT
NEEDLE SPNL 18GX3.5 QUINCKE PK (NEEDLE) IMPLANT
NS IRRIG 1000ML POUR BTL (IV SOLUTION) IMPLANT
PACK BASIN DAY SURGERY FS (CUSTOM PROCEDURE TRAY) ×1 IMPLANT
PAD CAST 3X4 CTTN HI CHSV (CAST SUPPLIES) IMPLANT
PIN GUARD 1.57MM GREEN STERILE (PIN) IMPLANT
SHAVER DISSECTOR 3.0 (BURR) ×1 IMPLANT
SHAVER SABRE 2.0 (BURR) IMPLANT
SHEET MEDIUM DRAPE 40X70 STRL (DRAPES) ×1 IMPLANT
SLEEVE SCD COMPRESS KNEE MED (STOCKING) ×1 IMPLANT
SLING ARM FOAM STRAP LRG (SOFTGOODS) IMPLANT
SPIKE FLUID TRANSFER (MISCELLANEOUS) IMPLANT
SPLINT PLASTER CAST XFAST 4X15 (CAST SUPPLIES) ×1 IMPLANT
SPONGE SURGIFOAM ABS GEL 12-7 (HEMOSTASIS) IMPLANT
STOCKINETTE IMPERVIOUS 9X36 MD (GAUZE/BANDAGES/DRESSINGS) ×1 IMPLANT
STRAP SET WRIST TOWER ACUMED (MISCELLANEOUS) ×1 IMPLANT
SUCTION TUBE FRAZIER 10FR DISP (SUCTIONS) IMPLANT
SUT CHROMIC 3 0 SH 27 (SUTURE) IMPLANT
SUT ETHILON 4 0 PS 2 18 (SUTURE) ×1 IMPLANT
SUT MNCRL AB 3-0 PS2 27 (SUTURE) IMPLANT
SUT MNCRL AB 4-0 PS2 18 (SUTURE) IMPLANT
SUT PDS AB 4-0 RB1 27 (SUTURE) IMPLANT
SUT SILK 4 0 PS 2 (SUTURE) IMPLANT
SUT VIC AB 3-0 PS2 18XBRD (SUTURE) IMPLANT
SUT VIC AB 4-0 PS2 18 (SUTURE) IMPLANT
SYR BULB EAR ULCER 3OZ GRN STR (SYRINGE) IMPLANT
SYR CONTROL 10ML LL (SYRINGE) ×1 IMPLANT
TOWEL GREEN STERILE FF (TOWEL DISPOSABLE) ×2 IMPLANT
TUBE CONNECTING 20X1/4 (TUBING) IMPLANT
TUBING ARTHROSCOPY IRRIG 16FT (MISCELLANEOUS) ×1 IMPLANT
UNDERPAD 30X36 HEAVY ABSORB (UNDERPADS AND DIAPERS) ×1 IMPLANT
WAND 1.5 MICROBLATOR (SURGICAL WAND) IMPLANT

## 2024-08-27 NOTE — Transfer of Care (Signed)
 Immediate Anesthesia Transfer of Care Note  Patient: Kelsey Walton  Procedure(s) Performed: RIGHT WRIST MANIPULATION UNDER ANESTHESIA, DISTAL RADIOULNAR JOINT STABILIZATIOIN WITH PINNING (Right: Wrist)  Patient Location: PACU  Anesthesia Type:MAC and Regional  Level of Consciousness: awake, alert , and oriented  Airway & Oxygen Therapy: Patient Spontanous Breathing and Patient connected to face mask oxygen  Post-op Assessment: Report given to RN and Post -op Vital signs reviewed and stable  Post vital signs: Reviewed and stable  Last Vitals:  Vitals Value Taken Time  BP 120/61 08/27/24 12:47  Temp    Pulse 79 08/27/24 12:50  Resp 12 08/27/24 12:50  SpO2 97 % 08/27/24 12:50  Vitals shown include unfiled device data.  Last Pain: There were no vitals filed for this visit.    Patients Stated Pain Goal: 3 (08/27/24 1057)  Complications: No notable events documented.

## 2024-08-27 NOTE — Interval H&P Note (Signed)
 History and Physical Interval Note:  08/27/2024 11:12 AM  Kelsey Walton  has presented today for surgery, with the diagnosis of RIGHT WRIST DISTAL RADIAL ULNAR JOINT INSTABILITY.  The various methods of treatment have been discussed with the patient and family. After consideration of risks, benefits and other options for treatment, the patient has consented to  Procedure(s) with comments: ARTHROTOMY, DISTAL RADIOULNAR JOINT, WITH TRIANGULAR FIBROCARTILAGE COMPLEX REPAIR AND DISTAL RADIOULNAR JOINT STABILIZATIOIN (Right) - RIGHT WRIST OPEN TREATMENT ARTHROTOMY WITH STABILIZATION / POSSIBLE TRIANGULAR FIBROCARILAGE COMPLEX REPAIR as a surgical intervention.  The patient's history has been reviewed, patient examined, no change in status, stable for surgery.  I have reviewed the patient's chart and labs.  Questions were answered to the patient's satisfaction.     Elpidia Karn

## 2024-08-27 NOTE — Op Note (Signed)
 NAME: Kelsey Walton MEDICAL RECORD NO: [id number] DATE OF BIRTH: 09/23/1981 FACILITY: [facility] LOCATION: [facility] PHYSICIAN: GILDARDO ALDERTON, MD   OPERATIVE REPORT   DATE OF PROCEDURE: 08/27/24    PREOPERATIVE DIAGNOSIS: Right wrist stiffness, possible distal radial ulnar joint instability status post prior distal radius ORIF   POSTOPERATIVE DIAGNOSIS: Right wrist stiffness, distal radioulnar joint instability   PROCEDURE: Right wrist manipulation under anesthesia Distal radioulnar joint stabilization with pinning   SURGEON:  GILDARDO ALDERTON, M.D.   ASSISTANT: Almeda Rummer, PA   ANESTHESIA:  Regional with sedation   INTRAVENOUS FLUIDS:  Per anesthesia flow sheet.   ESTIMATED BLOOD LOSS:  Minimal.   COMPLICATIONS:  None.   SPECIMENS:  none   TOURNIQUET TIME:    Total Tourniquet Time Documented: Upper Arm (Right) - 9 minutes Upper Arm (Right) - 2 minutes Total: Upper Arm (Right) - 11 minutes    DISPOSITION:  Stable to PACU.   INDICATIONS: 43 year old female who had undergone prior right distal radius open reduction internal fixation.  There was concern for ongoing stiffness at the wrist joint particular with supination.  There is also concern for potential distal radioulnar joint instability which may have been posttraumatic in nature.  Patient is indicated for right wrist manipulation under anesthesia with possible distal radial ulnar joint stabilization.  Risks and benefits of surgery were discussed including the risks of infection, bleeding, scarring, stiffness, nerve injury, vascular injury, tendon injury, need for subsequent operation, persistent stiffness, nonunion, malunion, hardware failure.  She voiced understanding of these risks and elected to proceed.  OPERATIVE COURSE: Patient was seen and identified in the preoperative area and marked appropriately.  Surgical consent had been signed. Preoperative IV antibiotic prophylaxis was given. She was  transferred to the operating room and placed in supine position with the Right upper extremity on an arm board.  Sedation was induced by the anesthesiologist. A regional block had been performed by anesthesia in preoperative holding.    Right upper extremity was prepped and draped in normal sterile orthopedic fashion.  A surgical pause was performed between the surgeons, anesthesia, and operating room staff and all were in agreement as to the patient, procedure, and site of procedure.  Tourniquet was placed and padded appropriately to the right upper arm.  We first began with manipulation under anesthesia of the right forearm.  Notable improvement in supination was achieved with the patient under anesthesia, we were able to achieve near complete supination with the elbow remaining in a neutral position which was not achieved in the office setting.  Distal radioulnar joint was then stressed and noted to have some laxity particularly in the pronated position in comparison to contralateral side.  Biplanar fluoroscopy was performed of the right wrist to confirm the distal radius hardware remained fully intact.  At this juncture, decision was made to proceed with distal radioulnar joint stabilization with pinning.  0.062 K wire was introduced from the ulna into the radius across the distal radioulnar joint with the arm in the supinated position.  Biplanar fluoroscopy was confirmed appropriate pin positioning.  Wire was then bent and cut appropriately.  Small area of punctate bleeding notable at the radial styloid region.  Small 1 cm incision was designed utilizing a 15 blade, careful dissection was performed down to expose underlying superficial vessel.  This was cauterized utilizing bipolar cautery.  4-0 nylon sutures were applied after copious irrigation.  Sterile dressings were then applied followed by application of a clamshell splint  utilizing plaster to immobilize the wrist.  Elbow was allowed for range  of motion.  The tourniquet was deflated at 11 minutes.  Fingertips were pink with brisk capillary refill after deflation of tourniquet.  The operative drapes were broken down.  Sling was applied.  The patient was awoken from anesthesia safely and taken to PACU in stable condition.   Post-operative plan: The patient will recover in the post-anesthesia care unit and then be discharged home.  The patient will be non weight bearing on the right upper extremity in a clamshell splint.   I will see the patient back in the office in 2 weeks for postoperative followup.    A surgical assistant was utilized throughout this procedure.  Their presence was helpful in protecting critical neurovascular structures and during the critical portions of the procedure.  The assistant was also critical in minimizing operative time and patient morbidity.   Kelsey Switalski, MD Electronically signed, 08/27/24

## 2024-08-27 NOTE — Anesthesia Preprocedure Evaluation (Addendum)
 Anesthesia Evaluation  Patient identified by MRN, date of birth, ID band  Reviewed: Allergy & Precautions, NPO status , Patient's Chart, lab work & pertinent test results  History of Anesthesia Complications Negative for: history of anesthetic complications  Airway Mallampati: II  TM Distance: >3 FB Neck ROM: Full    Dental no notable dental hx.    Pulmonary COPD,  COPD inhaler, Patient abstained from smoking., former smoker   Pulmonary exam normal        Cardiovascular negative cardio ROS Normal cardiovascular exam     Neuro/Psych Seizures -,  PSYCHIATRIC DISORDERS (PTSD) Anxiety Depression       GI/Hepatic negative GI ROS, Neg liver ROS,,,  Endo/Other  negative endocrine ROS    Renal/GU negative Renal ROS     Musculoskeletal  (+) Arthritis ,    Abdominal   Peds  Hematology negative hematology ROS (+)   Anesthesia Other Findings   Reproductive/Obstetrics                              Anesthesia Physical Anesthesia Plan  ASA: 2  Anesthesia Plan: MAC and Regional   Post-op Pain Management: Tylenol  PO (pre-op)*   Induction:   PONV Risk Score and Plan: 2 and Ondansetron , Scopolamine  patch - Pre-op, Propofol  infusion, TIVA and Midazolam   Airway Management Planned: Natural Airway and Simple Face Mask  Additional Equipment: None  Intra-op Plan:   Post-operative Plan:   Informed Consent: I have reviewed the patients History and Physical, chart, labs and discussed the procedure including the risks, benefits and alternatives for the proposed anesthesia with the patient or authorized representative who has indicated his/her understanding and acceptance.       Plan Discussed with: CRNA  Anesthesia Plan Comments: (Patient unsure about block because her last block wore off abruptly overnight. We discussed option of using Exparel  for likely more subtle return of sensation and she would  like to proceed with this option. Lawence, MD)         Anesthesia Quick Evaluation

## 2024-08-27 NOTE — Anesthesia Procedure Notes (Signed)
 Anesthesia Regional Block: Supraclavicular block   Pre-Anesthetic Checklist: , timeout performed,  Correct Patient, Correct Site, Correct Laterality,  Correct Procedure, Correct Position, site marked,  Risks and benefits discussed,  Pre-op evaluation,  At surgeon's request and post-op pain management  Laterality: Right  Prep: Maximum Sterile Barrier Precautions used, chloraprep       Needles:  Injection technique: Single-shot  Needle Type: Echogenic Stimulator Needle     Needle Length: 9cm  Needle Gauge: 22     Additional Needles:   Procedures:,,,, ultrasound used (permanent image in chart),,    Narrative:  Start time: 08/27/2024 11:15 AM End time: 08/27/2024 11:18 AM Injection made incrementally with aspirations every 5 mL.  Performed by: Personally  Anesthesiologist: Paul Lamarr BRAVO, MD  Additional Notes: Risks, benefits, and alternative discussed. Patient gave consent for procedure. Patient prepped and draped in sterile fashion. Sedation administered, patient remains easily responsive to voice. Relevant anatomy identified with ultrasound guidance. Local anesthetic given in 5cc increments with no signs or symptoms of intravascular injection. No pain or paraesthesias with injection. Patient monitored throughout procedure with signs of LAST or immediate complications. Tolerated well. Ultrasound image placed in chart.  LANEY Paul, MD

## 2024-08-27 NOTE — Progress Notes (Signed)
Assisted Dr. Howze with right, supraclavicular, ultrasound guided block. Side rails up, monitors on throughout procedure. See vital signs in flow sheet. Tolerated Procedure well. 

## 2024-08-29 NOTE — Anesthesia Postprocedure Evaluation (Signed)
 Anesthesia Post Note  Patient: Kelsey Walton  Procedure(s) Performed: RIGHT WRIST MANIPULATION UNDER ANESTHESIA, DISTAL RADIOULNAR JOINT STABILIZATIOIN WITH PINNING (Right: Wrist)     Patient location during evaluation: PACU Anesthesia Type: Regional Level of consciousness: awake and alert Pain management: pain level controlled Vital Signs Assessment: post-procedure vital signs reviewed and stable Respiratory status: spontaneous breathing, nonlabored ventilation and respiratory function stable Cardiovascular status: blood pressure returned to baseline Postop Assessment: no apparent nausea or vomiting Anesthetic complications: no   No notable events documented.  Last Vitals:  Vitals:   08/27/24 1300 08/27/24 1322  BP: 122/65 116/71  Pulse: 82 73  Resp: 16 18  Temp:  (!) 36.3 C  SpO2: 97% 100%    Last Pain:  Vitals:   08/27/24 1322  TempSrc: Temporal  PainSc:                  [name]

## 2024-08-30 ENCOUNTER — Encounter (HOSPITAL_BASED_OUTPATIENT_CLINIC_OR_DEPARTMENT_OTHER): Payer: Self-pay | Admitting: Orthopedic Surgery

## 2024-09-01 ENCOUNTER — Ambulatory Visit: Admitting: Orthopedic Surgery

## 2024-09-09 ENCOUNTER — Ambulatory Visit: Admitting: Orthopedic Surgery

## 2024-09-09 ENCOUNTER — Other Ambulatory Visit: Payer: Self-pay | Admitting: Orthopedic Surgery

## 2024-09-09 ENCOUNTER — Ambulatory Visit (INDEPENDENT_AMBULATORY_CARE_PROVIDER_SITE_OTHER): Payer: Self-pay

## 2024-09-09 ENCOUNTER — Ambulatory Visit: Attending: Family Medicine | Admitting: Occupational Therapy

## 2024-09-09 DIAGNOSIS — R29818 Other symptoms and signs involving the nervous system: Secondary | ICD-10-CM | POA: Insufficient documentation

## 2024-09-09 DIAGNOSIS — R29898 Other symptoms and signs involving the musculoskeletal system: Secondary | ICD-10-CM | POA: Diagnosis present

## 2024-09-09 DIAGNOSIS — M79641 Pain in right hand: Secondary | ICD-10-CM | POA: Insufficient documentation

## 2024-09-09 DIAGNOSIS — R278 Other lack of coordination: Secondary | ICD-10-CM | POA: Diagnosis present

## 2024-09-09 DIAGNOSIS — M6281 Muscle weakness (generalized): Secondary | ICD-10-CM | POA: Insufficient documentation

## 2024-09-09 DIAGNOSIS — M25641 Stiffness of right hand, not elsewhere classified: Secondary | ICD-10-CM | POA: Diagnosis present

## 2024-09-09 DIAGNOSIS — M25531 Pain in right wrist: Secondary | ICD-10-CM | POA: Insufficient documentation

## 2024-09-09 DIAGNOSIS — Z8781 Personal history of (healed) traumatic fracture: Secondary | ICD-10-CM | POA: Diagnosis not present

## 2024-09-09 DIAGNOSIS — R208 Other disturbances of skin sensation: Secondary | ICD-10-CM | POA: Insufficient documentation

## 2024-09-09 DIAGNOSIS — M25331 Other instability, right wrist: Secondary | ICD-10-CM

## 2024-09-09 DIAGNOSIS — Z9889 Other specified postprocedural states: Secondary | ICD-10-CM | POA: Diagnosis not present

## 2024-09-09 DIAGNOSIS — R600 Localized edema: Secondary | ICD-10-CM | POA: Diagnosis present

## 2024-09-09 DIAGNOSIS — M25631 Stiffness of right wrist, not elsewhere classified: Secondary | ICD-10-CM | POA: Diagnosis present

## 2024-09-09 MED ORDER — OXYCODONE HCL 5 MG PO TABS: 5.0000 mg | ORAL_TABLET | Freq: Four times a day (QID) | ORAL | 0 refills | Status: AC | PRN

## 2024-09-09 MED ORDER — CYCLOBENZAPRINE HCL 10 MG PO TABS: 10.0000 mg | ORAL_TABLET | Freq: Three times a day (TID) | ORAL | 0 refills | Status: DC | PRN

## 2024-09-09 NOTE — Patient Instructions (Signed)
 mod

## 2024-09-09 NOTE — Therapy (Signed)
 OUTPATIENT OCCUPATIONAL THERAPY ORTHO EVALUATION and TREATMENT  Patient Name: Kelsey Walton MRN: [id number] DOB:[birth year], 43 y.o., female Today's Date: 09/09/2024  PCP: Adina Buel HERO, MD REFERRING PROVIDER: [name] Buster, MD   END OF SESSION:  OT End of Session - 09/09/24 1115     Visit Number 1    Number of Visits 16    Date for Recertification  11/09/24    Authorization Type [employer]    OT Start Time 1020    OT Stop Time 1115    OT Time Calculation (min) 55 min    Activity Tolerance Patient limited by pain    Behavior During Therapy Restless          Past Medical History:  Diagnosis Date   Anxiety    Arthritis    Depression    PTSD (post-traumatic stress disorder)    Seizures (HCC) 09/26/2019   teeth clencing/ anxiety are symtoms of seizures   Past Surgical History:  Procedure Laterality Date   ABDOMINAL HYSTERECTOMY N/A 06/30/2019   Procedure: HYSTERECTOMY ABDOMINAL;  Surgeon: Jayne Vonn DEL, MD;  Location: [facility];  Service: Gynecology;  Laterality: N/A;   ARTHROTOMY,JOINT STABILIZATIOIN,W/TRIANGULAR FIBROCARTILAGE COMPLEX REPAIR Right 08/27/2024   Procedure: RIGHT WRIST MANIPULATION UNDER ANESTHESIA, DISTAL RADIOULNAR JOINT STABILIZATIOIN WITH PINNING;  Surgeon: [name] Buster, MD;  Location: [facility];  Service: Orthopedics;  Laterality: Right;   AUGMENTATION MAMMAPLASTY Bilateral 2008   saline   BILATERAL SALPINGECTOMY Bilateral 06/30/2019   Procedure: OPEN BILATERAL SALPINGECTOMY;  Surgeon: Jayne Vonn DEL, MD;  Location: [facility];  Service: Gynecology;  Laterality: Bilateral;   DILATION AND CURETTAGE OF UTERUS     OPEN REDUCTION INTERNAL FIXATION (ORIF) DISTAL RADIAL FRACTURE Right 06/08/2024   Procedure: OPEN REDUCTION INTERNAL FIXATION (ORIF) DISTAL RADIUS FRACTURE;  Surgeon: [name] Buster, MD;  Location: [facility];  Service: Orthopedics;  Laterality: Right;  RIGHT WRIST OPEN REDUCTION INTERNAL FIXATION DISTAL RADIUS  AND POSSIBLE ULNAR STYLOID   TUBAL LIGATION     Patient Active Problem List   Diagnosis Date Noted   DRUJ (distal radioulnar joint) instability, post-traumatic, right 08/27/2024   Wrist stiffness, right 08/27/2024   Fracture, [place], right, closed 06/08/2024   S/P hysterectomy 06/30/2019   Breast mass, right 07/17/2015    ONSET DATE: 08/16/2024   REFERRING DIAG: Z98.890,Z87.81 (ICD-10-CM) - S/P ORIF (open reduction internal fixation) fracture   Needs OT 2 weeks s/p right wrist stabilization and joint exploration 08/27/24   PROCEDURE 08/27/24 Right wrist manipulation under anesthesia Distal radioulnar joint stabilization with pinning  THERAPY DIAG:  Pain in right wrist  Stiffness of right hand, not elsewhere classified  Stiffness of right wrist, not elsewhere classified  Other lack of coordination  Muscle weakness (generalized)  Other disturbances of skin sensation  Localized edema  Rationale for Evaluation and Treatment: Rehabilitation  SUBJECTIVE:   SUBJECTIVE STATEMENT: I wanted them to cast me because of the pain I had when they took the pin out today and b/c I have my daughter's birthday party this Saturday and I need it protected. Cast comes off in 2 weeks  Pt accompanied by: self  PERTINENT HISTORY: Right distal radius fracture open reduction internal fixation 06/08/24. PMH: Anxiety, seizures, PTSD   PRECAUTIONS: Other: casted for 2 weeks - no wrist ROM at this time  RED FLAGS: Extreme pain/stiffness   WEIGHT BEARING RESTRICTIONS: Yes NWB through RUE/hand  PAIN:  Are you having pain? Yes: NPRS scale: 3-10/10 but 10/10 right now after  pulling out pin Pain location: Rt wrist ulnar side Pain description: constant and sharp Aggravating factors: pulling pin out Relieving factors: rest/supporting arm  FALLS: Has patient fallen in last 6 months? Yes. Number of falls 1  LIVING ENVIRONMENT: Lives with: daughters live with patient (73 and 6)  Lives in:  House/apartment Stairs: 7 steps to enter, 1 level  Has following equipment at home: None  PLOF: Independent  PATIENT GOALS: GET MY LIFE BACK   NEXT MD VISIT: 2 weeks  OBJECTIVE:  Note: Objective measures were completed at Evaluation unless otherwise noted.  HAND DOMINANCE: Right  ADLs: Overall ADLs: mod I w/ BADLS using Lt hand however requires assist prn for 2 handed tasks and some IADLS  FUNCTIONAL OUTCOME MEASURES: Quick Dash: 68.18% deficit (d/t RUE)   UPPER EXTREMITY ROM:   RUE: shoulder WFL's but difficulty and prone to stiffness, see below for elbow flex/ext, pt demo no active sup/pron today, unable to assess wrist d/t cast, finger and thumb ROM limited w/ thumb opposition barely to index finger only, MP flexion limited d/t cast, PIP flex digits 2-5 approx 75% and composite flexion less than 50%  Active ROM Right eval Left eval  Shoulder flexion WFL's   Shoulder abduction    Shoulder adduction    Shoulder extension    Shoulder internal rotation    Shoulder external rotation    Elbow flexion 130*   Elbow extension -43*   Wrist flexion    Wrist extension    Wrist ulnar deviation    Wrist radial deviation    Wrist pronation unable   Wrist supination unable   (Blank rows = not tested)    UPPER EXTREMITY MMT:   Not tested d/t pain and precautions at wrist/forearm  MMT Right eval Left eval  Shoulder flexion    Shoulder abduction    Shoulder adduction    Shoulder extension    Shoulder internal rotation    Shoulder external rotation    Middle trapezius    Lower trapezius    Elbow flexion    Elbow extension    Wrist flexion    Wrist extension    Wrist ulnar deviation    Wrist radial deviation    Wrist pronation    Wrist supination    (Blank rows = not tested)  HAND FUNCTION: Pt has no grip strength Rt hand  COORDINATION: Unable to pick up anything small Rt hand d/t limited ROM  SENSATION: Not tested  EDEMA: Mild at  fingers  COGNITION: Overall cognitive status: pt limited by pain and restless/anxious. Difficulty focusing on therapist when trying to review and update HEP  Areas of impairment: attention, behavior  OBSERVATIONS: Pt arrived in cast directly from MD office. Pin removed and casted prior to this O.T. evaluation   TREATMENT DATE: 09/09/24  Re-issued HEP from 07/29/24 however not including passive forearm supination and wrist ROM (d/t cast), w/ focus on finger and thumb ROM and elbow flex/extension. Pt also instructed to add P/ROM to fingers/thumb after A/ROM, and to add A/ROM in shoulder flex and abduction. Pt had difficulty attending to instructions d/t pain.     PATIENT EDUCATION: Education details: see above (reprint of 07/29/24 HEP except last 3 exercises, verbally instructed to add PROM to fingers/thumb, and shoulder ROM ex's)  Person educated: Patient Education method: Explanation, Demonstration, Verbal cues, and Handouts Education comprehension: verbalized understanding, returned demonstration, verbal cues required, and needs further education  HOME EXERCISE PROGRAM: 09/09/24: reprint of 07/29/24 HEP except last 3 exercises, verbally instructed to add PROM to fingers/thumb, and shoulder ROM ex's  GOALS: Goals reviewed with patient? Yes  SHORT TERM GOALS: Target date: 10/10/24  Independent with appropriate HEP following recent sx Baseline: issued, will need updates/modifications when cast comes off Goal status: IN PROGRESS  2.  Pt to improve elbow extension to -30* or less RUE Baseline: -43* Goal status: INITIAL  3.  Pt to have 75% or greater composite flexion Rt hand for gross grasp/release Baseline: less than 50% Goal status: INITIAL  4.  Pt to report pain less than or equal to 5/10 with HEP Baseline: 10/10 Goal status: INITIAL  5.  Pt to oppose thumb  to ring finger for functional thumb use/movement Baseline: max difficulty to index finger Goal status: INITIAL  6.  Pt to use RUE as min assist for bilateral tasks Baseline: dependent at this time Goal status: INITIAL  LONG TERM GOALS: Target date: 11/09/24  Independent with updated P/ROM and strengthening HEP  Baseline: not yet issued d/t current precautions/cast Goal status: INITIAL  2.  Pt to demo 30* or greater wrist flex/ext RUE for functional use Baseline: unable to assess d/t cast Goal status: INITIAL  3.  Pt to demo 45* forearm pronation and supination RUE Baseline: Neutral - unable at this time d/t stiffness and pain Goal status: INITIAL  4.  RUE elbow flexion to 140* and extension to -25* or less Baseline: 130*, -43*  Goal status: INITIAL  5.  Pt to return to using Rt hand for eating, grooming, and writing 90% or more of the time Baseline: currently using Lt non dominant hand Goal status: INITIAL  6.  Quick Dash to improve to 45% or less deficit RUE Baseline: 68.18% Goal status: INITIAL  ASSESSMENT:  CLINICAL IMPRESSION: Patient is a 43 y.o. female who was seen today for occupational therapy evaluation for therapy to Rt dominant UE s/p 2nd surgery on 08/27/24 for manipulation and distal radioulnar joint stabilization w/ pinning. Pt presents today w/ pin removed and wrist casted coming directly from MD office. Pt familiar to this clinic from recent previous admission following initial surgery on 06/08/24 d/t Rt distal radius fx with ORIF. Pt presents today with severe pain at wrist after pin removal today, significant stiffness in all fingers and thumb, limited to no forearm rotation, mild swelling, and overall decreased use/function Rt dominant UE. Pt would benefit from skilled O.T. to address these deficits, increase use of dominant RUE for ADLS and leisure activities (hunting), and decrease pain.   PERFORMANCE DEFICITS: in functional skills including ADLs, IADLs,  coordination, dexterity, proprioception, sensation, edema, ROM, strength, pain, Fine motor control, Gross motor control, body mechanics, decreased knowledge of precautions, decreased knowledge of use of DME, and UE functional use, cognitive skills including emotional, and psychosocial skills including coping strategies.  IMPAIRMENTS: are limiting patient from ADLs, IADLs, rest and sleep, work, and leisure.   COMORBIDITIES: may have co-morbidities  that affects occupational performance. Patient will benefit from skilled OT to address above impairments and improve overall function.  MODIFICATION OR ASSISTANCE TO COMPLETE EVALUATION: Min-Moderate modification of tasks or assist with assess necessary to complete an evaluation.  OT OCCUPATIONAL PROFILE AND HISTORY: Detailed assessment: Review of records and additional review of physical, cognitive, psychosocial history related to current functional performance.  CLINICAL DECISION MAKING: Moderate - several treatment options, min-mod task modification necessary  REHAB POTENTIAL: Fair limited by pain, significant stiffness, time since onset of initial injury  EVALUATION COMPLEXITY: Moderate      PLAN:  OT FREQUENCY: 2x/week  OT DURATION: 8 weeks  PLANNED INTERVENTIONS: 97535 self care/ADL training, 02889 therapeutic exercise, 97530 therapeutic activity, 97140 manual therapy, 97113 aquatic therapy, 97035 ultrasound, 97018 paraffin, 02960 fluidotherapy, 97010 moist heat, 97010 cryotherapy, 97034 contrast bath, 97032 electrical stimulation (manual), 97014 electrical stimulation unattended, 97760 Orthotic Initial, 97763 Orthotic/Prosthetic subsequent, scar mobilization, passive range of motion, compression bandaging, coping strategies training, patient/family education, and DME and/or AE instructions  RECOMMENDED OTHER SERVICES: none at this time  CONSULTED AND AGREED WITH PLAN OF CARE: Patient  PLAN FOR NEXT SESSION: review/update HEP prn,  ROM  For all possible CPT codes, reference the Planned Interventions line above.     Check all conditions that are expected to impact treatment: {Conditions expected to impact treatment:Neurological condition and/or seizures and Psychological or psychiatric disorders   If treatment provided at initial evaluation, no treatment charged due to lack of authorization.        Burnard JINNY Roads, OT 09/09/2024, 11:24 AM

## 2024-09-09 NOTE — Progress Notes (Signed)
   Kelsey Walton - 43 y.o. female MRN [id number]  Date of birth: 1981/05/24  Office Visit Note: Visit Date: 09/09/2024 PCP: Adina Buel HERO, MD Referred by: Adina Buel HERO, MD  Subjective:  HPI: Kelsey Walton is a 43 y.o. female who presents today for follow up 2 weeks status post right wrist manipulation under anesthesia. Distal radioulnar joint stabilization with pinning.  Pertinent ROS were reviewed with the patient and found to be negative unless otherwise specified above in HPI.   Assessment & Plan: Visit Diagnoses:  1. 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  I did discuss with her the extent of the manipulation under under anesthesia which was able to achieve significant supination of the wrist.  Short arm cast applied today per patient request instead of the removable orthosis with occupational therapy.  She should still continue with digital range of motion exercises.  Return to me in approximate 2 weeks for cast removal.  Follow-up: No follow-ups on file.   Meds & Orders: No orders of the defined types were placed in this encounter.   Orders Placed This Encounter  Procedures   XR Wrist Complete Right     Procedures: No procedures performed       Objective:   Vital Signs: LMP 05/28/2019 (Within Days)   Ortho Exam Right wrist pin removed today, no erythema or drainage, wrist held in a slightly supinated posture, able to achieve gentle pronation and supination today after pin removal, digital range of motion is limited secondary to pain, sensation intact in median/radial/ulnar distributions, AIN/PIN/interosseous intact  Imaging: No results found. X-rays of the right wrist obtained today   Shela Esses Estela) [name], M.D. [facility], Hand Surgery

## 2024-09-16 ENCOUNTER — Ambulatory Visit: Admitting: Occupational Therapy

## 2024-09-23 ENCOUNTER — Ambulatory Visit: Admitting: Occupational Therapy

## 2024-09-23 ENCOUNTER — Other Ambulatory Visit: Payer: Self-pay

## 2024-09-23 ENCOUNTER — Ambulatory Visit: Admitting: Orthopedic Surgery

## 2024-09-23 DIAGNOSIS — R600 Localized edema: Secondary | ICD-10-CM

## 2024-09-23 DIAGNOSIS — M79641 Pain in right hand: Secondary | ICD-10-CM

## 2024-09-23 DIAGNOSIS — R208 Other disturbances of skin sensation: Secondary | ICD-10-CM

## 2024-09-23 DIAGNOSIS — M25641 Stiffness of right hand, not elsewhere classified: Secondary | ICD-10-CM

## 2024-09-23 DIAGNOSIS — M25331 Other instability, right wrist: Secondary | ICD-10-CM

## 2024-09-23 DIAGNOSIS — M25631 Stiffness of right wrist, not elsewhere classified: Secondary | ICD-10-CM

## 2024-09-23 DIAGNOSIS — R29898 Other symptoms and signs involving the musculoskeletal system: Secondary | ICD-10-CM

## 2024-09-23 DIAGNOSIS — M25531 Pain in right wrist: Secondary | ICD-10-CM | POA: Diagnosis not present

## 2024-09-23 DIAGNOSIS — M6281 Muscle weakness (generalized): Secondary | ICD-10-CM

## 2024-09-23 DIAGNOSIS — R278 Other lack of coordination: Secondary | ICD-10-CM

## 2024-09-23 DIAGNOSIS — Z8781 Personal history of (healed) traumatic fracture: Secondary | ICD-10-CM

## 2024-09-23 DIAGNOSIS — R29818 Other symptoms and signs involving the nervous system: Secondary | ICD-10-CM
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
## 2024-09-23 NOTE — Progress Notes (Unsigned)
   Kelsey Walton - 43 y.o. female MRN [id number]  Date of birth: [birth year]  Office Visit Note: Visit Date: 09/23/2024 PCP: Adina Buel HERO, MD Referred by: Adina Buel HERO, MD  Subjective:  HPI: Kelsey Walton is a 43 y.o. female who presents today for follow up 4 weeks status post right wrist manipulation under anesthesia and distal radioulnar joint stabilization with pinning.  Pertinent ROS were reviewed with the patient and found to be negative unless otherwise specified above in HPI.   Assessment & Plan: Visit Diagnoses: No diagnosis found.  Plan: ***  Follow-up: No follow-ups on file.   Meds & Orders: No orders of the defined types were placed in this encounter.  No orders of the defined types were placed in this encounter.    Procedures: No procedures performed       Objective:   Vital Signs: LMP 05/28/2019 (Within Days)   Ortho Exam ***  Imaging: No results found.   Anshul Afton Alderton, M.D. [facility], Hand Surgery

## 2024-09-28 ENCOUNTER — Ambulatory Visit: Attending: Family Medicine | Admitting: Occupational Therapy

## 2024-09-28 DIAGNOSIS — R600 Localized edema: Secondary | ICD-10-CM | POA: Insufficient documentation

## 2024-09-28 DIAGNOSIS — R29818 Other symptoms and signs involving the nervous system: Secondary | ICD-10-CM | POA: Insufficient documentation

## 2024-09-28 DIAGNOSIS — R29898 Other symptoms and signs involving the musculoskeletal system: Secondary | ICD-10-CM | POA: Diagnosis present

## 2024-09-28 DIAGNOSIS — M25531 Pain in right wrist: Secondary | ICD-10-CM | POA: Diagnosis present

## 2024-09-28 DIAGNOSIS — R278 Other lack of coordination: Secondary | ICD-10-CM | POA: Insufficient documentation

## 2024-09-28 DIAGNOSIS — M25631 Stiffness of right wrist, not elsewhere classified: Secondary | ICD-10-CM | POA: Diagnosis present

## 2024-09-28 DIAGNOSIS — R208 Other disturbances of skin sensation: Secondary | ICD-10-CM | POA: Insufficient documentation

## 2024-09-28 DIAGNOSIS — M79641 Pain in right hand: Secondary | ICD-10-CM | POA: Insufficient documentation

## 2024-09-28 DIAGNOSIS — M25641 Stiffness of right hand, not elsewhere classified: Secondary | ICD-10-CM | POA: Diagnosis present

## 2024-09-28 DIAGNOSIS — M6281 Muscle weakness (generalized): Secondary | ICD-10-CM | POA: Diagnosis present

## 2024-09-28 NOTE — Therapy (Signed)
 OUTPATIENT OCCUPATIONAL THERAPY ORTHO TREATMENT  Patient Name: Kelsey Walton MRN: [id number] DOB:11-25-1981, 43 y.o., female Today's Date: 09/28/2024  PCP: Adina Buel HERO, MD REFERRING PROVIDER: [name] Buster, MD   END OF SESSION:  OT End of Session - 09/28/24 1156     Visit Number 3    Number of Visits 16    Date for Recertification  11/09/24    Authorization Type [employer]    OT Start Time 1155   pt arriving late to therapy appointment   OT Stop Time 1233    OT Time Calculation (min) 38 min    Activity Tolerance Patient tolerated treatment well    Behavior During Therapy [facility] for tasks assessed/performed         Past Medical History:  Diagnosis Date   Anxiety    Arthritis    Depression    PTSD (post-traumatic stress disorder)    Seizures (HCC) 09/26/2019   teeth clencing/ anxiety are symtoms of seizures   Past Surgical History:  Procedure Laterality Date   ABDOMINAL HYSTERECTOMY N/A 06/30/2019   Procedure: HYSTERECTOMY ABDOMINAL;  Surgeon: Jayne Vonn DEL, MD;  Location: [facility];  Service: Gynecology;  Laterality: N/A;   ARTHROTOMY,JOINT STABILIZATIOIN,W/TRIANGULAR FIBROCARTILAGE COMPLEX REPAIR Right 08/27/2024   Procedure: RIGHT WRIST MANIPULATION UNDER ANESTHESIA, DISTAL RADIOULNAR JOINT STABILIZATIOIN WITH PINNING;  Surgeon: [name] Buster, MD;  Location: [facility];  Service: Orthopedics;  Laterality: Right;   AUGMENTATION MAMMAPLASTY Bilateral 2008   saline   BILATERAL SALPINGECTOMY Bilateral 06/30/2019   Procedure: OPEN BILATERAL SALPINGECTOMY;  Surgeon: Jayne Vonn DEL, MD;  Location: [facility];  Service: Gynecology;  Laterality: Bilateral;   DILATION AND CURETTAGE OF UTERUS     OPEN REDUCTION INTERNAL FIXATION (ORIF) DISTAL RADIAL FRACTURE Right 06/08/2024   Procedure: OPEN REDUCTION INTERNAL FIXATION (ORIF) DISTAL RADIUS FRACTURE;  Surgeon: [name] Buster, MD;  Location: [facility];  Service: Orthopedics;  Laterality: Right;   RIGHT WRIST OPEN REDUCTION INTERNAL FIXATION DISTAL RADIUS AND POSSIBLE ULNAR STYLOID   TUBAL LIGATION     Patient Active Problem List   Diagnosis Date Noted   DRUJ (distal radioulnar joint) instability, post-traumatic, right 08/27/2024   Wrist stiffness, right 08/27/2024   Fracture, Colles, right, closed 06/08/2024   S/P hysterectomy 06/30/2019   Breast mass, right 07/17/2015    ONSET DATE: 08/16/2024   REFERRING DIAG: Z98.890,Z87.81 (ICD-10-CM) - S/P ORIF (open reduction internal fixation) fracture   Needs OT 2 weeks s/p right wrist stabilization and joint exploration 08/27/24   PROCEDURE 08/27/24 Right wrist manipulation under anesthesia Distal radioulnar joint stabilization with pinning  THERAPY DIAG:  Pain in right wrist  Stiffness of right hand, not elsewhere classified  Stiffness of right wrist, not elsewhere classified  Other lack of coordination  Muscle weakness (generalized)  Other disturbances of skin sensation  Localized edema  Pain in right hand  Other symptoms and signs involving the musculoskeletal system  Other symptoms and signs involving the nervous system  Rationale for Evaluation and Treatment: Rehabilitation  SUBJECTIVE:   SUBJECTIVE STATEMENT: She was able to get into a ladder tree stand using R forearm to help hook the ladder.   Pt reports tender spot palmarly to radial side of distal wrist.   Pt accompanied by: self  PERTINENT HISTORY: Right distal radius fracture open reduction internal fixation 06/08/24. PMH: Anxiety, seizures, PTSD   PRECAUTIONS: Other: to be updated  RED FLAGS: Extreme pain/stiffness   WEIGHT BEARING RESTRICTIONS: Yes to be updated  PAIN:  Are you having pain? Yes: NPRS scale: 3-10/10 Pain location: RUE Pain description: sore Aggravating factors: certain movements Relieving factors: rest/supporting arm  FALLS: Has patient fallen in last 6 months? Yes. Number of falls 1  LIVING ENVIRONMENT: Lives with:  daughters live with patient (19 and 67)  Lives in: House/apartment Stairs: 7 steps to enter, 1 level  Has following equipment at home: None  PLOF: Independent  PATIENT GOALS: GET MY LIFE BACK   NEXT MD VISIT: 2 weeks  OBJECTIVE:  Note: Objective measures were completed at Evaluation unless otherwise noted.  HAND DOMINANCE: Right  ADLs: Overall ADLs: mod I w/ BADLS using Lt hand however requires assist prn for 2 handed tasks and some IADLS  FUNCTIONAL OUTCOME MEASURES: Quick Dash: 68.18% deficit (d/t RUE)   UPPER EXTREMITY ROM:   RUE: shoulder WFL's but difficulty and prone to stiffness, see below for elbow flex/ext, pt demo no active sup/pron today, unable to assess wrist d/t cast, finger and thumb ROM limited w/ thumb opposition barely to index finger only, MP flexion limited d/t cast, PIP flex digits 2-5 approx 75% and composite flexion less than 50%  Active ROM Right eval Left eval  Shoulder flexion WFL's   Elbow flexion 130*   Elbow extension -43*   Wrist pronation unable   Wrist supination unable   (Blank rows = not tested)   UPPER EXTREMITY MMT:   Not tested d/t pain and precautions at wrist/forearm  HAND FUNCTION: 09/23/2024: Grip strength: Right: 9.9 lbs; Left: 59 lbs  COORDINATION: Unable to pick up anything small Rt hand d/t limited ROM  SENSATION: Not tested  EDEMA: Mild at fingers  COGNITION: Overall cognitive status: pt limited by pain and restless/anxious. Difficulty focusing on therapist when trying to review and update HEP  Areas of impairment: attention, behavior  OBSERVATIONS: Pt arrived in cast directly from MD office. Pin removed and casted prior to this O.T. evaluation  TREATMENT:  - Manual therapy completed for duration as noted below including:  Performed dynamic cupping at site of palmar wrist incision and surrounding area to decrease scar tissue adhesions by mobilizing the soft- tissues such as skin, fascia, neural tissues, muscles,  ligaments and tendons and to promote local circulation necessary for optimal functional movement by lifting and separating the tissue underneath the cup. Improved appearance to incision noted upon completion with less palpable adhesions.  Distraction of R wrist provided x10 to promote movement and pain reduction of affected extremity.   PNF facilitation provided to R wrist to facilitate extension of affected extremity x10.   - Therapeutic exercises completed for duration as noted below including:       Therapist assisted with PROM RUE supination, wrist ext, and digit flexion  PATIENT EDUCATION: Education details: ROM, scar massage Person educated: Patient Education method: Explanation, Demonstration, Tactile cues, and Verbal cues Education comprehension: verbalized understanding, returned demonstration, verbal cues required, and needs further education  HOME EXERCISE PROGRAM: 09/09/24: reprint of 07/29/24 HEP except last 3 exercises, verbally instructed to add PROM to fingers/thumb, and shoulder ROM ex's 09/23/2024: putty and hammer HEP  GOALS: Goals reviewed with patient? Yes  SHORT TERM GOALS: Target date: 10/10/24  Independent with appropriate HEP following recent sx Baseline: issued, will need updates/modifications when cast comes off Goal status: IN PROGRESS  2.  Pt to improve elbow extension to -30* or less RUE Baseline: -43* Goal status: IN PROGRESS  3.  Pt to have 75% or greater composite flexion Rt hand for gross grasp/release Baseline:  less than 50% Goal status: INITIAL  4.  Pt to report pain less than or equal to 5/10 with HEP Baseline: 10/10 Goal status: INITIAL  5.  Pt to oppose thumb to ring finger for functional thumb use/movement Baseline: max difficulty to index finger Goal status: INITIAL  6.  Pt to use RUE as min assist for bilateral tasks Baseline: dependent at this time Goal status: INITIAL  LONG TERM GOALS: Target date: 11/09/24  Independent with  updated P/ROM and strengthening HEP  Baseline: not yet issued d/t current precautions/cast Goal status: INITIAL  2.  Pt to demo 30* or greater wrist flex/ext RUE for functional use Baseline: unable to assess d/t cast Goal status: INITIAL  3.  Pt to demo 45* forearm pronation and supination RUE Baseline: Neutral - unable at this time d/t stiffness and pain Goal status: INITIAL  4.  RUE elbow flexion to 140* and extension to -25* or less Baseline: 130*, -43*  Goal status: INITIAL  5.  Pt to return to using Rt hand for eating, grooming, and writing 90% or more of the time Baseline: currently using Lt non dominant hand Goal status: INITIAL  6.  Quick Dash to improve to 45% or less deficit RUE Baseline: 68.18% Goal status: INITIAL  ASSESSMENT:  CLINICAL IMPRESSION: Patient demonstrates WNL RUE PROM with some discomfort and prolonged stretch. Tender spot at wrist consistent with scar tissue or fibrotic soft tissue. Recommend further soft tissue management with cupping, IASTM, or taping as needed for future visits.   PERFORMANCE DEFICITS: in functional skills including ADLs, IADLs, coordination, dexterity, proprioception, sensation, edema, ROM, strength, pain, Fine motor control, Gross motor control, body mechanics, decreased knowledge of precautions, decreased knowledge of use of DME, and UE functional use, cognitive skills including emotional, and psychosocial skills including coping strategies.   IMPAIRMENTS: are limiting patient from ADLs, IADLs, rest and sleep, work, and leisure.   COMORBIDITIES: may have co-morbidities  that affects occupational performance. Patient will benefit from skilled OT to address above impairments and improve overall function.  REHAB POTENTIAL: Fair limited by pain, significant stiffness, time since onset of initial injury  PLAN:  OT FREQUENCY: 2x/week  OT DURATION: 8 weeks  PLANNED INTERVENTIONS: 97535 self care/ADL training, 02889 therapeutic  exercise, 97530 therapeutic activity, 97140 manual therapy, 97113 aquatic therapy, 97035 ultrasound, 97018 paraffin, 02960 fluidotherapy, 97010 moist heat, 97010 cryotherapy, 97034 contrast bath, 97032 electrical stimulation (manual), 97014 electrical stimulation unattended, 97760 Orthotic Initial, 97763 Orthotic/Prosthetic subsequent, scar mobilization, passive range of motion, compression bandaging, coping strategies training, patient/family education, and DME and/or AE instructions  RECOMMENDED OTHER SERVICES: none at this time  CONSULTED AND AGREED WITH PLAN OF CARE: Patient  PLAN FOR NEXT SESSION: review/update HEP prn (including putty), ROM Tape, IASTM, or cupping as needed    For all possible CPT codes, reference the Planned Interventions line above.     Check all conditions that are expected to impact treatment: {Conditions expected to impact treatment:Neurological condition and/or seizures and Psychological or psychiatric disorders   If treatment provided at initial evaluation, no treatment charged due to lack of authorization.        Jocelyn CHRISTELLA Bottom, OT 09/28/2024, 11:59 AM

## 2024-10-01 ENCOUNTER — Ambulatory Visit: Admitting: Occupational Therapy

## 2024-10-05 ENCOUNTER — Ambulatory Visit: Admitting: Occupational Therapy

## 2024-10-06 NOTE — Progress Notes (Signed)
   Kelsey Walton - 43 y.o. female MRN [id number]  Date of birth: 1981/08/20  Office Visit Note: Visit Date: 10/07/2024 PCP: Adina Buel HERO, MD Referred by: Adina Buel HERO, MD  Subjective:  HPI: Kelsey Walton is a 43 y.o. female who presents today for follow up 6 weeks status post right wrist manipulation under anesthesia. Distal radioulnar joint stabilization with pinning.  Pain is controlled, has been unable to do therapy lately, feels that stiffening is worsening.  Pertinent ROS were reviewed with the patient and found to be negative unless otherwise specified above in HPI.   Assessment & Plan: Visit Diagnoses:  1. S/P ORIF (open reduction internal fixation) fracture     Plan: Extensive discussion was had with the patient today regarding the importance of doing therapy moving forward in order to maintain her rotation of the forearm.  She is able to perform some supination and pronation today however it is limited from her prior visit, likely due to ongoing immobilization and lack of ongoing therapy.  No significant DRUJ instability currently.  X-rays demonstrate stable appearance of the distal radius fixation.  I did explain that in order to maximize her results, the therapeutic regiment is paramount.  Repeat prescription for muscle relaxers was presented today.  Follow-up in approxi-6 weeks  Follow-up: No follow-ups on file.   Meds & Orders: No orders of the defined types were placed in this encounter.   Orders Placed This Encounter  Procedures   XR Wrist Complete Right     Procedures: No procedures performed       Objective:   Vital Signs: LMP 05/28/2019 (Within Days)   Ortho Exam Right wrist with well-healed volar incision, pronation/supination 45/45, active and passive, no DRUJ instability with stress testing, composite fist attempted, approximately 2 cm from distal palmar crease actively, improved passively, sensation intact distally in all distributions,  AIN/PIN/interosseous intact  Imaging: No results found.   Nghia Mcentee Afton Alderton, M.D. [facility], Hand Surgery

## 2024-10-07 ENCOUNTER — Encounter: Payer: Self-pay | Admitting: Occupational Therapy

## 2024-10-07 ENCOUNTER — Other Ambulatory Visit (INDEPENDENT_AMBULATORY_CARE_PROVIDER_SITE_OTHER): Payer: Self-pay

## 2024-10-07 ENCOUNTER — Ambulatory Visit: Admitting: Occupational Therapy

## 2024-10-07 ENCOUNTER — Ambulatory Visit: Admitting: Orthopedic Surgery

## 2024-10-07 ENCOUNTER — Other Ambulatory Visit: Payer: Self-pay | Admitting: Orthopedic Surgery

## 2024-10-07 DIAGNOSIS — M25631 Stiffness of right wrist, not elsewhere classified: Secondary | ICD-10-CM

## 2024-10-07 DIAGNOSIS — M25531 Pain in right wrist: Secondary | ICD-10-CM

## 2024-10-07 DIAGNOSIS — Z9889 Other specified postprocedural states: Secondary | ICD-10-CM | POA: Diagnosis not present

## 2024-10-07 DIAGNOSIS — Z8781 Personal history of (healed) traumatic fracture: Secondary | ICD-10-CM | POA: Diagnosis not present

## 2024-10-07 DIAGNOSIS — M25641 Stiffness of right hand, not elsewhere classified: Secondary | ICD-10-CM

## 2024-10-07 DIAGNOSIS — R600 Localized edema: Secondary | ICD-10-CM

## 2024-10-07 DIAGNOSIS — M6281 Muscle weakness (generalized): Secondary | ICD-10-CM

## 2024-10-07 DIAGNOSIS — R278 Other lack of coordination: Secondary | ICD-10-CM

## 2024-10-07 MED ORDER — CYCLOBENZAPRINE HCL 10 MG PO TABS: 10.0000 mg | ORAL_TABLET | Freq: Three times a day (TID) | ORAL | 0 refills | Status: AC | PRN

## 2024-10-07 NOTE — Patient Instructions (Signed)
 Composite Extension (Passive Flexor Stretch)    Sitting with elbows on table and palms together, slowly lower wrists toward table until stretch is felt. Be sure to keep palms together throughout stretch. Hold _20___ seconds. Relax. Repeat __5__ times. Do __4__ sessions per day.  Weight Bearing (Standing)    Rest palms comfortably on table, gently lean to the side and forward over hand. Hold __10__ seconds. Repeat __5__ times. Do _4___ sessions per day.   Pronation / Supination (Resistive)    Hold hammer weighing __8-16__ ounces and rotate palm up and down. Keep elbow flexed at side and wrist straight. Repeat __10__ times. Do __4__ sessions per day.  1. Grip Strengthening (Resistive Putty)   Squeeze putty using thumb and all fingers. Repeat _20___ times. Do __2__ sessions per day.   2. Roll putty into tube on table and pinch between first two fingers and thumb x 10 reps. Do 2 sessions per day

## 2024-10-07 NOTE — Therapy (Signed)
 OUTPATIENT OCCUPATIONAL THERAPY ORTHO TREATMENT  Patient Name: Kelsey Walton MRN: [id number] DOB:1981-04-24, 43 y.o., female Today's Date: 10/07/2024  PCP: Adina Buel HERO, MD REFERRING PROVIDER: [name] Buster, MD   END OF SESSION:  OT End of Session - 10/07/24 1114     Visit Number 4    Number of Visits 16    Date for Recertification  11/09/24    Authorization Type [employer]    OT Start Time 1110   arrived late   OT Stop Time 1145    OT Time Calculation (min) 35 min    Activity Tolerance Patient tolerated treatment well    Behavior During Therapy WFL for tasks assessed/performed         Past Medical History:  Diagnosis Date   Anxiety    Arthritis    Depression    PTSD (post-traumatic stress disorder)    Seizures (HCC) 09/26/2019   teeth clencing/ anxiety are symtoms of seizures   Past Surgical History:  Procedure Laterality Date   ABDOMINAL HYSTERECTOMY N/A 06/30/2019   Procedure: HYSTERECTOMY ABDOMINAL;  Surgeon: Jayne Vonn DEL, MD;  Location: [facility];  Service: Gynecology;  Laterality: N/A;   ARTHROTOMY,JOINT STABILIZATIOIN,W/TRIANGULAR FIBROCARTILAGE COMPLEX REPAIR Right 08/27/2024   Procedure: RIGHT WRIST MANIPULATION UNDER ANESTHESIA, DISTAL RADIOULNAR JOINT STABILIZATIOIN WITH PINNING;  Surgeon: [name] Buster, MD;  Location: [facility];  Service: Orthopedics;  Laterality: Right;   AUGMENTATION MAMMAPLASTY Bilateral 2008   saline   BILATERAL SALPINGECTOMY Bilateral 06/30/2019   Procedure: OPEN BILATERAL SALPINGECTOMY;  Surgeon: Jayne Vonn DEL, MD;  Location: [facility];  Service: Gynecology;  Laterality: Bilateral;   DILATION AND CURETTAGE OF UTERUS     OPEN REDUCTION INTERNAL FIXATION (ORIF) DISTAL RADIAL FRACTURE Right 06/08/2024   Procedure: OPEN REDUCTION INTERNAL FIXATION (ORIF) DISTAL RADIUS FRACTURE;  Surgeon: [name] Buster, MD;  Location:  SURGERY [facility]ENTER;  Service: Orthopedics;  Laterality: Right;  RIGHT WRIST OPEN REDUCTION  INTERNAL FIXATION DISTAL RADIUS AND POSSIBLE ULNAR STYLOID   TUBAL LIGATION     Patient Active Problem List   Diagnosis Date Noted   DRUJ (distal radioulnar joint) instability, post-traumatic, right 08/27/2024   Wrist stiffness, right 08/27/2024   Fracture, Colles, right, closed 06/08/2024   S/P hysterectomy 06/30/2019   Breast mass, right 07/17/2015    ONSET DATE: 08/16/2024   REFERRING DIAG: Z98.890,Z87.81 (ICD-10-CM) - S/P ORIF (open reduction internal fixation) fracture   Needs OT 2 weeks s/p right wrist stabilization and joint exploration 08/27/24   PROCEDURE 08/27/24 Right wrist manipulation under anesthesia Distal radioulnar joint stabilization with pinning  THERAPY DIAG:  Pain in right wrist  Stiffness of right hand, not elsewhere classified  Stiffness of right wrist, not elsewhere classified  Other lack of coordination  Muscle weakness (generalized)  Localized edema  Rationale for Evaluation and Treatment: Rehabilitation  SUBJECTIVE:   SUBJECTIVE STATEMENT: I asked the doctor to give me muscle relaxer's even though I don't like taking them but if I don't have them my ring and small finger will shake.    Pt reports tender spot palmarly to radial side of distal wrist.   Pt accompanied by: self  PERTINENT HISTORY: Right distal radius fracture open reduction internal fixation 06/08/24. PMH: Anxiety, seizures, PTSD   PRECAUTIONS: no lifting > 10 lbs  RED FLAGS: Extreme pain/stiffness   WEIGHT BEARING RESTRICTIONS: none  PAIN:  Are you having pain? Yes: NPRS scale: 3-10/10 Pain location: RUE Pain description: sore Aggravating factors: certain movements Relieving factors: rest/supporting arm  FALLS: Has patient fallen in last 6 months? Yes. Number of falls 1  LIVING ENVIRONMENT: Lives with: daughters live with patient (45 and 71)  Lives in: House/apartment Stairs: 7 steps to enter, 1 level  Has following equipment at home: None  PLOF:  Independent  PATIENT GOALS: GET MY LIFE BACK   NEXT MD VISIT: 2 weeks  OBJECTIVE:  Note: Objective measures were completed at Evaluation unless otherwise noted.  HAND DOMINANCE: Right  ADLs: Overall ADLs: mod I w/ BADLS using Lt hand however requires assist prn for 2 handed tasks and some IADLS  FUNCTIONAL OUTCOME MEASURES: Quick Dash: 68.18% deficit (d/t RUE)   UPPER EXTREMITY ROM:   RUE: shoulder WFL's but difficulty and prone to stiffness, see below for elbow flex/ext, pt demo no active sup/pron today, unable to assess wrist d/t cast, finger and thumb ROM limited w/ thumb opposition barely to index finger only, MP flexion limited d/t cast, PIP flex digits 2-5 approx 75% and composite flexion less than 50%  Active ROM Right eval Left eval  Shoulder flexion WFL's   Elbow flexion 130*   Elbow extension -43*   Wrist pronation unable   Wrist supination unable   (Blank rows = not tested)   UPPER EXTREMITY MMT:   Not tested d/t pain and precautions at wrist/forearm  HAND FUNCTION: 09/23/2024: Grip strength: Right: 9.9 lbs; Left: 59 lbs 10/07/24: Rt = 19.4 lbs  COORDINATION: Unable to pick up anything small Rt hand d/t limited ROM  SENSATION: Not tested  EDEMA: Mild at fingers  COGNITION: Overall cognitive status: pt limited by pain and restless/anxious. Difficulty focusing on therapist when trying to review and update HEP  Areas of impairment: attention, behavior  OBSERVATIONS: Pt arrived in cast directly from MD office. Pin removed and casted prior to this O.T. evaluation  TREATMENT:   Pt arrived 10 min late  Pt issued updated HEP for P/ROM to wrist, light wt bearing over hands (and shown modifications at wall), and weighted stretches w/ hammer. Pt also shown ex's to do with putty (pt already has red resistance putty). See pt instructions for details. Pt demo each ex except putty as she did not bring putty with her  Pt also performed wrist extension x 10 reps with  1 lb dumb bell   Pt issued buddy strap for small finger and shown how to properly don as needed  Therapist performed PROM to wrist in extension and to small finger PIP joint w/ slight MP flexion to prevent compensations  PATIENT EDUCATION: Education details: see above Person educated: Patient Education method: Programmer, Multimedia, Demonstration, Verbal cues, and Handouts Education comprehension: verbalized understanding, returned demonstration, verbal cues required, and needs further education  HOME EXERCISE PROGRAM: 09/09/24: reprint of 07/29/24 HEP except last 3 exercises, verbally instructed to add PROM to fingers/thumb, and shoulder ROM ex's 09/23/2024: putty and hammer HEP 10/07/24: updated HEP   GOALS: Goals reviewed with patient? Yes  SHORT TERM GOALS: Target date: 10/10/24  Independent with appropriate HEP following recent sx Baseline: issued, will need updates/modifications when cast comes off Goal status: IN PROGRESS  2.  Pt to improve elbow extension to -30* or less RUE Baseline: -43* Goal status: IN PROGRESS  3.  Pt to have 75% or greater composite flexion Rt hand for gross grasp/release Baseline: less than 50% Goal status: INITIAL  4.  Pt to report pain less than or equal to 5/10 with HEP Baseline: 10/10 Goal status: INITIAL  5.  Pt to oppose thumb to  ring finger for functional thumb use/movement Baseline: max difficulty to index finger Goal status: INITIAL  6.  Pt to use RUE as min assist for bilateral tasks Baseline: dependent at this time Goal status: INITIAL  LONG TERM GOALS: Target date: 11/09/24  Independent with updated P/ROM and strengthening HEP  Baseline: not yet issued d/t current precautions/cast Goal status: INITIAL  2.  Pt to demo 30* or greater wrist flex/ext RUE for functional use Baseline: unable to assess d/t cast Goal status: INITIAL  3.  Pt to demo 45* forearm pronation and supination RUE Baseline: Neutral - unable at this time d/t  stiffness and pain Goal status: INITIAL  4.  RUE elbow flexion to 140* and extension to -25* or less Baseline: 130*, -43*  Goal status: INITIAL  5.  Pt to return to using Rt hand for eating, grooming, and writing 90% or more of the time Baseline: currently using Lt non dominant hand Goal status: INITIAL  6.  Quick Dash to improve to 45% or less deficit RUE Baseline: 68.18% Goal status: INITIAL  ASSESSMENT:  CLINICAL IMPRESSION: Patient reports benefits from cupping techniques used last session. Pt issued some updated stretches today - see pt instructions. Pt also with increased grip strength Rt hand compared to when last assessed.   PERFORMANCE DEFICITS: in functional skills including ADLs, IADLs, coordination, dexterity, proprioception, sensation, edema, ROM, strength, pain, Fine motor control, Gross motor control, body mechanics, decreased knowledge of precautions, decreased knowledge of use of DME, and UE functional use, cognitive skills including emotional, and psychosocial skills including coping strategies.   IMPAIRMENTS: are limiting patient from ADLs, IADLs, rest and sleep, work, and leisure.   COMORBIDITIES: may have co-morbidities  that affects occupational performance. Patient will benefit from skilled OT to address above impairments and improve overall function.  REHAB POTENTIAL: Fair limited by pain, significant stiffness, time since onset of initial injury  PLAN:  OT FREQUENCY: 2x/week  OT DURATION: 8 weeks  PLANNED INTERVENTIONS: 97535 self care/ADL training, 02889 therapeutic exercise, 97530 therapeutic activity, 97140 manual therapy, 97113 aquatic therapy, 97035 ultrasound, 97018 paraffin, 02960 fluidotherapy, 97010 moist heat, 97010 cryotherapy, 97034 contrast bath, 97032 electrical stimulation (manual), 97014 electrical stimulation unattended, 97760 Orthotic Initial, 97763 Orthotic/Prosthetic subsequent, scar mobilization, passive range of motion, compression  bandaging, coping strategies training, patient/family education, and DME and/or AE instructions  RECOMMENDED OTHER SERVICES: none at this time  CONSULTED AND AGREED WITH PLAN OF CARE: Patient  PLAN FOR NEXT SESSION:   ROM Tape, IASTM, or cupping as needed    For all possible CPT codes, reference the Planned Interventions line above.     Check all conditions that are expected to impact treatment: {Conditions expected to impact treatment:Neurological condition and/or seizures and Psychological or psychiatric disorders   If treatment provided at initial evaluation, no treatment charged due to lack of authorization.        Burnard JINNY Roads, OT 10/07/2024, 11:15 AM

## 2024-10-12 ENCOUNTER — Encounter: Payer: Self-pay | Admitting: Occupational Therapy

## 2024-10-12 ENCOUNTER — Ambulatory Visit: Admitting: Occupational Therapy

## 2024-10-12 DIAGNOSIS — R278 Other lack of coordination: Secondary | ICD-10-CM

## 2024-10-12 DIAGNOSIS — R600 Localized edema: Secondary | ICD-10-CM

## 2024-10-12 DIAGNOSIS — M25531 Pain in right wrist: Secondary | ICD-10-CM

## 2024-10-12 DIAGNOSIS — M25631 Stiffness of right wrist, not elsewhere classified: Secondary | ICD-10-CM

## 2024-10-12 DIAGNOSIS — M6281 Muscle weakness (generalized): Secondary | ICD-10-CM

## 2024-10-12 DIAGNOSIS — M79641 Pain in right hand: Secondary | ICD-10-CM

## 2024-10-12 DIAGNOSIS — M25641 Stiffness of right hand, not elsewhere classified: Secondary | ICD-10-CM

## 2024-10-12 NOTE — Therapy (Signed)
 OUTPATIENT OCCUPATIONAL THERAPY ORTHO TREATMENT  Patient Name: Kelsey Walton MRN: [id number] DOB:1981/01/18, 43 y.o., female Today's Date: 10/12/2024  PCP: Adina Buel HERO, MD REFERRING PROVIDER: [name] Buster, MD   END OF SESSION:  OT End of Session - 10/12/24 1113     Visit Number 5    Number of Visits 16    Date for Recertification  11/09/24    Authorization Type [employer]    Authorization Time Period approved 15 visits 10/16 - 11/24/24    OT Start Time 1110   pt arrived late   OT Stop Time 1145    OT Time Calculation (min) 35 min    Activity Tolerance Patient tolerated treatment well    Behavior During Therapy WFL for tasks assessed/performed         Past Medical History:  Diagnosis Date   Anxiety    Arthritis    Depression    PTSD (post-traumatic stress disorder)    Seizures (HCC) 09/26/2019   teeth clencing/ anxiety are symtoms of seizures   Past Surgical History:  Procedure Laterality Date   ABDOMINAL HYSTERECTOMY N/A 06/30/2019   Procedure: HYSTERECTOMY ABDOMINAL;  Surgeon: Jayne Vonn DEL, MD;  Location: [facility];  Service: Gynecology;  Laterality: N/A;   ARTHROTOMY,JOINT STABILIZATIOIN,W/TRIANGULAR FIBROCARTILAGE COMPLEX REPAIR Right 08/27/2024   Procedure: RIGHT WRIST MANIPULATION UNDER ANESTHESIA, DISTAL RADIOULNAR JOINT STABILIZATIOIN WITH PINNING;  Surgeon: [name] Buster, MD;  Location: [facility];  Service: Orthopedics;  Laterality: Right;   AUGMENTATION MAMMAPLASTY Bilateral 2008   saline   BILATERAL SALPINGECTOMY Bilateral 06/30/2019   Procedure: OPEN BILATERAL SALPINGECTOMY;  Surgeon: Jayne Vonn DEL, MD;  Location: [facility];  Service: Gynecology;  Laterality: Bilateral;   DILATION AND CURETTAGE OF UTERUS     OPEN REDUCTION INTERNAL FIXATION (ORIF) DISTAL RADIAL FRACTURE Right 06/08/2024   Procedure: OPEN REDUCTION INTERNAL FIXATION (ORIF) DISTAL RADIUS FRACTURE;  Surgeon: [name] Buster, MD;  Location: [facility];   Service: Orthopedics;  Laterality: Right;  RIGHT WRIST OPEN REDUCTION INTERNAL FIXATION DISTAL RADIUS AND POSSIBLE ULNAR STYLOID   TUBAL LIGATION     Patient Active Problem List   Diagnosis Date Noted   DRUJ (distal radioulnar joint) instability, post-traumatic, right 08/27/2024   Wrist stiffness, right 08/27/2024   Fracture, Colles, right, closed 06/08/2024   S/P hysterectomy 06/30/2019   Breast mass, right 07/17/2015    ONSET DATE: 08/16/2024   REFERRING DIAG: Z98.890,Z87.81 (ICD-10-CM) - S/P ORIF (open reduction internal fixation) fracture   Needs OT 2 weeks s/p right wrist stabilization and joint exploration 08/27/24   PROCEDURE 08/27/24 Right wrist manipulation under anesthesia Distal radioulnar joint stabilization with pinning  THERAPY DIAG:  Pain in right wrist  Stiffness of right hand, not elsewhere classified  Stiffness of right wrist, not elsewhere classified  Other lack of coordination  Muscle weakness (generalized)  Localized edema  Pain in right hand  Rationale for Evaluation and Treatment: Rehabilitation  SUBJECTIVE:   SUBJECTIVE STATEMENT: I have overdone it and I'm getting a tension headache and tightness in my upper traps.  The buddy strap you gave me seemed to help a little bit w/ my finger.    Pt accompanied by: self  PERTINENT HISTORY: Right distal radius fracture open reduction internal fixation 06/08/24. PMH: Anxiety, seizures, PTSD   PRECAUTIONS: no lifting > 10 lbs  RED FLAGS: Extreme pain/stiffness   WEIGHT BEARING RESTRICTIONS: none  PAIN:  Are you having pain? Yes: NPRS scale: 3-10/10 Pain location: RUE Pain description: sore  Aggravating factors: certain movements Relieving factors: rest/supporting arm  FALLS: Has patient fallen in last 6 months? Yes. Number of falls 1  LIVING ENVIRONMENT: Lives with: daughters live with patient (31 and 48)  Lives in: House/apartment Stairs: 7 steps to enter, 1 level  Has following  equipment at home: None  PLOF: Independent  PATIENT GOALS: GET MY LIFE BACK   NEXT MD VISIT: 2 weeks  OBJECTIVE:  Note: Objective measures were completed at Evaluation unless otherwise noted.  HAND DOMINANCE: Right  ADLs: Overall ADLs: mod I w/ BADLS using Lt hand however requires assist prn for 2 handed tasks and some IADLS  FUNCTIONAL OUTCOME MEASURES: Quick Dash: 68.18% deficit (d/t RUE)   UPPER EXTREMITY ROM:   RUE: shoulder WFL's but difficulty and prone to stiffness, see below for elbow flex/ext, pt demo no active sup/pron today, unable to assess wrist d/t cast, finger and thumb ROM limited w/ thumb opposition barely to index finger only, MP flexion limited d/t cast, PIP flex digits 2-5 approx 75% and composite flexion less than 50%  Active ROM Right eval Left eval  Shoulder flexion WFL's   Elbow flexion 130*   Elbow extension -43*   Wrist pronation unable   Wrist supination unable   (Blank rows = not tested)   UPPER EXTREMITY MMT:   Not tested d/t pain and precautions at wrist/forearm  HAND FUNCTION: 09/23/2024: Grip strength: Right: 9.9 lbs; Left: 59 lbs 10/07/24: Rt = 19.4 lbs  COORDINATION: Unable to pick up anything small Rt hand d/t limited ROM  SENSATION: Not tested  EDEMA: Mild at fingers  COGNITION: Overall cognitive status: pt limited by pain and restless/anxious. Difficulty focusing on therapist when trying to review and update HEP  Areas of impairment: attention, behavior  OBSERVATIONS: Pt arrived in cast directly from MD office. Pin removed and casted prior to this O.T. evaluation  TREATMENT:   Pt arrived late  Pt reports increased pain along upper traps Rt side due to compensations at shoulder from distal weakness. K-taped to relax upper trap and instructed pt on wear and care. Pt instructed to remove tape if she develops skin reaction or if it increases pain, otherwise may leave on for up to 5 days.   Pt given theraband (green resistance)  to work on scapula retraction through rowing ex and posterior sh strengthening with sh extension (BUE's simultaneously for both ex's)  Emphasized continuing with prayer stretch for wrist ext, and passive stretching and weighted stretch for FA sup/pronation  Standing: wt bearing over BUE's w/ A/P wt shifts and side to side wt shifts to gradually improve wrist extension  Flex bar ex's: Wrist flex and ext with tan (12.5 lbs) flex bar x 1 min for strength and endurance of affected extremity  Supination with tan (12.5 lbs) flex bar x 1 min for strength and endurance of affected extremity  Pronation with tan (12.5 lbs) flex bar x 1 min for strength and endurance of affected extremity   PATIENT EDUCATION: Education details: see above Person educated: Patient Education method: Programmer, Multimedia, Demonstration, Verbal cues, and Handouts Education comprehension: verbalized understanding, returned demonstration, verbal cues required, and needs further education  HOME EXERCISE PROGRAM: 09/09/24: reprint of 07/29/24 HEP except last 3 exercises, verbally instructed to add PROM to fingers/thumb, and shoulder ROM ex's 09/23/2024: putty and hammer HEP 10/07/24: updated HEP   GOALS: Goals reviewed with patient? Yes  SHORT TERM GOALS: Target date: 10/10/24  Independent with appropriate HEP following recent sx Baseline: issued, will need  updates/modifications when cast comes off Goal status: IN PROGRESS  2.  Pt to improve elbow extension to -30* or less RUE Baseline: -43* Goal status: IN PROGRESS  3.  Pt to have 75% or greater composite flexion Rt hand for gross grasp/release Baseline: less than 50% Goal status: INITIAL  4.  Pt to report pain less than or equal to 5/10 with HEP Baseline: 10/10 Goal status: INITIAL  5.  Pt to oppose thumb to ring finger for functional thumb use/movement Baseline: max difficulty to index finger Goal status: INITIAL  6.  Pt to use RUE as min assist for bilateral  tasks Baseline: dependent at this time Goal status: INITIAL  LONG TERM GOALS: Target date: 11/09/24  Independent with updated P/ROM and strengthening HEP  Baseline: not yet issued d/t current precautions/cast Goal status: INITIAL  2.  Pt to demo 30* or greater wrist flex/ext RUE for functional use Baseline: unable to assess d/t cast Goal status: INITIAL  3.  Pt to demo 45* forearm pronation and supination RUE Baseline: Neutral - unable at this time d/t stiffness and pain Goal status: INITIAL  4.  RUE elbow flexion to 140* and extension to -25* or less Baseline: 130*, -43*  Goal status: INITIAL  5.  Pt to return to using Rt hand for eating, grooming, and writing 90% or more of the time Baseline: currently using Lt non dominant hand Goal status: INITIAL  6.  Quick Dash to improve to 45% or less deficit RUE Baseline: 68.18% Goal status: INITIAL  ASSESSMENT:  CLINICAL IMPRESSION: Patient reports benefits from K-taping at end of session. Pt w/ less compensations for FA supination and gradual improvements in wrist extension.   PERFORMANCE DEFICITS: in functional skills including ADLs, IADLs, coordination, dexterity, proprioception, sensation, edema, ROM, strength, pain, Fine motor control, Gross motor control, body mechanics, decreased knowledge of precautions, decreased knowledge of use of DME, and UE functional use, cognitive skills including emotional, and psychosocial skills including coping strategies.   IMPAIRMENTS: are limiting patient from ADLs, IADLs, rest and sleep, work, and leisure.   COMORBIDITIES: may have co-morbidities  that affects occupational performance. Patient will benefit from skilled OT to address above impairments and improve overall function.  REHAB POTENTIAL: Fair limited by pain, significant stiffness, time since onset of initial injury  PLAN:  OT FREQUENCY: 2x/week  OT DURATION: 8 weeks  PLANNED INTERVENTIONS: 97535 self care/ADL training, 02889  therapeutic exercise, 97530 therapeutic activity, 97140 manual therapy, 97113 aquatic therapy, 97035 ultrasound, 97018 paraffin, 02960 fluidotherapy, 97010 moist heat, 97010 cryotherapy, 97034 contrast bath, 97032 electrical stimulation (manual), 97014 electrical stimulation unattended, 97760 Orthotic Initial, 97763 Orthotic/Prosthetic subsequent, scar mobilization, passive range of motion, compression bandaging, coping strategies training, patient/family education, and DME and/or AE instructions  RECOMMENDED OTHER SERVICES: none at this time  CONSULTED AND AGREED WITH PLAN OF CARE: Patient  PLAN FOR NEXT SESSION:  Assess how K-tape went?   ROM, strength Tape, IASTM, or cupping as needed UBE    For all possible CPT codes, reference the Planned Interventions line above.     Check all conditions that are expected to impact treatment: {Conditions expected to impact treatment:Neurological condition and/or seizures and Psychological or psychiatric disorders   If treatment provided at initial evaluation, no treatment charged due to lack of authorization.        Burnard JINNY Roads, OT 10/12/2024, 2:40 PM

## 2024-10-15 ENCOUNTER — Ambulatory Visit: Admitting: Occupational Therapy

## 2024-10-15 DIAGNOSIS — R278 Other lack of coordination: Secondary | ICD-10-CM

## 2024-10-15 DIAGNOSIS — M25631 Stiffness of right wrist, not elsewhere classified: Secondary | ICD-10-CM

## 2024-10-15 DIAGNOSIS — M25531 Pain in right wrist: Secondary | ICD-10-CM | POA: Diagnosis not present

## 2024-10-15 DIAGNOSIS — M6281 Muscle weakness (generalized): Secondary | ICD-10-CM

## 2024-10-15 DIAGNOSIS — R600 Localized edema: Secondary | ICD-10-CM

## 2024-10-15 DIAGNOSIS — M25641 Stiffness of right hand, not elsewhere classified: Secondary | ICD-10-CM

## 2024-10-15 DIAGNOSIS — R29818 Other symptoms and signs involving the nervous system: Secondary | ICD-10-CM

## 2024-10-15 DIAGNOSIS — M79641 Pain in right hand: Secondary | ICD-10-CM

## 2024-10-15 DIAGNOSIS — R208 Other disturbances of skin sensation: Secondary | ICD-10-CM

## 2024-10-15 DIAGNOSIS — R29898 Other symptoms and signs involving the musculoskeletal system: Secondary | ICD-10-CM

## 2024-10-15 NOTE — Patient Instructions (Signed)
 4 squares length taping from neck over upper trap to shoulder

## 2024-10-15 NOTE — Therapy (Signed)
 OUTPATIENT OCCUPATIONAL THERAPY ORTHO TREATMENT  Patient Name: Kelsey Walton MRN: [id number] DOB:1981-01-16, 43 y.o., female Today's Date: 10/15/2024  PCP: Adina Buel HERO, MD REFERRING PROVIDER: [name] Buster, MD   END OF SESSION:  OT End of Session - 10/15/24 1023     Visit Number 6    Number of Visits 16    Date for Recertification  11/09/24    Authorization Type [employer]    Authorization Time Period approved 15 visits 10/16 - 11/24/24    OT Start Time 1020    OT Stop Time 1100    OT Time Calculation (min) 40 min    Activity Tolerance Patient tolerated treatment well    Behavior During Therapy WFL for tasks assessed/performed         Past Medical History:  Diagnosis Date   Anxiety    Arthritis    Depression    PTSD (post-traumatic stress disorder)    Seizures (HCC) 09/26/2019   teeth clencing/ anxiety are symtoms of seizures   Past Surgical History:  Procedure Laterality Date   ABDOMINAL HYSTERECTOMY N/A 06/30/2019   Procedure: HYSTERECTOMY ABDOMINAL;  Surgeon: Jayne Vonn DEL, MD;  Location: [facility];  Service: Gynecology;  Laterality: N/A;   ARTHROTOMY,JOINT STABILIZATIOIN,W/TRIANGULAR FIBROCARTILAGE COMPLEX REPAIR Right 08/27/2024   Procedure: RIGHT WRIST MANIPULATION UNDER ANESTHESIA, DISTAL RADIOULNAR JOINT STABILIZATIOIN WITH PINNING;  Surgeon: [name] Buster, MD;  Location: [facility];  Service: Orthopedics;  Laterality: Right;   AUGMENTATION MAMMAPLASTY Bilateral 2008   saline   BILATERAL SALPINGECTOMY Bilateral 06/30/2019   Procedure: OPEN BILATERAL SALPINGECTOMY;  Surgeon: Jayne Vonn DEL, MD;  Location: [facility];  Service: Gynecology;  Laterality: Bilateral;   DILATION AND CURETTAGE OF UTERUS     OPEN REDUCTION INTERNAL FIXATION (ORIF) DISTAL RADIAL FRACTURE Right 06/08/2024   Procedure: OPEN REDUCTION INTERNAL FIXATION (ORIF) DISTAL RADIUS FRACTURE;  Surgeon: [name] Buster, MD;  Location: [facility];  Service:  Orthopedics;  Laterality: Right;  RIGHT WRIST OPEN REDUCTION INTERNAL FIXATION DISTAL RADIUS AND POSSIBLE ULNAR STYLOID   TUBAL LIGATION     Patient Active Problem List   Diagnosis Date Noted   DRUJ (distal radioulnar joint) instability, post-traumatic, right 08/27/2024   Wrist stiffness, right 08/27/2024   Fracture, Colles, right, closed 06/08/2024   S/P hysterectomy 06/30/2019   Breast mass, right 07/17/2015    ONSET DATE: 08/16/2024   REFERRING DIAG: Z98.890,Z87.81 (ICD-10-CM) - S/P ORIF (open reduction internal fixation) fracture   Needs OT 2 weeks s/p right wrist stabilization and joint exploration 08/27/24   PROCEDURE 08/27/24 Right wrist manipulation under anesthesia Distal radioulnar joint stabilization with pinning  THERAPY DIAG:  Pain in right wrist  Stiffness of right hand, not elsewhere classified  Stiffness of right wrist, not elsewhere classified  Other lack of coordination  Muscle weakness (generalized)  Localized edema  Pain in right hand  Other disturbances of skin sensation  Other symptoms and signs involving the musculoskeletal system  Other symptoms and signs involving the nervous system  Rationale for Evaluation and Treatment: Rehabilitation  SUBJECTIVE:   SUBJECTIVE STATEMENT: She might be able to have her daughter complete taping for her R shoulder.   Pt accompanied by: self  PERTINENT HISTORY: Right distal radius fracture open reduction internal fixation 06/08/24. PMH: Anxiety, seizures, PTSD   PRECAUTIONS: no lifting > 10 lbs  RED FLAGS: Extreme pain/stiffness   WEIGHT BEARING RESTRICTIONS: none  PAIN:  Are you having pain? Yes: NPRS scale: 3-10/10 Pain location: RUE Pain description:  sore Aggravating factors: certain movements Relieving factors: rest/supporting arm  FALLS: Has patient fallen in last 6 months? Yes. Number of falls 1  LIVING ENVIRONMENT: Lives with: daughters live with patient (67 and 58)  Lives in:  House/apartment Stairs: 7 steps to enter, 1 level  Has following equipment at home: None  PLOF: Independent  PATIENT GOALS: GET MY LIFE BACK   NEXT MD VISIT: 2 weeks  OBJECTIVE:  Note: Objective measures were completed at Evaluation unless otherwise noted.  HAND DOMINANCE: Right  ADLs: Overall ADLs: mod I w/ BADLS using Lt hand however requires assist prn for 2 handed tasks and some IADLS  FUNCTIONAL OUTCOME MEASURES: Quick Dash: 68.18% deficit (d/t RUE)   UPPER EXTREMITY ROM:   RUE: shoulder WFL's but difficulty and prone to stiffness, see below for elbow flex/ext, pt demo no active sup/pron today, unable to assess wrist d/t cast, finger and thumb ROM limited w/ thumb opposition barely to index finger only, MP flexion limited d/t cast, PIP flex digits 2-5 approx 75% and composite flexion less than 50%  Active ROM Right eval Left eval  Shoulder flexion WFL's   Elbow flexion 130*   Elbow extension -43*   Wrist pronation unable   Wrist supination unable   (Blank rows = not tested)   UPPER EXTREMITY MMT:   Not tested d/t pain and precautions at wrist/forearm  HAND FUNCTION: 09/23/2024: Grip strength: Right: 9.9 lbs; Left: 59 lbs 10/07/24: Rt = 19.4 lbs  COORDINATION: Unable to pick up anything small Rt hand d/t limited ROM  SENSATION: Not tested  EDEMA: Mild at fingers  COGNITION: Overall cognitive status: pt limited by pain and restless/anxious. Difficulty focusing on therapist when trying to review and update HEP  Areas of impairment: attention, behavior  OBSERVATIONS: Pt arrived in cast directly from MD office. Pin removed and casted prior to this O.T. evaluation  TREATMENT:  - Self-care/home management completed for duration as noted below including: Pt reports increased pain along upper traps Rt side due to compensations at shoulder from distal weakness. K-taped to relax upper trap and instructed pt on wear and care. Pt instructed to remove tape if she  develops skin reaction or if it increases pain, otherwise may leave on for up to 5 days. Additional instructions for application provided as noted in pt instructions.   - Therapeutic exercises completed for duration as noted below including:  Therapist performed PROM to R wrist in extension and flexion, digit flexion, and forearm supination to prevent compensations.   - Ultrasound completed for duration as noted below including:  Ultrasound applied to palmar and dorsal right hand, digits, wrist, and forearm for 8 minutes, frequency of 3 MHz, 20% duty cycle, and 1.1 W/cm with pt's arm placed on soft towel for promotion of ROM, edema reduction, and pain reduction in affected extremity.  PATIENT EDUCATION: Education details: see above Person educated: Patient Education method: Solicitor, Verbal cues, and Handouts Education comprehension: verbalized understanding, returned demonstration, verbal cues required, and needs further education  HOME EXERCISE PROGRAM: 09/09/24: reprint of 07/29/24 HEP except last 3 exercises, verbally instructed to add PROM to fingers/thumb, and shoulder ROM ex's 09/23/2024: putty and hammer HEP 10/07/24: updated HEP  10/15/2024: Ktape instructions  GOALS: Goals reviewed with patient? Yes  SHORT TERM GOALS: Target date: 10/10/24  Independent with appropriate HEP following recent sx Baseline: issued, will need updates/modifications when cast comes off Goal status: IN PROGRESS  2.  Pt to improve elbow extension to -30* or less  RUE Baseline: -43* Goal status: IN PROGRESS  3.  Pt to have 75% or greater composite flexion Rt hand for gross grasp/release Baseline: less than 50% Goal status: IN PROGRESS  4.  Pt to report pain less than or equal to 5/10 with HEP Baseline: 10/10 Goal status: IN PROGRESS  5.  Pt to oppose thumb to ring finger for functional thumb use/movement Baseline: max difficulty to index finger Goal status: IN  PROGRESS  6.  Pt to use RUE as min assist for bilateral tasks Baseline: dependent at this time Goal status: INITIAL  LONG TERM GOALS: Target date: 11/09/24  Independent with updated P/ROM and strengthening HEP  Baseline: not yet issued d/t current precautions/cast Goal status: INITIAL  2.  Pt to demo 30* or greater wrist flex/ext RUE for functional use Baseline: unable to assess d/t cast Goal status: IN PROGRESS  3.  Pt to demo 45* forearm pronation and supination RUE Baseline: Neutral - unable at this time d/t stiffness and pain Goal status: IN PROGRESS  4.  RUE elbow flexion to 140* and extension to -25* or less Baseline: 130*, -43*  Goal status: IN PROGRESS  5.  Pt to return to using Rt hand for eating, grooming, and writing 90% or more of the time Baseline: currently using Lt non dominant hand Goal status: IN PROGRESS  6.  Quick Dash to improve to 45% or less deficit RUE Baseline: 68.18% Goal status: IN PROGRESS  ASSESSMENT:  CLINICAL IMPRESSION: Pt verbalizes good understanding of taping for pain management of RUE and as needed for home carryover. Will further ROM and strengthening efforts as needed to promote improved functional use.   PERFORMANCE DEFICITS: in functional skills including ADLs, IADLs, coordination, dexterity, proprioception, sensation, edema, ROM, strength, pain, Fine motor control, Gross motor control, body mechanics, decreased knowledge of precautions, decreased knowledge of use of DME, and UE functional use, cognitive skills including emotional, and psychosocial skills including coping strategies.   IMPAIRMENTS: are limiting patient from ADLs, IADLs, rest and sleep, work, and leisure.   COMORBIDITIES: may have co-morbidities  that affects occupational performance. Patient will benefit from skilled OT to address above impairments and improve overall function.  REHAB POTENTIAL: Fair limited by pain, significant stiffness, time since onset of initial  injury  PLAN:  OT FREQUENCY: 2x/week  OT DURATION: 8 weeks  PLANNED INTERVENTIONS: 97535 self care/ADL training, 02889 therapeutic exercise, 97530 therapeutic activity, 97140 manual therapy, 97113 aquatic therapy, 97035 ultrasound, 97018 paraffin, 02960 fluidotherapy, 97010 moist heat, 97010 cryotherapy, 97034 contrast bath, 97032 electrical stimulation (manual), 97014 electrical stimulation unattended, 97760 Orthotic Initial, 97763 Orthotic/Prosthetic subsequent, scar mobilization, passive range of motion, compression bandaging, coping strategies training, patient/family education, and DME and/or AE instructions  RECOMMENDED OTHER SERVICES: none at this time  CONSULTED AND AGREED WITH PLAN OF CARE: Patient  PLAN FOR NEXT SESSION: Theraband strengthening   ROM, strength Tape, IASTM, or cupping as needed UBE   For all possible CPT codes, reference the Planned Interventions line above.     Check all conditions that are expected to impact treatment: {Conditions expected to impact treatment:Neurological condition and/or seizures and Psychological or psychiatric disorders   If treatment provided at initial evaluation, no treatment charged due to lack of authorization.        Jocelyn CHRISTELLA Bottom, OT 10/15/2024, 10:26 AM

## 2024-10-19 ENCOUNTER — Ambulatory Visit: Admitting: Occupational Therapy

## 2024-10-19 DIAGNOSIS — R208 Other disturbances of skin sensation: Secondary | ICD-10-CM

## 2024-10-19 DIAGNOSIS — R600 Localized edema: Secondary | ICD-10-CM

## 2024-10-19 DIAGNOSIS — M79641 Pain in right hand: Secondary | ICD-10-CM

## 2024-10-19 DIAGNOSIS — M25641 Stiffness of right hand, not elsewhere classified: Secondary | ICD-10-CM

## 2024-10-19 DIAGNOSIS — M6281 Muscle weakness (generalized): Secondary | ICD-10-CM

## 2024-10-19 DIAGNOSIS — M25631 Stiffness of right wrist, not elsewhere classified: Secondary | ICD-10-CM

## 2024-10-19 DIAGNOSIS — R278 Other lack of coordination: Secondary | ICD-10-CM

## 2024-10-19 DIAGNOSIS — M25531 Pain in right wrist: Secondary | ICD-10-CM | POA: Diagnosis not present

## 2024-10-19 NOTE — Patient Instructions (Signed)
 Scapular Retraction: Bilateral    Facing anchor, pull arms back, bringing shoulder blades together. Do not hike shoulders up.  Repeat __10__ times per set. Do __2__ sessions per day.    Strengthening: Resisted Extension   Hold tubing in __both___ hand(s), arm forward. Pull arm back, elbow straight. Repeat _10___ times per set. Do _1-2___ sessions per day, every other day.

## 2024-10-19 NOTE — Therapy (Signed)
 OUTPATIENT OCCUPATIONAL THERAPY ORTHO TREATMENT  Patient Name: Kelsey Walton MRN: [id number] DOB:May 17, 1981, 43 y.o., female Today's Date: 10/19/2024  PCP: Adina Buel HERO, MD REFERRING PROVIDER: [name] Buster, MD   END OF SESSION:  OT End of Session - 10/19/24 1115     Visit Number 7    Number of Visits 16    Date for Recertification  11/09/24    Authorization Type [employer]    Authorization Time Period approved 15 visits 10/16 - 11/24/24    OT Start Time 1110    OT Stop Time 1145    OT Time Calculation (min) 35 min    Activity Tolerance Patient tolerated treatment well    Behavior During Therapy WFL for tasks assessed/performed         Past Medical History:  Diagnosis Date   Anxiety    Arthritis    Depression    PTSD (post-traumatic stress disorder)    Seizures (HCC) 09/26/2019   teeth clencing/ anxiety are symtoms of seizures   Past Surgical History:  Procedure Laterality Date   ABDOMINAL HYSTERECTOMY N/A 06/30/2019   Procedure: HYSTERECTOMY ABDOMINAL;  Surgeon: Jayne Vonn DEL, MD;  Location: [facility];  Service: Gynecology;  Laterality: N/A;   ARTHROTOMY,JOINT STABILIZATIOIN,W/TRIANGULAR FIBROCARTILAGE COMPLEX REPAIR Right 08/27/2024   Procedure: RIGHT WRIST MANIPULATION UNDER ANESTHESIA, DISTAL RADIOULNAR JOINT STABILIZATIOIN WITH PINNING;  Surgeon: [name] Buster, MD;  Location: [facility];  Service: Orthopedics;  Laterality: Right;   AUGMENTATION MAMMAPLASTY Bilateral 2008   saline   BILATERAL SALPINGECTOMY Bilateral 06/30/2019   Procedure: OPEN BILATERAL SALPINGECTOMY;  Surgeon: Jayne Vonn DEL, MD;  Location: [facility];  Service: Gynecology;  Laterality: Bilateral;   DILATION AND CURETTAGE OF UTERUS     OPEN REDUCTION INTERNAL FIXATION (ORIF) DISTAL RADIAL FRACTURE Right 06/08/2024   Procedure: OPEN REDUCTION INTERNAL FIXATION (ORIF) DISTAL RADIUS FRACTURE;  Surgeon: [name] Buster, MD;  Location: [facility];  Service:  Orthopedics;  Laterality: Right;  RIGHT WRIST OPEN REDUCTION INTERNAL FIXATION DISTAL RADIUS AND POSSIBLE ULNAR STYLOID   TUBAL LIGATION     Patient Active Problem List   Diagnosis Date Noted   DRUJ (distal radioulnar joint) instability, post-traumatic, right 08/27/2024   Wrist stiffness, right 08/27/2024   Fracture, Colles, right, closed 06/08/2024   S/P hysterectomy 06/30/2019   Breast mass, right 07/17/2015    ONSET DATE: 08/16/2024   REFERRING DIAG: Z98.890,Z87.81 (ICD-10-CM) - S/P ORIF (open reduction internal fixation) fracture   Needs OT 2 weeks s/p right wrist stabilization and joint exploration 08/27/24   PROCEDURE 08/27/24 Right wrist manipulation under anesthesia Distal radioulnar joint stabilization with pinning  THERAPY DIAG:  Pain in right wrist  Stiffness of right hand, not elsewhere classified  Stiffness of right wrist, not elsewhere classified  Other lack of coordination  Muscle weakness (generalized)  Localized edema  Pain in right hand  Other disturbances of skin sensation  Rationale for Evaluation and Treatment: Rehabilitation  SUBJECTIVE:   SUBJECTIVE STATEMENT: I can't use that tape anymore. I'm allergic to it  Pt accompanied by: self  PERTINENT HISTORY: Right distal radius fracture open reduction internal fixation 06/08/24. PMH: Anxiety, seizures, PTSD   PRECAUTIONS: no lifting > 10 lbs  RED FLAGS: Extreme pain/stiffness   WEIGHT BEARING RESTRICTIONS: none  PAIN:  Are you having pain? Yes: NPRS scale: 3-10/10 Pain location: RUE Pain description: sore Aggravating factors: certain movements Relieving factors: rest/supporting arm  FALLS: Has patient fallen in last 6 months? Yes. Number of falls 1  LIVING ENVIRONMENT: Lives with: daughters live with patient (24 and 21)  Lives in: House/apartment Stairs: 7 steps to enter, 1 level  Has following equipment at home: None  PLOF: Independent  PATIENT GOALS: GET MY LIFE BACK   NEXT  MD VISIT: 2 weeks  OBJECTIVE:  Note: Objective measures were completed at Evaluation unless otherwise noted.  HAND DOMINANCE: Right  ADLs: Overall ADLs: mod I w/ BADLS using Lt hand however requires assist prn for 2 handed tasks and some IADLS  FUNCTIONAL OUTCOME MEASURES: Quick Dash: 68.18% deficit (d/t RUE)   UPPER EXTREMITY ROM:   RUE: shoulder WFL's but difficulty and prone to stiffness, see below for elbow flex/ext, pt demo no active sup/pron today, unable to assess wrist d/t cast, finger and thumb ROM limited w/ thumb opposition barely to index finger only, MP flexion limited d/t cast, PIP flex digits 2-5 approx 75% and composite flexion less than 50%  Active ROM Right eval Left eval  Shoulder flexion WFL's   Elbow flexion 130*   Elbow extension -43*   Wrist pronation unable   Wrist supination unable   (Blank rows = not tested)   UPPER EXTREMITY MMT:   Not tested d/t pain and precautions at wrist/forearm  HAND FUNCTION: 09/23/2024: Grip strength: Right: 9.9 lbs; Left: 59 lbs 10/07/24: Rt = 19.4 lbs  COORDINATION: Unable to pick up anything small Rt hand d/t limited ROM  SENSATION: Not tested  EDEMA: Mild at fingers  COGNITION: Overall cognitive status: pt limited by pain and restless/anxious. Difficulty focusing on therapist when trying to review and update HEP  Areas of impairment: attention, behavior  OBSERVATIONS: Pt arrived in cast directly from MD office. Pin removed and casted prior to this O.T. evaluation  TREATMENT:    Pt reports allergy to tape and she does have redness and bumps in neck area. Did recommend that tape be left off for a full 24 hrs before re-applying as this may have caused outbreak, and option of using liquid Maalox as skin barrier prior to K-tape application, however pt reports she can't use until after deer season b/c the odor may deter the deer.  Ultrasound x 8 minutes, 3 Mhz, 0.8 wts/cm2, 20% over volar wrist area to reduce tightness  and allow more finger ROM. Pt reports she is able to move fingers down more into flexion and frees up FA motion better following US .  P/ROM in forearm supination and pronation with elbow stabilized, followed by wrist P/ROM in flex, ext, and circumduction movements. Printed 2 theraband ex's (rows and shoulder extension) that she has done in previous O.T. session  PATIENT EDUCATION: Education details: see above Person educated: Patient Education method: Programmer, Multimedia, Demonstration, Verbal cues, and Handouts Education comprehension: verbalized understanding, returned demonstration, verbal cues required, and needs further education  HOME EXERCISE PROGRAM: 09/09/24: reprint of 07/29/24 HEP except last 3 exercises, verbally instructed to add PROM to fingers/thumb, and shoulder ROM ex's 09/23/2024: putty and hammer HEP 10/07/24: updated HEP  10/15/2024: Ktape instructions  GOALS: Goals reviewed with patient? Yes  SHORT TERM GOALS: Target date: 10/10/24  Independent with appropriate HEP following recent sx Baseline: issued, will need updates/modifications when cast comes off Goal status: IN PROGRESS  2.  Pt to improve elbow extension to -30* or less RUE Baseline: -43* Goal status: IN PROGRESS  3.  Pt to have 75% or greater composite flexion Rt hand for gross grasp/release Baseline: less than 50% Goal status: IN PROGRESS  4.  Pt to report pain less  than or equal to 5/10 with HEP Baseline: 10/10 Goal status: IN PROGRESS  5.  Pt to oppose thumb to ring finger for functional thumb use/movement Baseline: max difficulty to index finger Goal status: IN PROGRESS  6.  Pt to use RUE as min assist for bilateral tasks Baseline: dependent at this time Goal status: INITIAL  LONG TERM GOALS: Target date: 11/09/24  Independent with updated P/ROM and strengthening HEP  Baseline: not yet issued d/t current precautions/cast Goal status: INITIAL  2.  Pt to demo 30* or greater wrist flex/ext RUE  for functional use Baseline: unable to assess d/t cast Goal status: IN PROGRESS  3.  Pt to demo 45* forearm pronation and supination RUE Baseline: Neutral - unable at this time d/t stiffness and pain Goal status: IN PROGRESS  4.  RUE elbow flexion to 140* and extension to -25* or less Baseline: 130*, -43*  Goal status: IN PROGRESS  5.  Pt to return to using Rt hand for eating, grooming, and writing 90% or more of the time Baseline: currently using Lt non dominant hand Goal status: IN PROGRESS  6.  Quick Dash to improve to 45% or less deficit RUE Baseline: 68.18% Goal status: IN PROGRESS  ASSESSMENT:  CLINICAL IMPRESSION: Pt responds well to cupping techniques and ultrasound prior to ROM ex's. Pt had allergic reaction to K-tape and therefore did not reapply today  PERFORMANCE DEFICITS: in functional skills including ADLs, IADLs, coordination, dexterity, proprioception, sensation, edema, ROM, strength, pain, Fine motor control, Gross motor control, body mechanics, decreased knowledge of precautions, decreased knowledge of use of DME, and UE functional use, cognitive skills including emotional, and psychosocial skills including coping strategies.   IMPAIRMENTS: are limiting patient from ADLs, IADLs, rest and sleep, work, and leisure.   COMORBIDITIES: may have co-morbidities  that affects occupational performance. Patient will benefit from skilled OT to address above impairments and improve overall function.  REHAB POTENTIAL: Fair limited by pain, significant stiffness, time since onset of initial injury  PLAN:  OT FREQUENCY: 2x/week  OT DURATION: 8 weeks  PLANNED INTERVENTIONS: 97535 self care/ADL training, 02889 therapeutic exercise, 97530 therapeutic activity, 97140 manual therapy, 97113 aquatic therapy, 97035 ultrasound, 97018 paraffin, 02960 fluidotherapy, 97010 moist heat, 97010 cryotherapy, 97034 contrast bath, 97032 electrical stimulation (manual), 97014 electrical  stimulation unattended, 97760 Orthotic Initial, 97763 Orthotic/Prosthetic subsequent, scar mobilization, passive range of motion, compression bandaging, coping strategies training, patient/family education, and DME and/or AE instructions  RECOMMENDED OTHER SERVICES: none at this time  CONSULTED AND AGREED WITH PLAN OF CARE: Patient  PLAN FOR NEXT SESSION: assess STG's, continue modalities prn, flex bar  ROM, strength Tape, IASTM, or cupping as needed UBE   For all possible CPT codes, reference the Planned Interventions line above.     Check all conditions that are expected to impact treatment: {Conditions expected to impact treatment:Neurological condition and/or seizures and Psychological or psychiatric disorders   If treatment provided at initial evaluation, no treatment charged due to lack of authorization.        Burnard JINNY Roads, OT 10/19/2024, 11:16 AM

## 2024-10-24 ENCOUNTER — Other Ambulatory Visit: Payer: Self-pay | Admitting: Orthopedic Surgery

## 2024-10-26 ENCOUNTER — Ambulatory Visit: Attending: Family Medicine

## 2024-10-28 ENCOUNTER — Ambulatory Visit: Admitting: Occupational Therapy

## 2024-11-01 ENCOUNTER — Inpatient Hospital Stay: Admission: RE | Admit: 2024-11-01 | Discharge: 2024-11-01 | Payer: Self-pay | Attending: Family Medicine

## 2024-11-01 VITALS — BP 123/84 | HR 111 | Temp 98.3°F | Resp 18

## 2024-11-01 DIAGNOSIS — J069 Acute upper respiratory infection, unspecified: Secondary | ICD-10-CM | POA: Diagnosis present

## 2024-11-01 DIAGNOSIS — J4521 Mild intermittent asthma with (acute) exacerbation: Secondary | ICD-10-CM | POA: Diagnosis present

## 2024-11-01 DIAGNOSIS — N760 Acute vaginitis: Secondary | ICD-10-CM

## 2024-11-01 LAB — POCT URINE DIPSTICK
Bilirubin, UA: NEGATIVE
Glucose, UA: NEGATIVE mg/dL
Ketones, POC UA: NEGATIVE mg/dL
Leukocytes, UA: NEGATIVE
Nitrite, UA: NEGATIVE
POC PROTEIN,UA: NEGATIVE
Spec Grav, UA: <=1.005 — AB (ref 1.010–1.025)
Urobilinogen, UA: 0.2 U/dL
pH, UA: 6 (ref 5.0–8.0)

## 2024-11-01 MED ORDER — AMOXICILLIN-POT CLAVULANATE 875-125 MG PO TABS: 1.0000 | ORAL_TABLET | Freq: Two times a day (BID) | ORAL | 0 refills | Status: AC

## 2024-11-01 MED ORDER — DEXAMETHASONE SOD PHOSPHATE PF 10 MG/ML IJ SOLN
10.0000 mg | Freq: Once | INTRAMUSCULAR | Status: AC
  Administered 2024-11-01: 10 mg via INTRAMUSCULAR

## 2024-11-01 MED ORDER — ALBUTEROL SULFATE HFA 108 (90 BASE) MCG/ACT IN AERS: 2.0000 | INHALATION_SPRAY | RESPIRATORY_TRACT | 0 refills | Status: AC | PRN

## 2024-11-01 MED ORDER — FLUCONAZOLE 150 MG PO TABS: 150.0000 mg | ORAL_TABLET | Freq: Once | ORAL | 0 refills | Status: AC

## 2024-11-01 NOTE — ED Provider Notes (Signed)
 [facility]    CSN: [id number] Arrival date & time: 11/01/24  1138      History   Chief Complaint Chief Complaint  Patient presents with   Cough    Felt bad for about a week run fever on and off throat swollen went down came back discomfort when I pee - Entered by patient    HPI Kelsey Walton is a 43 y.o. female.   Patient presenting today with multiple complaints.  She states for over a week now she has had worsening productive cough, congestion, facial pain and pressure, sore throat, hoarseness, fatigue.  Denies fever, chills, chest pain, shortness of breath, abdominal pain, vomiting, diarrhea.  Has run out of her albuterol  inhaler and requesting a refill.  Has been taking over-the-counter cold and congestion medications, Mucinex and states her friend who is an ER nurse gave her some outdated Bactrim that seems to be helping.  Also having vaginal irritation, urinary hesitancy, strong urine odor for the past few days.  Tends to get recurrent BV infections so notes that she has prescriptions on file for metronidazole  gel which she has tried 2 rounds of and she has also tried Monistat with no relief.  Denies any rashes, lesions, pelvic or abdominal pain associated.  No concern for STIs or pregnancy, status post hysterectomy.    Past Medical History:  Diagnosis Date   Anxiety    Arthritis    Depression    PTSD (post-traumatic stress disorder)    Seizures (HCC) 09/26/2019   teeth clencing/ anxiety are symtoms of seizures    Patient Active Problem List   Diagnosis Date Noted   DRUJ (distal radioulnar joint) instability, post-traumatic, right 08/27/2024   Wrist stiffness, right 08/27/2024   Fracture, Colles, right, closed 06/08/2024   S/P hysterectomy 06/30/2019   Breast mass, right 07/17/2015    Past Surgical History:  Procedure Laterality Date   ABDOMINAL HYSTERECTOMY N/A 06/30/2019   Procedure: HYSTERECTOMY ABDOMINAL;  Surgeon: Jayne Vonn DEL, MD;  Location: [facility];  Service: Gynecology;  Laterality: N/A;   ARTHROTOMY,JOINT STABILIZATIOIN,W/TRIANGULAR FIBROCARTILAGE COMPLEX REPAIR Right 08/27/2024   Procedure: RIGHT WRIST MANIPULATION UNDER ANESTHESIA, DISTAL RADIOULNAR JOINT STABILIZATIOIN WITH PINNING;  Surgeon: [name] Buster, MD;  Location: [facility];  Service: Orthopedics;  Laterality: Right;   AUGMENTATION MAMMAPLASTY Bilateral 2008   saline   BILATERAL SALPINGECTOMY Bilateral 06/30/2019   Procedure: OPEN BILATERAL SALPINGECTOMY;  Surgeon: Jayne Vonn DEL, MD;  Location: [facility];  Service: Gynecology;  Laterality: Bilateral;   DILATION AND CURETTAGE OF UTERUS     OPEN REDUCTION INTERNAL FIXATION (ORIF) DISTAL RADIAL FRACTURE Right 06/08/2024   Procedure: OPEN REDUCTION INTERNAL FIXATION (ORIF) DISTAL RADIUS FRACTURE;  Surgeon: [name] Buster, MD;  Location: [facility];  Service: Orthopedics;  Laterality: Right;  RIGHT WRIST OPEN REDUCTION INTERNAL FIXATION DISTAL RADIUS AND POSSIBLE ULNAR STYLOID   TUBAL LIGATION      OB History     Gravida  3   Para  2   Term      Preterm      AB  1   Living  2      SAB      IAB      Ectopic      Multiple      Live Births           Obstetric Comments  Menstrual age:   Age 1st Pregnancy: 30  Home Medications    Prior to Admission medications   Medication Sig Start Date End Date Taking? Authorizing Provider  albuterol  (VENTOLIN  HFA) 108 (90 Base) MCG/ACT inhaler Inhale 2 puffs into the lungs every 4 (four) hours as needed. 11/01/24  Yes Stuart Vernell Norris, PA-C  amoxicillin -clavulanate (AUGMENTIN ) 875-125 MG tablet Take 1 tablet by mouth every 12 (twelve) hours. 11/01/24  Yes Stuart Vernell Norris, PA-C  fluconazole  (DIFLUCAN ) 150 MG tablet Take 1 tablet (150 mg total) by mouth once for 1 dose. 11/01/24 11/01/24 Yes Stuart Vernell Norris, PA-C  acetaminophen  (TYLENOL ) 500 MG tablet Take 500 mg by mouth every 6 (six) hours as needed.     [provider]  albuterol  (VENTOLIN  HFA) 108 (90 Base) MCG/ACT inhaler Inhale 2 puffs into the lungs every 6 (six) hours as needed. 05/06/24   Leath-Warren, Etta PARAS, NP  ALPRAZolam  (XANAX  XR) 3 MG 24 hr tablet Take 3 mg by mouth daily.    [provider]  ALPRAZolam  (XANAX ) 1 MG tablet Take 1 mg by mouth See admin instructions. Take between 3-6am 02/08/22   [provider]  Aspirin-Salicylamide-Caffeine (BC HEADACHE POWDER PO) Take 1 packet by mouth as needed (pain).    [provider]  cyclobenzaprine  (FLEXERIL ) 10 MG tablet Take 1 tablet (10 mg total) by mouth 3 (three) times daily as needed for muscle spasms. 10/07/24   Agarwala, Anshul, MD  fluconazole  (DIFLUCAN ) 150 MG tablet Take 1 tablet (150 mg total) by mouth daily. 07/07/24   Agarwala, Gildardo, MD  fluticasone  (FLONASE ) 50 MCG/ACT nasal spray Place 2 sprays into both nostrils daily. 05/06/24   Leath-Warren, Etta PARAS, NP  ibuprofen  (ADVIL ) 200 MG tablet Take 800 mg by mouth every 6 (six) hours as needed for headache.    [provider]  naloxone  (NARCAN ) nasal spray 4 mg/0.1 mL One spray PRN in case of overdose 05/30/24   Suellen Cantor A, PA-C  ondansetron  (ZOFRAN -ODT) 4 MG disintegrating tablet Take 1 tablet (4 mg total) by mouth every 6 (six) hours as needed for nausea or vomiting. 06/18/24   Agarwala, Anshul, MD  oxyCODONE  (ROXICODONE ) 5 MG immediate release tablet Take 1 tablet (5 mg total) by mouth every 6 (six) hours as needed. 09/09/24 09/09/25  [name] Gildardo, MD    Family History Family History  Problem Relation Age of Onset   Breast cancer Maternal Aunt        great aunt   Leukemia Maternal Grandfather    Prostate cancer Maternal Grandfather    Multiple myeloma Father        lung, neck, bone    Social History Social History   Tobacco Use   Smoking status: Former    Current packs/day: 0.00    Types: Cigarettes    Quit date: 12/15/2012    Years since quitting: 11.8    Smokeless tobacco: Never  Vaping Use   Vaping status: Never Used  Substance Use Topics   Alcohol use: Yes    Comment: 2-3 pints weekly, last drink 92 days ago   Drug use: Yes    Types: Marijuana    Comment: last time one month ago     Allergies   Tape and Gabapentin    Review of Systems Review of Systems PER HPI  Physical Exam Triage Vital Signs ED Triage Vitals  Encounter Vitals Group     BP 11/01/24 1213 123/84     Girls Systolic BP Percentile --      Girls Diastolic BP Percentile --  Boys Systolic BP Percentile --      Boys Diastolic BP Percentile --      Pulse Rate 11/01/24 1213 (!) 111     Resp 11/01/24 1213 18     Temp 11/01/24 1213 98.3 F (36.8 C)     Temp src --      SpO2 11/01/24 1213 96 %     Weight --      Height --      Head Circumference --      Peak Flow --      Pain Score 11/01/24 1219 0     Pain Loc --      Pain Education --      Exclude from Growth Chart --    No data found.  Updated Vital Signs BP 123/84 (BP Location: Right Arm)   Pulse (!) 111   Temp 98.3 F (36.8 C)   Resp 18   LMP 05/28/2019 (Within Days)   SpO2 96%   Visual Acuity Right Eye Distance:   Left Eye Distance:   Bilateral Distance:    Right Eye Near:   Left Eye Near:    Bilateral Near:     Physical Exam Vitals and nursing note reviewed.  Constitutional:      Appearance: Normal appearance.  HENT:     Head: Atraumatic.     Right Ear: Tympanic membrane and external ear normal.     Left Ear: Tympanic membrane and external ear normal.     Nose: Congestion present.     Mouth/Throat:     Mouth: Mucous membranes are moist.     Pharynx: Posterior oropharyngeal erythema present.  Eyes:     Extraocular Movements: Extraocular movements intact.     Conjunctiva/sclera: Conjunctivae normal.  Cardiovascular:     Rate and Rhythm: Normal rate and regular rhythm.     Heart sounds: Normal heart sounds.  Pulmonary:     Effort: Pulmonary effort is normal.     Breath  sounds: Normal breath sounds. No wheezing or rales.  Genitourinary:    Comments: GU exam deferred, self swab performed Musculoskeletal:        General: Normal range of motion.     Cervical back: Normal range of motion and neck supple.  Skin:    General: Skin is warm and dry.  Neurological:     Mental Status: She is alert and oriented to person, place, and time.  Psychiatric:        Mood and Affect: Mood normal.        Thought Content: Thought content normal.      UC Treatments / Results  Labs (all labs ordered are listed, but only abnormal results are displayed) Labs Reviewed  POCT URINE DIPSTICK - Abnormal; Notable for the following components:      Result Value   Spec Grav, UA <=1.005 (*)    Blood, UA trace-intact (*)    All other components within normal limits  CERVICOVAGINAL ANCILLARY ONLY    EKG   Radiology No results found.  Procedures Procedures (including critical care time)  Medications Ordered in UC Medications  dexamethasone  (DECADRON ) injection 10 mg (10 mg Intramuscular Given 11/01/24 1318)    Initial Impression / Assessment and Plan / UC Course  I have reviewed the triage vital signs and the nursing notes.  Pertinent labs & imaging results that were available during my care of the patient were reviewed by me and considered in my medical decision making (see chart for details).  Given duration and worsening course of upper respiratory symptoms, will start Augmentin , refill albuterol  and give IM Decadron  here today.  Patient requesting Diflucan  with the antibiotic as she tends to get a yeast infection.  Urinalysis today without evidence of urinary tract infection, she does note that she has not been taking her hormone suppositories given by OB/GYN so did recommend restarting these in case this is responsible for her urinary symptoms.  Vaginal swab pending for further evaluation additionally.  Return for worsening or unresolving symptoms.  Final  Clinical Impressions(s) / UC Diagnoses   Final diagnoses:  Acute upper respiratory infection  Acute vaginitis  Mild intermittent reactive airway disease with acute exacerbation     Discharge Instructions      Your urine test did not show evidence of a urinary tract infection.  We have sent out a vaginal swab for further evaluation.  We have given you a steroid shot today and I have prescribed an antibiotic, an albuterol  inhaler refill and a Diflucan .  Continue over-the-counter cold and congestion medications, Astelin nasal spray and other remedies as needed.  Return for worsening or unresolving symptoms.    ED Prescriptions     Medication Sig Dispense Auth. Provider   amoxicillin -clavulanate (AUGMENTIN ) 875-125 MG tablet Take 1 tablet by mouth every 12 (twelve) hours. 14 tablet Stuart Vernell Norris, PA-C   fluconazole  (DIFLUCAN ) 150 MG tablet Take 1 tablet (150 mg total) by mouth once for 1 dose. 1 tablet Fintan Grater Elizabeth, PA-C   albuterol  (VENTOLIN  HFA) 108 (90 Base) MCG/ACT inhaler Inhale 2 puffs into the lungs every 4 (four) hours as needed. 18 g Stuart Vernell Norris, [place]      PDMP not reviewed this encounter.   Stuart Vernell Norris, PA-C 11/01/24 1349

## 2024-11-01 NOTE — Discharge Instructions (Signed)
 Your urine test did not show evidence of a urinary tract infection.  We have sent out a vaginal swab for further evaluation.  We have given you a steroid shot today and I have prescribed an antibiotic, an albuterol  inhaler refill and a Diflucan .  Continue over-the-counter cold and congestion medications, Astelin nasal spray and other remedies as needed.  Return for worsening or unresolving symptoms.

## 2024-11-01 NOTE — ED Triage Notes (Signed)
 Pt reports cough, congestion, swelling in the glands of her throat, hoarseness fatigueas tried Vicks cold and flu, Mucinex, coughing up phlegm  As well as home remedies. Vaginal irritation, making small amounts of urine,strong odor to the urine.

## 2024-11-02 ENCOUNTER — Ambulatory Visit: Attending: Family Medicine | Admitting: Occupational Therapy

## 2024-11-02 LAB — CERVICOVAGINAL ANCILLARY ONLY
Bacterial Vaginitis (gardnerella): NEGATIVE
Candida Glabrata: NEGATIVE
Candida Vaginitis: NEGATIVE
Comment: NEGATIVE
Comment: NEGATIVE
Comment: NEGATIVE

## 2024-11-03 NOTE — Progress Notes (Unsigned)
° °  Kelsey Walton - 43 y.o. female MRN [id number]  Date of birth: [birth year]  Office Visit Note: Visit Date: 11/04/2024 PCP: Kelsey Buel HERO, MD Referred by: Kelsey Buel HERO, MD  Subjective:  HPI: Kelsey Walton is a 43 y.o. female who presents today for follow up 10 weeks status post right wrist manipulation under anesthesia. Distal radioulnar joint stabilization with pinning.  Pertinent ROS were reviewed with the patient and found to be negative unless otherwise specified above in HPI.   Assessment & Plan: Visit Diagnoses: No diagnosis found.  Plan: ***  Follow-up: No follow-ups on file.   Meds & Orders: No orders of the defined types were placed in this encounter.  No orders of the defined types were placed in this encounter.    Procedures: No procedures performed       Objective:   Vital Signs: LMP 05/28/2019 (Within Days)   Ortho Exam ***  Imaging: No results found.   Kelsey Walton, M.D. [facility], Hand Surgery

## 2024-11-04 ENCOUNTER — Ambulatory Visit: Attending: Family Medicine | Admitting: Occupational Therapy

## 2024-11-04 ENCOUNTER — Ambulatory Visit: Admitting: Orthopedic Surgery

## 2024-11-04 ENCOUNTER — Ambulatory Visit: Payer: Self-pay

## 2024-11-04 DIAGNOSIS — R29898 Other symptoms and signs involving the musculoskeletal system: Secondary | ICD-10-CM | POA: Diagnosis present

## 2024-11-04 DIAGNOSIS — R278 Other lack of coordination: Secondary | ICD-10-CM | POA: Diagnosis present

## 2024-11-04 DIAGNOSIS — R208 Other disturbances of skin sensation: Secondary | ICD-10-CM | POA: Insufficient documentation

## 2024-11-04 DIAGNOSIS — M6281 Muscle weakness (generalized): Secondary | ICD-10-CM | POA: Diagnosis present

## 2024-11-04 DIAGNOSIS — Z8781 Personal history of (healed) traumatic fracture: Secondary | ICD-10-CM | POA: Diagnosis not present

## 2024-11-04 DIAGNOSIS — R29818 Other symptoms and signs involving the nervous system: Secondary | ICD-10-CM

## 2024-11-04 DIAGNOSIS — M25531 Pain in right wrist: Secondary | ICD-10-CM

## 2024-11-04 DIAGNOSIS — M25641 Stiffness of right hand, not elsewhere classified: Secondary | ICD-10-CM | POA: Insufficient documentation

## 2024-11-04 DIAGNOSIS — M79641 Pain in right hand: Secondary | ICD-10-CM | POA: Insufficient documentation

## 2024-11-04 DIAGNOSIS — Z9889 Other specified postprocedural states: Secondary | ICD-10-CM

## 2024-11-04 DIAGNOSIS — M25631 Stiffness of right wrist, not elsewhere classified: Secondary | ICD-10-CM

## 2024-11-04 DIAGNOSIS — R600 Localized edema: Secondary | ICD-10-CM

## 2024-11-04 NOTE — Therapy (Signed)
 OUTPATIENT OCCUPATIONAL THERAPY ORTHO TREATMENT  Patient Name: Kelsey Walton MRN: [id number] DOB:January 17, 1981, 43 y.o., female Today's Date: 11/04/2024  PCP: Adina Buel HERO, MD REFERRING PROVIDER: [name] Buster, MD   END OF SESSION:  OT End of Session - 11/04/24 1105     Visit Number 8    Number of Visits 20    Date for Recertification  12/31/24    Authorization Type [employer]    Authorization Time Period approved 15 visits 10/16 - 11/24/24    OT Start Time 1106    OT Stop Time 1144    OT Time Calculation (min) 38 min    Activity Tolerance Patient tolerated treatment well    Behavior During Therapy WFL for tasks assessed/performed         Past Medical History:  Diagnosis Date   Anxiety    Arthritis    Depression    PTSD (post-traumatic stress disorder)    Seizures (HCC) 09/26/2019   teeth clencing/ anxiety are symtoms of seizures   Past Surgical History:  Procedure Laterality Date   ABDOMINAL HYSTERECTOMY N/A 06/30/2019   Procedure: HYSTERECTOMY ABDOMINAL;  Surgeon: Jayne Vonn DEL, MD;  Location: [facility];  Service: Gynecology;  Laterality: N/A;   ARTHROTOMY,JOINT STABILIZATIOIN,W/TRIANGULAR FIBROCARTILAGE COMPLEX REPAIR Right 08/27/2024   Procedure: RIGHT WRIST MANIPULATION UNDER ANESTHESIA, DISTAL RADIOULNAR JOINT STABILIZATIOIN WITH PINNING;  Surgeon: [name] Buster, MD;  Location: [facility];  Service: Orthopedics;  Laterality: Right;   AUGMENTATION MAMMAPLASTY Bilateral 2008   saline   BILATERAL SALPINGECTOMY Bilateral 06/30/2019   Procedure: OPEN BILATERAL SALPINGECTOMY;  Surgeon: Jayne Vonn DEL, MD;  Location: [facility];  Service: Gynecology;  Laterality: Bilateral;   DILATION AND CURETTAGE OF UTERUS     OPEN REDUCTION INTERNAL FIXATION (ORIF) DISTAL RADIAL FRACTURE Right 06/08/2024   Procedure: OPEN REDUCTION INTERNAL FIXATION (ORIF) DISTAL RADIUS FRACTURE;  Surgeon: [name] Buster, MD;  Location: [facility];  Service:  Orthopedics;  Laterality: Right;  RIGHT WRIST OPEN REDUCTION INTERNAL FIXATION DISTAL RADIUS AND POSSIBLE ULNAR STYLOID   TUBAL LIGATION     Patient Active Problem List   Diagnosis Date Noted   DRUJ (distal radioulnar joint) instability, post-traumatic, right 08/27/2024   Wrist stiffness, right 08/27/2024   Fracture, Colles, right, closed 06/08/2024   S/P hysterectomy 06/30/2019   Breast mass, right 07/17/2015    ONSET DATE: 08/16/2024   REFERRING DIAG: Z98.890,Z87.81 (ICD-10-CM) - S/P ORIF (open reduction internal fixation) fracture   Needs OT 2 weeks s/p right wrist stabilization and joint exploration 08/27/24   PROCEDURE 08/27/24 Right wrist manipulation under anesthesia Distal radioulnar joint stabilization with pinning  THERAPY DIAG:  Pain in right wrist  Stiffness of right hand, not elsewhere classified  Stiffness of right wrist, not elsewhere classified  Other lack of coordination  Muscle weakness (generalized)  Pain in right hand  Other disturbances of skin sensation  Other symptoms and signs involving the musculoskeletal system  Localized edema  Other symptoms and signs involving the nervous system  Rationale for Evaluation and Treatment: Rehabilitation  SUBJECTIVE:   SUBJECTIVE STATEMENT: She was sick last week and was unable to be seen. Ortho told her she may never get full wrist extension on her R. She also is not able to find a job with current work recommendations.   Pt accompanied by: self  PERTINENT HISTORY: Right distal radius fracture open reduction internal fixation 06/08/24. PMH: Anxiety, seizures, PTSD   PRECAUTIONS: no lifting > 10 lbs  RED FLAGS: Extreme  pain/stiffness   WEIGHT BEARING RESTRICTIONS: none  PAIN:  Are you having pain? Yes: NPRS scale: 3-10/10 Pain location: RUE Pain description: sore Aggravating factors: certain movements Relieving factors: rest/supporting arm  FALLS: Has patient fallen in last 6 months? Yes.  Number of falls 1  LIVING ENVIRONMENT: Lives with: daughters live with patient (70 and 3)  Lives in: House/apartment Stairs: 7 steps to enter, 1 level  Has following equipment at home: None  PLOF: Independent  PATIENT GOALS: GET MY LIFE BACK   NEXT MD VISIT: 2 weeks  OBJECTIVE:  Note: Objective measures were completed at Evaluation unless otherwise noted.  HAND DOMINANCE: Right  ADLs: Overall ADLs: mod I w/ BADLS using Lt hand however requires assist prn for 2 handed tasks and some IADLS  FUNCTIONAL OUTCOME MEASURES: Quick Dash: 68.18% deficit (d/t RUE)   UPPER EXTREMITY ROM:   RUE: shoulder WFL's but difficulty and prone to stiffness, see below for elbow flex/ext, pt demo no active sup/pron today, unable to assess wrist d/t cast, finger and thumb ROM limited w/ thumb opposition barely to index finger only, MP flexion limited d/t cast, PIP flex digits 2-5 approx 75% and composite flexion less than 50%  Active ROM Right eval Left eval  Shoulder flexion WFL's   Elbow flexion 130*   Elbow extension -43*   Wrist pronation unable   Wrist supination unable   (Blank rows = not tested)   UPPER EXTREMITY MMT:   Not tested d/t pain and precautions at wrist/forearm  HAND FUNCTION: 09/23/2024: Grip strength: Right: 9.9 lbs; Left: 59 lbs 10/07/24: Rt = 19.4 lbs  COORDINATION: Unable to pick up anything small Rt hand d/t limited ROM  SENSATION: Not tested  EDEMA: Mild at fingers  COGNITION: Overall cognitive status: pt limited by pain and restless/anxious. Difficulty focusing on therapist when trying to review and update HEP  Areas of impairment: attention, behavior  OBSERVATIONS: Pt arrived in cast directly from MD office. Pin removed and casted prior to this O.T. evaluation  TREATMENT:   - Manual therapy completed for duration as noted below including: Therapist completed IASTM using tissue mover tool over R hand and wrist using free up lotion as emollient for  reduction of scar tissue and to promote improved AROM and pain reduction of affected extremity.   - Therapeutic exercises completed for duration as noted below including: Pt completed R wrist extension PROM including stretch with soft strap to help minimize dorsal wrist impingement by wrapping the strap around dorsal wrist and proving pull as pt pressed through pam and forearm. OT educated pt on R digit extension/abduction strengthening using hair tie. Cues for pace to hold stretch 5 seconds and to engage pinky.  Pt completed static prolonged R wrist extension and supination stretch over edge of table with foam wedge support. Pt tolerating up to 3 lbs.  PATIENT EDUCATION: Education details: see above Person educated: Patient Education method: Explanation, Demonstration, and Verbal cues Education comprehension: verbalized understanding, returned demonstration, verbal cues required, and needs further education  HOME EXERCISE PROGRAM: 09/09/24: reprint of 07/29/24 HEP except last 3 exercises, verbally instructed to add PROM to fingers/thumb, and shoulder ROM ex's 09/23/2024: putty and hammer HEP 10/07/24: updated HEP  10/15/2024: [place] instructions  GOALS: Goals reviewed with patient? Yes  SHORT TERM GOALS: Target date: 11/24/2024  Independent with appropriate HEP following recent sx Baseline: issued, will need updates/modifications when cast comes off Goal status: IN PROGRESS  2.  Pt to improve elbow extension to -30* or  less RUE Baseline: -43* Goal status: IN PROGRESS  3.  Pt to have 75% or greater composite flexion Rt hand for gross grasp/release Baseline: less than 50% Goal status: IN PROGRESS  4.  Pt to report pain less than or equal to 5/10 with HEP Baseline: 10/10 Goal status: IN PROGRESS  5.  Pt to oppose thumb to ring finger for functional thumb use/movement Baseline: max difficulty to index finger Goal status: IN PROGRESS  6.  Pt to use RUE as min assist for bilateral  tasks Baseline: dependent at this time Goal status: INITIAL  LONG TERM GOALS: Target date: 12/31/24  Independent with updated P/ROM and strengthening HEP  Baseline: not yet issued d/t current precautions/cast Goal status: INITIAL  2.  Pt to demo 30* or greater wrist flex/ext RUE for functional use Baseline: unable to assess d/t cast Goal status: IN PROGRESS  3.  Pt to demo 45* forearm pronation and supination RUE Baseline: Neutral - unable at this time d/t stiffness and pain Goal status: IN PROGRESS  4.  RUE elbow flexion to 140* and extension to -25* or less Baseline: 130*, -43*  Goal status: IN PROGRESS  5.  Pt to return to using Rt hand for eating, grooming, and writing 90% or more of the time Baseline: currently using Lt non dominant hand Goal status: IN PROGRESS  6.  Quick Dash to improve to 45% or less deficit RUE Baseline: 68.18% Goal status: IN PROGRESS  ASSESSMENT:  CLINICAL IMPRESSION: Pt demonstrates improved R digit flexion and supination PROM  though remains limited with wrist extension and reported stiffness. Goal progression has been further limited by her number of missed visits due to illness. Recommend extension of skilled OT services until max rehab potential is met. Gradual weaning of brace to be completed during daytime tasks.   PERFORMANCE DEFICITS: in functional skills including ADLs, IADLs, coordination, dexterity, proprioception, sensation, edema, ROM, strength, pain, Fine motor control, Gross motor control, body mechanics, decreased knowledge of precautions, decreased knowledge of use of DME, and UE functional use, cognitive skills including emotional, and psychosocial skills including coping strategies.   IMPAIRMENTS: are limiting patient from ADLs, IADLs, rest and sleep, work, and leisure.   COMORBIDITIES: may have co-morbidities  that affects occupational performance. Patient will benefit from skilled OT to address above impairments and improve  overall function.  REHAB POTENTIAL: Fair limited by pain, significant stiffness, time since onset of initial injury  PLAN:  OT FREQUENCY: 1-2x/week  OT DURATION: 6 weeks  PLANNED INTERVENTIONS: 97535 self care/ADL training, 02889 therapeutic exercise, 97530 therapeutic activity, 97140 manual therapy, 97113 aquatic therapy, 97035 ultrasound, 97018 paraffin, 02960 fluidotherapy, 97010 moist heat, 97010 cryotherapy, 97034 contrast bath, 97032 electrical stimulation (manual), 97014 electrical stimulation unattended, 97760 Orthotic Initial, 97763 Orthotic/Prosthetic subsequent, scar mobilization, passive range of motion, compression bandaging, coping strategies training, patient/family education, and DME and/or AE instructions  RECOMMENDED OTHER SERVICES: none at this time  CONSULTED AND AGREED WITH PLAN OF CARE: Patient  PLAN FOR NEXT SESSION: assess goals, continue modalities prn, flex bar  UBE   For all possible CPT codes, reference the Planned Interventions line above.     Check all conditions that are expected to impact treatment: {Conditions expected to impact treatment:Neurological condition and/or seizures and Psychological or psychiatric disorders   If treatment provided at initial evaluation, no treatment charged due to lack of authorization.        Jocelyn CHRISTELLA Bottom, OT 11/04/2024, 3:06 PM

## 2024-11-24 ENCOUNTER — Ambulatory Visit: Admitting: Occupational Therapy

## 2024-11-24 DIAGNOSIS — M79641 Pain in right hand: Secondary | ICD-10-CM

## 2024-11-24 DIAGNOSIS — R29898 Other symptoms and signs involving the musculoskeletal system: Secondary | ICD-10-CM

## 2024-11-24 DIAGNOSIS — R600 Localized edema: Secondary | ICD-10-CM

## 2024-11-24 DIAGNOSIS — R278 Other lack of coordination: Secondary | ICD-10-CM

## 2024-11-24 DIAGNOSIS — M25641 Stiffness of right hand, not elsewhere classified: Secondary | ICD-10-CM

## 2024-11-24 DIAGNOSIS — M25531 Pain in right wrist: Secondary | ICD-10-CM

## 2024-11-24 DIAGNOSIS — M25631 Stiffness of right wrist, not elsewhere classified: Secondary | ICD-10-CM

## 2024-11-24 DIAGNOSIS — R29818 Other symptoms and signs involving the nervous system: Secondary | ICD-10-CM

## 2024-11-24 DIAGNOSIS — R208 Other disturbances of skin sensation: Secondary | ICD-10-CM

## 2024-11-24 DIAGNOSIS — M6281 Muscle weakness (generalized): Secondary | ICD-10-CM

## 2024-11-24 NOTE — Therapy (Signed)
 " OUTPATIENT OCCUPATIONAL THERAPY ORTHO TREATMENT  Patient Name: Kelsey Walton MRN: [id number] DOB:[birth year], 43 y.o., female Today's Date: 11/24/2024  PCP: Adina Buel HERO, MD REFERRING PROVIDER: [name] Buster, MD   END OF SESSION:  OT End of Session - 11/24/24 1036     Visit Number 9    Number of Visits 20    Date for Recertification  12/31/24    Authorization Type [employer]    Authorization Time Period approved 15 visits 10/16 - 11/24/24    OT Start Time 1029   pt arrived late   OT Stop Time 1100    OT Time Calculation (min) 31 min    Activity Tolerance Patient tolerated treatment well    Behavior During Therapy WFL for tasks assessed/performed         Past Medical History:  Diagnosis Date   Anxiety    Arthritis    Depression    PTSD (post-traumatic stress disorder)    Seizures (HCC) 09/26/2019   teeth clencing/ anxiety are symtoms of seizures   Past Surgical History:  Procedure Laterality Date   ABDOMINAL HYSTERECTOMY N/A 06/30/2019   Procedure: HYSTERECTOMY ABDOMINAL;  Surgeon: Jayne Vonn DEL, MD;  Location: [facility];  Service: Gynecology;  Laterality: N/A;   ARTHROTOMY,JOINT STABILIZATIOIN,W/TRIANGULAR FIBROCARTILAGE COMPLEX REPAIR Right 08/27/2024   Procedure: RIGHT WRIST MANIPULATION UNDER ANESTHESIA, DISTAL RADIOULNAR JOINT STABILIZATIOIN WITH PINNING;  Surgeon: [name] Buster, MD;  Location: [facility];  Service: Orthopedics;  Laterality: Right;   AUGMENTATION MAMMAPLASTY Bilateral 2008   saline   BILATERAL SALPINGECTOMY Bilateral 06/30/2019   Procedure: OPEN BILATERAL SALPINGECTOMY;  Surgeon: Jayne Vonn DEL, MD;  Location: [facility];  Service: Gynecology;  Laterality: Bilateral;   DILATION AND CURETTAGE OF UTERUS     OPEN REDUCTION INTERNAL FIXATION (ORIF) DISTAL RADIAL FRACTURE Right 06/08/2024   Procedure: OPEN REDUCTION INTERNAL FIXATION (ORIF) DISTAL RADIUS FRACTURE;  Surgeon: [name] Buster, MD;  Location: [facility];   Service: Orthopedics;  Laterality: Right;  RIGHT WRIST OPEN REDUCTION INTERNAL FIXATION DISTAL RADIUS AND POSSIBLE ULNAR STYLOID   TUBAL LIGATION     Patient Active Problem List   Diagnosis Date Noted   DRUJ (distal radioulnar joint) instability, post-traumatic, right 08/27/2024   Wrist stiffness, right 08/27/2024   Fracture, Colles, right, closed 06/08/2024   S/P hysterectomy 06/30/2019   Breast mass, right 07/17/2015    ONSET DATE: 08/16/2024   REFERRING DIAG: Z98.890,Z87.81 (ICD-10-CM) - S/P ORIF (open reduction internal fixation) fracture   Needs OT 2 weeks s/p right wrist stabilization and joint exploration 08/27/24   PROCEDURE 08/27/24 Right wrist manipulation under anesthesia Distal radioulnar joint stabilization with pinning  THERAPY DIAG:  Pain in right wrist  Stiffness of right hand, not elsewhere classified  Stiffness of right wrist, not elsewhere classified  Other lack of coordination  Muscle weakness (generalized)  Pain in right hand  Other symptoms and signs involving the musculoskeletal system  Other symptoms and signs involving the nervous system  Other disturbances of skin sensation  Localized edema  Rationale for Evaluation and Treatment: Rehabilitation  SUBJECTIVE:   SUBJECTIVE STATEMENT: She was unable to use her RUE as needed to use her gun as desired when deer hunting. It was so stressful to her, that she ended up having a seizure in her deer stand. She had a pop at the radial aspect of her wrist and was concerned for injury.  Pt accompanied by: self  PERTINENT HISTORY: Right distal radius fracture open reduction internal  fixation 06/08/24. PMH: Anxiety, seizures, PTSD   PRECAUTIONS: no lifting > 10 lbs  RED FLAGS: Extreme pain/stiffness   WEIGHT BEARING RESTRICTIONS: none  PAIN:  Are you having pain? Yes: NPRS scale: 3-10/10 Pain location: RUE Pain description: sore Aggravating factors: certain movements Relieving factors:  rest/supporting arm  FALLS: Has patient fallen in last 6 months? Yes. Number of falls 1  LIVING ENVIRONMENT: Lives with: daughters live with patient (76 and 87)  Lives in: House/apartment Stairs: 7 steps to enter, 1 level  Has following equipment at home: None  PLOF: Independent  PATIENT GOALS: GET MY LIFE BACK   NEXT MD VISIT: 2 weeks  OBJECTIVE:  Note: Objective measures were completed at Evaluation unless otherwise noted.  HAND DOMINANCE: Right  ADLs: Overall ADLs: mod I w/ BADLS using Lt hand however requires assist prn for 2 handed tasks and some IADLS  FUNCTIONAL OUTCOME MEASURES: Quick Dash: 68.18% deficit (d/t RUE)   UPPER EXTREMITY ROM:   RUE: shoulder WFL's but difficulty and prone to stiffness, see below for elbow flex/ext, pt demo no active sup/pron today, unable to assess wrist d/t cast, finger and thumb ROM limited w/ thumb opposition barely to index finger only, MP flexion limited d/t cast, PIP flex digits 2-5 approx 75% and composite flexion less than 50%  Active ROM Right eval Left eval  Shoulder flexion WFL's   Elbow flexion 130*   Elbow extension -43*   Wrist pronation unable   Wrist supination unable   (Blank rows = not tested)   UPPER EXTREMITY MMT:   Not tested d/t pain and precautions at wrist/forearm  HAND FUNCTION: 09/23/2024: Grip strength: Right: 9.9 lbs; Left: 59 lbs 10/07/24: Rt = 19.4 lbs  COORDINATION: Unable to pick up anything small Rt hand d/t limited ROM  SENSATION: Not tested  EDEMA: Mild at fingers  COGNITION: Overall cognitive status: pt limited by pain and restless/anxious. Difficulty focusing on therapist when trying to review and update HEP  Areas of impairment: attention, behavior  OBSERVATIONS: Pt arrived in cast directly from MD office. Pin removed and casted prior to this O.T. evaluation  TREATMENT:  - Therapeutic exercises completed for duration as noted below including:  Pt completed R wrist extension PROM  including stretch with soft strap to help minimize dorsal wrist impingement by wrapping the strap around dorsal wrist and proving pull as pt pressed through pam and forearm.   OT initiated 3 additional RUE stretches including place and holds (thumb flexion and composite flexion of digits 2-5 with concentration on DIP flexion) and isometric pinky abduction.  - Ultrasound completed for duration as noted below including:  Ultrasound applied to palmar and dorsal right hand, digits, and wrist for 8 minutes, frequency of 3 MHz, 20% duty cycle, and 1.1 W/cm with pt's arm placed on soft towel for promotion of ROM, edema reduction, and pain reduction in affected extremity. PATIENT EDUCATION: Education details: see above Person educated: Patient Education method: Explanation, Demonstration, and Verbal cues Education comprehension: verbalized understanding, returned demonstration, verbal cues required, and needs further education  HOME EXERCISE PROGRAM: 09/09/24: reprint of 07/29/24 HEP except last 3 exercises, verbally instructed to add PROM to fingers/thumb, and shoulder ROM ex's 09/23/2024: putty and hammer HEP 10/07/24: updated HEP  10/15/2024: Ktape instructions  GOALS: Goals reviewed with patient? Yes  SHORT TERM GOALS: Target date: 11/24/2024  Independent with appropriate HEP following recent sx Baseline: issued, will need updates/modifications when cast comes off Goal status: IN PROGRESS  2.  Pt to  improve elbow extension to -30* or less RUE Baseline: -43* Goal status: IN PROGRESS  3.  Pt to have 75% or greater composite flexion Rt hand for gross grasp/release Baseline: less than 50% Goal status: IN PROGRESS  4.  Pt to report pain less than or equal to 5/10 with HEP Baseline: 10/10 Goal status: IN PROGRESS  5.  Pt to oppose thumb to ring finger for functional thumb use/movement Baseline: max difficulty to index finger Goal status: IN PROGRESS  6.  Pt to use RUE as min assist  for bilateral tasks Baseline: dependent at this time Goal status: INITIAL  LONG TERM GOALS: Target date: 12/31/24  Independent with updated P/ROM and strengthening HEP  Baseline: not yet issued d/t current precautions/cast Goal status: INITIAL  2.  Pt to demo 30* or greater wrist flex/ext RUE for functional use Baseline: unable to assess d/t cast Goal status: IN PROGRESS  3.  Pt to demo 45* forearm pronation and supination RUE Baseline: Neutral - unable at this time d/t stiffness and pain Goal status: IN PROGRESS  4.  RUE elbow flexion to 140* and extension to -25* or less Baseline: 130*, -43*  Goal status: IN PROGRESS  5.  Pt to return to using Rt hand for eating, grooming, and writing 90% or more of the time Baseline: currently using Lt non dominant hand Goal status: IN PROGRESS  6.  Quick Dash to improve to 45% or less deficit RUE Baseline: 68.18% Goal status: IN PROGRESS  ASSESSMENT:  CLINICAL IMPRESSION: Pt demonstrates improved R digit flexion and supination PROM  though remains limited with wrist extension and reported stiffness. Gradual weaning of brace to be completed during daytime tasks. Pt report of popping at radial wrist likely scar tissue but will continue to monitor.   PERFORMANCE DEFICITS: in functional skills including ADLs, IADLs, coordination, dexterity, proprioception, sensation, edema, ROM, strength, pain, Fine motor control, Gross motor control, body mechanics, decreased knowledge of precautions, decreased knowledge of use of DME, and UE functional use, cognitive skills including emotional, and psychosocial skills including coping strategies.   IMPAIRMENTS: are limiting patient from ADLs, IADLs, rest and sleep, work, and leisure.   COMORBIDITIES: may have co-morbidities  that affects occupational performance. Patient will benefit from skilled OT to address above impairments and improve overall function.  REHAB POTENTIAL: Fair limited by pain, significant  stiffness, time since onset of initial injury  PLAN:  OT FREQUENCY: 1-2x/week  OT DURATION: 6 weeks  PLANNED INTERVENTIONS: 97535 self care/ADL training, 02889 therapeutic exercise, 97530 therapeutic activity, 97140 manual therapy, 97113 aquatic therapy, 97035 ultrasound, 97018 paraffin, 02960 fluidotherapy, 97010 moist heat, 97010 cryotherapy, 97034 contrast bath, 97032 electrical stimulation (manual), 97014 electrical stimulation unattended, 97760 Orthotic Initial, 97763 Orthotic/Prosthetic subsequent, scar mobilization, passive range of motion, compression bandaging, coping strategies training, patient/family education, and DME and/or AE instructions  RECOMMENDED OTHER SERVICES: none at this time  CONSULTED AND AGREED WITH PLAN OF CARE: Patient  PLAN FOR NEXT SESSION: assess goals, continue modalities prn, flex bar  How is radial wrist?  Decrease brace wear  UBE   For all possible CPT codes, reference the Planned Interventions line above.     Check all conditions that are expected to impact treatment: {Conditions expected to impact treatment:Neurological condition and/or seizures and Psychological or psychiatric disorders   If treatment provided at initial evaluation, no treatment charged due to lack of authorization.        Jocelyn CHRISTELLA Bottom, OT 11/24/2024, 4:09 PM   "

## 2024-11-30 ENCOUNTER — Ambulatory Visit: Admitting: Occupational Therapy

## 2024-12-02 ENCOUNTER — Ambulatory Visit: Attending: Family Medicine | Admitting: Occupational Therapy

## 2024-12-02 DIAGNOSIS — R29818 Other symptoms and signs involving the nervous system: Secondary | ICD-10-CM | POA: Diagnosis present

## 2024-12-02 DIAGNOSIS — R208 Other disturbances of skin sensation: Secondary | ICD-10-CM | POA: Insufficient documentation

## 2024-12-02 DIAGNOSIS — R29898 Other symptoms and signs involving the musculoskeletal system: Secondary | ICD-10-CM | POA: Insufficient documentation

## 2024-12-02 DIAGNOSIS — M25631 Stiffness of right wrist, not elsewhere classified: Secondary | ICD-10-CM | POA: Insufficient documentation

## 2024-12-02 DIAGNOSIS — M79641 Pain in right hand: Secondary | ICD-10-CM | POA: Insufficient documentation

## 2024-12-02 DIAGNOSIS — M25641 Stiffness of right hand, not elsewhere classified: Secondary | ICD-10-CM | POA: Insufficient documentation

## 2024-12-02 DIAGNOSIS — M6281 Muscle weakness (generalized): Secondary | ICD-10-CM | POA: Diagnosis present

## 2024-12-02 DIAGNOSIS — R600 Localized edema: Secondary | ICD-10-CM | POA: Diagnosis present

## 2024-12-02 DIAGNOSIS — R278 Other lack of coordination: Secondary | ICD-10-CM | POA: Diagnosis present

## 2024-12-02 DIAGNOSIS — M25531 Pain in right wrist: Secondary | ICD-10-CM | POA: Diagnosis present

## 2024-12-02 NOTE — Therapy (Signed)
 " OUTPATIENT OCCUPATIONAL THERAPY ORTHO TREATMENT  Patient Name: Kelsey Walton MRN: [id number] DOB:1980-12-17, 44 y.o., female Today's Date: 12/02/2024  PCP: Adina Buel HERO, MD REFERRING PROVIDER: [name] Buster, MD   END OF SESSION:  OT End of Session - 12/02/24 1112     Visit Number 10    Number of Visits 20    Date for Recertification  12/31/24    Authorization Type [employer]    Authorization Time Period approved 15 visits 10/16 - 11/24/24    OT Start Time 1111    OT Stop Time 1150    OT Time Calculation (min) 39 min    Activity Tolerance Patient tolerated treatment well    Behavior During Therapy WFL for tasks assessed/performed         Past Medical History:  Diagnosis Date   Anxiety    Arthritis    Depression    PTSD (post-traumatic stress disorder)    Seizures (HCC) 09/26/2019   teeth clencing/ anxiety are symtoms of seizures   Past Surgical History:  Procedure Laterality Date   ABDOMINAL HYSTERECTOMY N/A 06/30/2019   Procedure: HYSTERECTOMY ABDOMINAL;  Surgeon: Jayne Vonn DEL, MD;  Location: [facility];  Service: Gynecology;  Laterality: N/A;   ARTHROTOMY,JOINT STABILIZATIOIN,W/TRIANGULAR FIBROCARTILAGE COMPLEX REPAIR Right 08/27/2024   Procedure: RIGHT WRIST MANIPULATION UNDER ANESTHESIA, DISTAL RADIOULNAR JOINT STABILIZATIOIN WITH PINNING;  Surgeon: [name] Buster, MD;  Location: [facility];  Service: Orthopedics;  Laterality: Right;   AUGMENTATION MAMMAPLASTY Bilateral 2008   saline   BILATERAL SALPINGECTOMY Bilateral 06/30/2019   Procedure: OPEN BILATERAL SALPINGECTOMY;  Surgeon: Jayne Vonn DEL, MD;  Location: [facility];  Service: Gynecology;  Laterality: Bilateral;   DILATION AND CURETTAGE OF UTERUS     OPEN REDUCTION INTERNAL FIXATION (ORIF) DISTAL RADIAL FRACTURE Right 06/08/2024   Procedure: OPEN REDUCTION INTERNAL FIXATION (ORIF) DISTAL RADIUS FRACTURE;  Surgeon: [name] Buster, MD;  Location: [facility];  Service: Orthopedics;   Laterality: Right;  RIGHT WRIST OPEN REDUCTION INTERNAL FIXATION DISTAL RADIUS AND POSSIBLE ULNAR STYLOID   TUBAL LIGATION     Patient Active Problem List   Diagnosis Date Noted   DRUJ (distal radioulnar joint) instability, post-traumatic, right 08/27/2024   Wrist stiffness, right 08/27/2024   Fracture, Colles, right, closed 06/08/2024   S/P hysterectomy 06/30/2019   Breast mass, right 07/17/2015    ONSET DATE: 08/16/2024   REFERRING DIAG: Z98.890,Z87.81 (ICD-10-CM) - S/P ORIF (open reduction internal fixation) fracture   Needs OT 2 weeks s/p right wrist stabilization and joint exploration 08/27/24   PROCEDURE 08/27/24 Right wrist manipulation under anesthesia Distal radioulnar joint stabilization with pinning  THERAPY DIAG:  Pain in right wrist  Stiffness of right hand, not elsewhere classified  Stiffness of right wrist, not elsewhere classified  Other lack of coordination  Muscle weakness (generalized)  Pain in right hand  Other symptoms and signs involving the musculoskeletal system  Other symptoms and signs involving the nervous system  Other disturbances of skin sensation  Localized edema  Rationale for Evaluation and Treatment: Rehabilitation  SUBJECTIVE:   SUBJECTIVE STATEMENT: She was gifted a [employer] for her birthday and has been using it as part of her home therapy to improve ROM and strength in her R hand.   Pt accompanied by: self  PERTINENT HISTORY: Right distal radius fracture open reduction internal fixation 06/08/24. PMH: Anxiety, seizures, PTSD   PRECAUTIONS: no lifting > 10 lbs  RED FLAGS: Extreme pain/stiffness   WEIGHT BEARING RESTRICTIONS: none  PAIN:  Are you having pain? Yes: NPRS scale: 3-10/10 Pain location: RUE Pain description: sore Aggravating factors: certain movements Relieving factors: rest/supporting arm  FALLS: Has patient fallen in last 6 months? Yes. Number of falls 1  LIVING ENVIRONMENT: Lives with: daughters  live with patient (4 and 77)  Lives in: House/apartment Stairs: 7 steps to enter, 1 level  Has following equipment at home: None  PLOF: Independent  PATIENT GOALS: GET MY LIFE BACK   NEXT MD VISIT: 2 weeks  OBJECTIVE:  Note: Objective measures were completed at Evaluation unless otherwise noted.  HAND DOMINANCE: Right  ADLs: Overall ADLs: mod I w/ BADLS using Lt hand however requires assist prn for 2 handed tasks and some IADLS  FUNCTIONAL OUTCOME MEASURES: Quick Dash: 68.18% deficit (d/t RUE)   UPPER EXTREMITY ROM:   RUE: shoulder WFL's but difficulty and prone to stiffness, see below for elbow flex/ext, pt demo no active sup/pron today, unable to assess wrist d/t cast, finger and thumb ROM limited w/ thumb opposition barely to index finger only, MP flexion limited d/t cast, PIP flex digits 2-5 approx 75% and composite flexion less than 50%  Active ROM Right eval Left eval  Shoulder flexion WFL's   Elbow flexion 130*   Elbow extension -43*   Wrist pronation unable   Wrist supination unable   (Blank rows = not tested)   UPPER EXTREMITY MMT:   Not tested d/t pain and precautions at wrist/forearm  HAND FUNCTION: 09/23/2024: Grip strength: Right: 9.9 lbs; Left: 59 lbs 10/07/24: Rt = 19.4 lbs  COORDINATION: Unable to pick up anything small Rt hand d/t limited ROM  SENSATION: Not tested  EDEMA: Mild at fingers  COGNITION: Overall cognitive status: pt limited by pain and restless/anxious. Difficulty focusing on therapist when trying to review and update HEP  Areas of impairment: attention, behavior  OBSERVATIONS: Pt arrived in cast directly from MD office. Pin removed and casted prior to this O.T. evaluation  TREATMENT:  - Therapeutic exercises completed for duration as noted below including:  Pt completed arm bike in sitting for 7.5 minutes with average RPM of 30 at level 9 for endurance using RUE only, ROM, and strengthening of affected extremity using  reciprocal arm movements. Pt alternating direction of pedaling halfway through. Intermittent cues provided to maintain stability with respect to anterior/posterior trunk lean and consistent grasp maintenance.  OT completed R wrist extension and flexion as well as digit flexion PROM to help improve ROM.   Dowel cone pushes all 4 quadrants holding blue foam tubing for radial and ulnar deviation with RUE pronated for improved ROM. Pt cued to maintain consistent grasp.   OT initiated [employer] pushes palm and back of hand for improved R wrist ROM.   - Ultrasound completed for duration as noted below including:  Ultrasound applied to palmar and dorsal right hand, digits, and wrist for 8 minutes, frequency of 3 MHz, 20% duty cycle, and 1.1 W/cm with pt's arm placed on soft towel for promotion of ROM, edema reduction, and pain reduction in affected extremity. PATIENT EDUCATION: Education details: see above Person educated: Patient Education method: Explanation, Demonstration, and Verbal cues Education comprehension: verbalized understanding, returned demonstration, verbal cues required, and needs further education  HOME EXERCISE PROGRAM: 09/09/24: reprint of 07/29/24 HEP except last 3 exercises, verbally instructed to add PROM to fingers/thumb, and shoulder ROM ex's 09/23/2024: putty and hammer HEP 10/07/24: updated HEP  10/15/2024: Ktape instructions  GOALS: Goals reviewed with patient? Yes  SHORT  TERM GOALS: Target date: 11/24/2024  Independent with appropriate HEP following recent sx Baseline: issued, will need updates/modifications when cast comes off Goal status: IN PROGRESS  2.  Pt to improve elbow extension to -30* or less RUE Baseline: -43* Goal status: IN PROGRESS  3.  Pt to have 75% or greater composite flexion Rt hand for gross grasp/release Baseline: less than 50% Goal status: IN PROGRESS  4.  Pt to report pain less than or equal to 5/10 with HEP Baseline: 10/10 Goal  status: IN PROGRESS  5.  Pt to oppose thumb to ring finger for functional thumb use/movement Baseline: max difficulty to index finger Goal status: IN PROGRESS  6.  Pt to use RUE as min assist for bilateral tasks Baseline: dependent at this time Goal status: INITIAL  LONG TERM GOALS: Target date: 12/31/24  Independent with updated P/ROM and strengthening HEP  Baseline: not yet issued d/t current precautions/cast Goal status: INITIAL  2.  Pt to demo 30* or greater wrist flex/ext RUE for functional use Baseline: unable to assess d/t cast Goal status: IN PROGRESS  3.  Pt to demo 45* forearm pronation and supination RUE Baseline: Neutral - unable at this time d/t stiffness and pain Goal status: IN PROGRESS  4.  RUE elbow flexion to 140* and extension to -25* or less Baseline: 130*, -43*  Goal status: IN PROGRESS  5.  Pt to return to using Rt hand for eating, grooming, and writing 90% or more of the time Baseline: currently using Lt non dominant hand Goal status: IN PROGRESS  6.  Quick Dash to improve to 45% or less deficit RUE Baseline: 68.18% Goal status: IN PROGRESS  ASSESSMENT:  CLINICAL IMPRESSION: Pt demonstrates good tolerance to therapy today to improve RUE ROM, pain, and overall functional use. No palpable differences at R radial styloid in comparison to L though edema noted. Progress towards remaining goals.   PERFORMANCE DEFICITS: in functional skills including ADLs, IADLs, coordination, dexterity, proprioception, sensation, edema, ROM, strength, pain, Fine motor control, Gross motor control, body mechanics, decreased knowledge of precautions, decreased knowledge of use of DME, and UE functional use, cognitive skills including emotional, and psychosocial skills including coping strategies.   IMPAIRMENTS: are limiting patient from ADLs, IADLs, rest and sleep, work, and leisure.   COMORBIDITIES: may have co-morbidities  that affects occupational performance. Patient will  benefit from skilled OT to address above impairments and improve overall function.  REHAB POTENTIAL: Fair limited by pain, significant stiffness, time since onset of initial injury  PLAN:  OT FREQUENCY: 1-2x/week  OT DURATION: 6 weeks  PLANNED INTERVENTIONS: 97535 self care/ADL training, 02889 therapeutic exercise, 97530 therapeutic activity, 97140 manual therapy, 97113 aquatic therapy, 97035 ultrasound, 97018 paraffin, 02960 fluidotherapy, 97010 moist heat, 97010 cryotherapy, 97034 contrast bath, 97032 electrical stimulation (manual), 97014 electrical stimulation unattended, 97760 Orthotic Initial, 97763 Orthotic/Prosthetic subsequent, scar mobilization, passive range of motion, compression bandaging, coping strategies training, patient/family education, and DME and/or AE instructions  RECOMMENDED OTHER SERVICES: none at this time  CONSULTED AND AGREED WITH PLAN OF CARE: Patient  PLAN FOR NEXT SESSION: assess goals, continue modalities prn, flex bar  rebounder  For all possible CPT codes, reference the Planned Interventions line above.     Check all conditions that are expected to impact treatment: {Conditions expected to impact treatment:Neurological condition and/or seizures and Psychological or psychiatric disorders   If treatment provided at initial evaluation, no treatment charged due to lack of authorization.  Jocelyn CHRISTELLA Bottom, OT 12/02/2024, 11:13 AM   "

## 2024-12-07 ENCOUNTER — Ambulatory Visit: Admitting: Occupational Therapy

## 2024-12-08 ENCOUNTER — Ambulatory Visit: Admitting: Occupational Therapy

## 2024-12-09 ENCOUNTER — Ambulatory Visit: Admitting: Occupational Therapy

## 2024-12-14 ENCOUNTER — Ambulatory Visit: Admitting: Occupational Therapy

## 2024-12-16 ENCOUNTER — Ambulatory Visit: Admitting: Occupational Therapy

## 2024-12-16 DIAGNOSIS — M6281 Muscle weakness (generalized): Secondary | ICD-10-CM

## 2024-12-16 DIAGNOSIS — R29818 Other symptoms and signs involving the nervous system: Secondary | ICD-10-CM

## 2024-12-16 DIAGNOSIS — R278 Other lack of coordination: Secondary | ICD-10-CM

## 2024-12-16 DIAGNOSIS — R208 Other disturbances of skin sensation: Secondary | ICD-10-CM

## 2024-12-16 DIAGNOSIS — M25631 Stiffness of right wrist, not elsewhere classified: Secondary | ICD-10-CM

## 2024-12-16 DIAGNOSIS — M25531 Pain in right wrist: Secondary | ICD-10-CM

## 2024-12-16 DIAGNOSIS — M25641 Stiffness of right hand, not elsewhere classified: Secondary | ICD-10-CM

## 2024-12-16 DIAGNOSIS — R600 Localized edema: Secondary | ICD-10-CM

## 2024-12-16 DIAGNOSIS — R29898 Other symptoms and signs involving the musculoskeletal system: Secondary | ICD-10-CM

## 2024-12-16 DIAGNOSIS — M79641 Pain in right hand: Secondary | ICD-10-CM

## 2024-12-16 NOTE — Therapy (Signed)
 " OUTPATIENT OCCUPATIONAL THERAPY ORTHO TREATMENT  Patient Name: Kelsey Walton MRN: [id number] DOB:05-09-1981, 44 y.o., female Today's Date: 12/16/2024  PCP: Adina Buel HERO, MD REFERRING PROVIDER: [name] Buster, MD   END OF SESSION:  OT End of Session - 12/16/24 1633     Visit Number 11    Number of Visits 20    Date for Recertification  12/31/24    Authorization Type [employer]    Authorization Time Period approved 15 visits 10/16 - 11/24/24    OT Start Time 1109    OT Stop Time 1147    OT Time Calculation (min) 38 min    Activity Tolerance Patient tolerated treatment well    Behavior During Therapy WFL for tasks assessed/performed         Past Medical History:  Diagnosis Date   Anxiety    Arthritis    Depression    PTSD (post-traumatic stress disorder)    Seizures (HCC) 09/26/2019   teeth clencing/ anxiety are symtoms of seizures   Past Surgical History:  Procedure Laterality Date   ABDOMINAL HYSTERECTOMY N/A 06/30/2019   Procedure: HYSTERECTOMY ABDOMINAL;  Surgeon: Jayne Vonn DEL, MD;  Location: [facility];  Service: Gynecology;  Laterality: N/A;   ARTHROTOMY,JOINT STABILIZATIOIN,W/TRIANGULAR FIBROCARTILAGE COMPLEX REPAIR Right 08/27/2024   Procedure: RIGHT WRIST MANIPULATION UNDER ANESTHESIA, DISTAL RADIOULNAR JOINT STABILIZATIOIN WITH PINNING;  Surgeon: [name] Buster, MD;  Location: [facility];  Service: Orthopedics;  Laterality: Right;   AUGMENTATION MAMMAPLASTY Bilateral 2008   saline   BILATERAL SALPINGECTOMY Bilateral 06/30/2019   Procedure: OPEN BILATERAL SALPINGECTOMY;  Surgeon: Jayne Vonn DEL, MD;  Location: [facility];  Service: Gynecology;  Laterality: Bilateral;   DILATION AND CURETTAGE OF UTERUS     OPEN REDUCTION INTERNAL FIXATION (ORIF) DISTAL RADIAL FRACTURE Right 06/08/2024   Procedure: OPEN REDUCTION INTERNAL FIXATION (ORIF) DISTAL RADIUS FRACTURE;  Surgeon: [name] Buster, MD;  Location: [facility];  Service:  Orthopedics;  Laterality: Right;  RIGHT WRIST OPEN REDUCTION INTERNAL FIXATION DISTAL RADIUS AND POSSIBLE ULNAR STYLOID   TUBAL LIGATION     Patient Active Problem List   Diagnosis Date Noted   DRUJ (distal radioulnar joint) instability, post-traumatic, right 08/27/2024   Wrist stiffness, right 08/27/2024   Fracture, [place], right, closed 06/08/2024   S/P hysterectomy 06/30/2019   Breast mass, right 07/17/2015    ONSET DATE: 08/16/2024   REFERRING DIAG: Z98.890,Z87.81 (ICD-10-CM) - S/P ORIF (open reduction internal fixation) fracture   Needs OT 2 weeks s/p right wrist stabilization and joint exploration 08/27/24   PROCEDURE 08/27/24 Right wrist manipulation under anesthesia Distal radioulnar joint stabilization with pinning  THERAPY DIAG:  Pain in right wrist  Stiffness of right hand, not elsewhere classified  Other lack of coordination  Stiffness of right wrist, not elsewhere classified  Muscle weakness (generalized)  Pain in right hand  Other symptoms and signs involving the nervous system  Other symptoms and signs involving the musculoskeletal system  Other disturbances of skin sensation  Localized edema  Rationale for Evaluation and Treatment: Rehabilitation  SUBJECTIVE:   SUBJECTIVE STATEMENT: She feels like she did something to her R hand. She has had increased pain and swelling following use without the brace. Symptoms are most noticeable at the radial and ulnar styloids with any wrist movement and pressure.  Pt accompanied by: self  PERTINENT HISTORY: Right distal radius fracture open reduction internal fixation 06/08/24. PMH: Anxiety, seizures, PTSD   PRECAUTIONS: no lifting > 10 lbs  RED FLAGS:  Extreme pain/stiffness   WEIGHT BEARING RESTRICTIONS: none  PAIN:  Are you having pain? Yes: NPRS scale: 5-10/10 Pain location: RUE Pain description: sore Aggravating factors: certain movements Relieving factors: rest/supporting arm  FALLS: Has patient  fallen in last 6 months? Yes. Number of falls 1  LIVING ENVIRONMENT: Lives with: daughters live with patient (83 and 88)  Lives in: House/apartment Stairs: 7 steps to enter, 1 level  Has following equipment at home: None  PLOF: Independent  PATIENT GOALS: GET MY LIFE BACK   NEXT MD VISIT: 2 weeks  OBJECTIVE:  Note: Objective measures were completed at Evaluation unless otherwise noted.  HAND DOMINANCE: Right  ADLs: Overall ADLs: mod I w/ BADLS using Lt hand however requires assist prn for 2 handed tasks and some IADLS  FUNCTIONAL OUTCOME MEASURES: Quick Dash: 68.18% deficit (d/t RUE)   UPPER EXTREMITY ROM:   RUE: shoulder WFL's but difficulty and prone to stiffness, see below for elbow flex/ext, pt demo no active sup/pron today, unable to assess wrist d/t cast, finger and thumb ROM limited w/ thumb opposition barely to index finger only, MP flexion limited d/t cast, PIP flex digits 2-5 approx 75% and composite flexion less than 50%  Active ROM Right eval Left eval  Shoulder flexion WFL's   Elbow flexion 130*   Elbow extension -43*   Wrist pronation unable   Wrist supination unable   (Blank rows = not tested)   UPPER EXTREMITY MMT:   Not tested d/t pain and precautions at wrist/forearm  HAND FUNCTION: 09/23/2024: Grip strength: Right: 9.9 lbs; Left: 59 lbs 10/07/24: Rt = 19.4 lbs  COORDINATION: Unable to pick up anything small Rt hand d/t limited ROM  SENSATION: Not tested  EDEMA: Mild at fingers  COGNITION: Overall cognitive status: pt limited by pain and restless/anxious. Difficulty focusing on therapist when trying to review and update HEP  Areas of impairment: attention, behavior  OBSERVATIONS: Pt arrived in cast directly from MD office. Pin removed and casted prior to this O.T. evaluation  TREATMENT:  - Therapeutic exercises completed for duration as noted below including:  Patient tolerated paraffin bath to R hand in preparation for ROM, strength and  functional activities AND as trial for home modality consideration.  No redness, irritation or skin integrity concerns were noted before, during and/or after use.   Skin integrity prior to treatment: Intact. Skin integrity after treatment: Intact.  During application, OT completed R wrist extension and flexion as well as digit flexion PROM to help improve ROM.   - Ultrasound completed for duration as noted below including:  Ultrasound applied to palmar and dorsal right hand, digits, and wrist for 8 minutes, frequency of 3 MHz, 20% duty cycle, and 1.1 W/cm with pt's arm placed on soft towel for promotion of ROM, edema reduction, and pain reduction in affected extremity. PATIENT EDUCATION: Education details: see above Person educated: Patient Education method: Explanation, Demonstration, and Verbal cues Education comprehension: verbalized understanding, returned demonstration, verbal cues required, and needs further education  HOME EXERCISE PROGRAM: 09/09/24: reprint of 07/29/24 HEP except last 3 exercises, verbally instructed to add PROM to fingers/thumb, and shoulder ROM ex's 09/23/2024: putty and hammer HEP 10/07/24: updated HEP  10/15/2024: [employer] instructions  GOALS: Goals reviewed with patient? Yes  SHORT TERM GOALS: Target date: 11/24/2024  Independent with appropriate HEP following recent sx Baseline: issued, will need updates/modifications when cast comes off Goal status: IN PROGRESS  2.  Pt to improve elbow extension to -30* or less RUE Baseline: -  43* Goal status: IN PROGRESS  3.  Pt to have 75% or greater composite flexion Rt hand for gross grasp/release Baseline: less than 50% Goal status: IN PROGRESS  4.  Pt to report pain less than or equal to 5/10 with HEP Baseline: 10/10 Goal status: IN PROGRESS  5.  Pt to oppose thumb to ring finger for functional thumb use/movement Baseline: max difficulty to index finger Goal status: IN PROGRESS  6.  Pt to use RUE as min  assist for bilateral tasks Baseline: dependent at this time Goal status: INITIAL  LONG TERM GOALS: Target date: 12/31/24  Independent with updated P/ROM and strengthening HEP  Baseline: not yet issued d/t current precautions/cast Goal status: INITIAL  2.  Pt to demo 30* or greater wrist flex/ext RUE for functional use Baseline: unable to assess d/t cast Goal status: IN PROGRESS  3.  Pt to demo 45* forearm pronation and supination RUE Baseline: Neutral - unable at this time d/t stiffness and pain Goal status: IN PROGRESS  4.  RUE elbow flexion to 140* and extension to -25* or less Baseline: 130*, -43*  Goal status: IN PROGRESS  5.  Pt to return to using Rt hand for eating, grooming, and writing 90% or more of the time Baseline: currently using Lt non dominant hand Goal status: IN PROGRESS  6.  Quick Dash to improve to 45% or less deficit RUE Baseline: 68.18% Goal status: IN PROGRESS  ASSESSMENT:  CLINICAL IMPRESSION: Pt demonstrates good tolerance to therapy today to improve RUE ROM, pain, and overall functional use. Pain and symptoms seem most consistent with brace weaning and increased functional use. Continue with strengthening and pain management as tolerated.  PERFORMANCE DEFICITS: in functional skills including ADLs, IADLs, coordination, dexterity, proprioception, sensation, edema, ROM, strength, pain, Fine motor control, Gross motor control, body mechanics, decreased knowledge of precautions, decreased knowledge of use of DME, and UE functional use, cognitive skills including emotional, and psychosocial skills including coping strategies.   IMPAIRMENTS: are limiting patient from ADLs, IADLs, rest and sleep, work, and leisure.   COMORBIDITIES: may have co-morbidities  that affects occupational performance. Patient will benefit from skilled OT to address above impairments and improve overall function.  REHAB POTENTIAL: Fair limited by pain, significant stiffness, time since  onset of initial injury  PLAN:  OT FREQUENCY: 1-2x/week  OT DURATION: 6 weeks  PLANNED INTERVENTIONS: 97535 self care/ADL training, 02889 therapeutic exercise, 97530 therapeutic activity, 97140 manual therapy, 97113 aquatic therapy, 97035 ultrasound, 97018 paraffin, 02960 fluidotherapy, 97010 moist heat, 97010 cryotherapy, 97034 contrast bath, 97032 electrical stimulation (manual), 97014 electrical stimulation unattended, 97760 Orthotic Initial, 97763 Orthotic/Prosthetic subsequent, scar mobilization, passive range of motion, compression bandaging, coping strategies training, patient/family education, and DME and/or AE instructions  RECOMMENDED OTHER SERVICES: none at this time  CONSULTED AND AGREED WITH PLAN OF CARE: Patient  PLAN FOR NEXT SESSION: assess goals, taping?  For all possible CPT codes, reference the Planned Interventions line above.     Check all conditions that are expected to impact treatment: {Conditions expected to impact treatment:Neurological condition and/or seizures and Psychological or psychiatric disorders   If treatment provided at initial evaluation, no treatment charged due to lack of authorization.        Jocelyn CHRISTELLA Bottom, OT 12/16/2024, 4:37 PM   "

## 2024-12-20 ENCOUNTER — Other Ambulatory Visit: Payer: Self-pay | Admitting: Orthopedic Surgery

## 2024-12-21 ENCOUNTER — Ambulatory Visit: Admitting: Occupational Therapy

## 2024-12-23 ENCOUNTER — Ambulatory Visit: Admitting: Occupational Therapy

## 2024-12-28 ENCOUNTER — Ambulatory Visit: Admitting: Occupational Therapy

## 2024-12-30 ENCOUNTER — Encounter: Payer: Self-pay | Admitting: Occupational Therapy

## 2024-12-30 ENCOUNTER — Ambulatory Visit: Admitting: Occupational Therapy

## 2024-12-30 NOTE — Therapy (Signed)
 OCCUPATIONAL THERAPY DISCHARGE SUMMARY  Visits from Start of Care: 11  Pt contacted clinic and cancelled last scheduled OT visit. To date, she has missed more than 10 therapy visits.   Current functional level related to goals / functional outcomes: Unable to assess goals.   Remaining deficits: Ongoing impairments, see tx notes for additional details.   Education / Equipment: Pt has some needed materials and education. See tx notes for more details.    Patient goals were unable to be assessed. Patient is being discharged due to attendance policy and end of POC.   If additional OT services are recommended, a new OT referral will be required.   Recommend pt continue with current HEP, pain management strategies, and weaning of R wrist cock up splint for further remediation efforts.

## 2025-02-02 ENCOUNTER — Ambulatory Visit: Admitting: Orthopedic Surgery
